# Patient Record
Sex: Female | Born: 1945 | Race: Black or African American | Hispanic: No | Marital: Married | Smoking: Never smoker
Health system: Southern US, Community
[De-identification: ages and names within clinical notes are randomized; demographics above are authoritative.]

## PROBLEM LIST (undated history)

## (undated) DIAGNOSIS — J3489 Other specified disorders of nose and nasal sinuses: Secondary | ICD-10-CM

## (undated) DIAGNOSIS — E785 Hyperlipidemia, unspecified: Secondary | ICD-10-CM

## (undated) DIAGNOSIS — M1990 Unspecified osteoarthritis, unspecified site: Secondary | ICD-10-CM

## (undated) DIAGNOSIS — Z9889 Other specified postprocedural states: Secondary | ICD-10-CM

## (undated) DIAGNOSIS — M8580 Other specified disorders of bone density and structure, unspecified site: Secondary | ICD-10-CM

## (undated) DIAGNOSIS — E0590 Thyrotoxicosis, unspecified without thyrotoxic crisis or storm: Secondary | ICD-10-CM

## (undated) DIAGNOSIS — G5601 Carpal tunnel syndrome, right upper limb: Secondary | ICD-10-CM

## (undated) DIAGNOSIS — I358 Other nonrheumatic aortic valve disorders: Secondary | ICD-10-CM

## (undated) DIAGNOSIS — R112 Nausea with vomiting, unspecified: Secondary | ICD-10-CM

## (undated) DIAGNOSIS — H269 Unspecified cataract: Secondary | ICD-10-CM

## (undated) HISTORY — DX: Thyrotoxicosis, unspecified without thyrotoxic crisis or storm: E05.90

## (undated) HISTORY — PX: EYE SURGERY: SHX253

## (undated) HISTORY — DX: Hyperlipidemia, unspecified: E78.5

## (undated) HISTORY — DX: Unspecified cataract: H26.9

## (undated) HISTORY — DX: Other nonrheumatic aortic valve disorders: I35.8

## (undated) HISTORY — DX: Unspecified osteoarthritis, unspecified site: M19.90

## (undated) HISTORY — PX: ABDOMINAL HYSTERECTOMY: SHX81

## (undated) HISTORY — DX: Other specified disorders of bone density and structure, unspecified site: M85.80

## (undated) HISTORY — PX: DENTAL SURGERY: SHX609

---

## 1974-07-14 HISTORY — PX: TONSILLECTOMY: SUR1361

## 2000-07-14 HISTORY — PX: COLONOSCOPY: SHX174

## 2003-07-15 HISTORY — PX: COLONOSCOPY: SHX174

## 2003-07-15 HISTORY — PX: ESOPHAGOGASTRODUODENOSCOPY: SHX1529

## 2006-07-14 HISTORY — PX: TOTAL ABDOMINAL HYSTERECTOMY W/ BILATERAL SALPINGOOPHORECTOMY: SHX83

## 2008-07-14 HISTORY — PX: BREAST EXCISIONAL BIOPSY: SUR124

## 2008-07-14 HISTORY — PX: BREAST BIOPSY: SHX20

## 2011-07-15 HISTORY — PX: CATARACT EXTRACTION: SUR2

## 2011-08-13 LAB — COMPREHENSIVE METABOLIC PANEL
Albumin: 4
BUN: 17 mg/dL (ref 4–21)
Calcium: 9.6 mg/dL
Creat: 0.91
Potassium: 4.3 mmol/L
Total Bilirubin: 0.4 mg/dL
Total Protein: 6.7 g/dL

## 2011-08-13 LAB — LIPID PANEL
Cholesterol, Total: 208
Direct LDL: 126
HDL: 56 mg/dL (ref 35–70)
Triglycerides: 128

## 2011-08-13 LAB — LACTATE DEHYDROGENASE: LDH: 172 U/L (ref 80–250)

## 2011-08-13 LAB — TSH: TSH: 1.31

## 2011-08-13 LAB — GAMMA GT: GGT: 19 U/L (ref 10–52)

## 2011-08-13 LAB — CBC
Hemoglobin: 13.6 g/dL (ref 12.0–16.0)
RBC: 4.3
WBC: 6.1

## 2011-08-13 LAB — GLUCOSE, RANDOM

## 2011-11-25 ENCOUNTER — Encounter: Payer: Self-pay | Admitting: Family Medicine

## 2011-11-25 ENCOUNTER — Ambulatory Visit (INDEPENDENT_AMBULATORY_CARE_PROVIDER_SITE_OTHER): Payer: Managed Care, Other (non HMO) | Admitting: Family Medicine

## 2011-11-25 VITALS — BP 130/84 | HR 68 | Temp 98.1°F | Ht 59.5 in | Wt 139.8 lb

## 2011-11-25 DIAGNOSIS — E785 Hyperlipidemia, unspecified: Secondary | ICD-10-CM

## 2011-11-25 DIAGNOSIS — M85852 Other specified disorders of bone density and structure, left thigh: Secondary | ICD-10-CM | POA: Insufficient documentation

## 2011-11-25 DIAGNOSIS — M810 Age-related osteoporosis without current pathological fracture: Secondary | ICD-10-CM

## 2011-11-25 DIAGNOSIS — M8580 Other specified disorders of bone density and structure, unspecified site: Secondary | ICD-10-CM | POA: Insufficient documentation

## 2011-11-25 NOTE — Progress Notes (Signed)
Subjective:    Patient ID: Kelsey Yoder, female    DOB: 20-Sep-1945, 66 y.o.   MRN: [id number]  HPI CC: new pt, establish  Moved from [place] 05/2011.  Prior saw Dr. Jocelyn Lamer, requested records to be faxed.  Will sign ROI form.  Reviewed records she brings today - mainly recent blood work and recent mammogram studies.  No questions or concerns today.  Osteoporosis- takes calcium and vitamin D - see med list.  On estrogen since 2008.  On actonel for last 5 years as well.  No hip pain.  No fractures in past.  No jaw or hip pain.  Caffeine: occasional Lives alone, no pets Occupation: retired, was in Research scientist (life sciences) Edu: master's Activity: goes to gym 3-4x/wk Diet: some water, fruits and vegetables daily  Preventative: Well woman with OBGYN.  Has not established with OBGYN yet.  Last well woman 08/2011.   Last mamogram 08/14/2011, normal. H/o abnormal paps, had hysterectomy 2008. Last CPE was done 2 yrs ago. Tetanus shot - done 2010. Colonoscopy x2 - unsure when done, told didn't need for 15 yrs.  thinks last checked 2010.  Medications and allergies reviewed and updated in chart.  Past histories reviewed and updated if relevant as below. There is no problem list on file for this patient.  Past Medical History  Diagnosis Date  . Arthritis     finger pain  . History of chicken pox   . Heart murmur     s/p normal ETT per pt  . Mild hyperlipidemia   . Osteoporosis     due for DEXA scan 2014, currently on actonel and estrogen   Past Surgical History  Procedure Date  . Breast biopsy 2010    Right; negative  . Tonsillectomy 1976  . Total abdominal hysterectomy 2008    abnormal paps   History  Substance Use Topics  . Smoking status: Never Smoker   . Smokeless tobacco: Never Used  . Alcohol Use: No   Family History  Problem Relation Age of Onset  . Diabetes Mother   . Diabetes Father   . Stroke Sister   . Lupus Sister     x2  . Diabetes Brother   .  Cancer Brother     duodenal cancer, prostate cancer  . Stroke Maternal Grandmother   . Stroke Maternal Grandfather   . Coronary artery disease Neg Hx    No Known Allergies Current Outpatient Prescriptions on File Prior to Visit  Medication Sig Dispense Refill  . Calcium Carb-Cholecalciferol 600-800 MG-UNIT TABS Take 1 tablet by mouth daily.      Marland Kitchen estrogens conjugated, synthetic B, (ENJUVIA) 0.625 MG tablet Take 0.625 mg by mouth daily.         Review of Systems  Constitutional: Negative for fever, chills, activity change, appetite change, fatigue and unexpected weight change.  HENT: Negative for hearing loss and neck pain.   Eyes: Negative for visual disturbance.  Respiratory: Negative for cough, chest tightness, shortness of breath and wheezing.   Cardiovascular: Negative for chest pain, palpitations and leg swelling.  Gastrointestinal: Negative for nausea, vomiting, abdominal pain, diarrhea, constipation, blood in stool and abdominal distention.  Genitourinary: Negative for hematuria and difficulty urinating.  Musculoskeletal: Negative for myalgias and arthralgias.  Skin: Negative for rash.  Neurological: Negative for dizziness, seizures, syncope and headaches.  Hematological: Does not bruise/bleed easily.  Psychiatric/Behavioral: Negative for dysphoric mood. The patient is not nervous/anxious.        Objective:  Physical Exam  Nursing note and vitals reviewed. Constitutional: She is oriented to person, place, and time. She appears well-developed and well-nourished. No distress.  HENT:  Head: Normocephalic and atraumatic.  Right Ear: External ear normal.  Left Ear: External ear normal.  Nose: Nose normal.  Mouth/Throat: Oropharynx is clear and moist. No oropharyngeal exudate.  Eyes: Conjunctivae and EOM are normal. Pupils are equal, round, and reactive to light. No scleral icterus.  Neck: Normal range of motion. Neck supple. Carotid bruit is not present.  Cardiovascular:  Normal rate, regular rhythm, normal heart sounds and intact distal pulses.   No murmur heard. Pulses:      Radial pulses are 2+ on the right side, and 2+ on the left side.  Pulmonary/Chest: Effort normal and breath sounds normal. No respiratory distress. She has no wheezes. She has no rales.  Abdominal: Soft. Bowel sounds are normal. She exhibits no distension and no mass. There is no tenderness. There is no rebound and no guarding.  Musculoskeletal: Normal range of motion. She exhibits no edema.  Lymphadenopathy:    She has no cervical adenopathy.  Neurological: She is alert and oriented to person, place, and time.       CN grossly intact, station and gait intact  Skin: Skin is warm and dry. No rash noted.  Psychiatric: She has a normal mood and affect. Her behavior is normal. Judgment and thought content normal.      Assessment & Plan:

## 2011-11-25 NOTE — Assessment & Plan Note (Signed)
Controlled on fish oil. Total chol 208 on last check 07/2011.  Asked to scan results into system.

## 2011-11-25 NOTE — Assessment & Plan Note (Addendum)
On bisphosphonate and estrogen for last 5 years.  States h/o osteopenia, not osteoporosis. Will request records from prior PCP.  May be able to come off estrogens. Discussed risks and common side effects of both meds.

## 2011-11-25 NOTE — Patient Instructions (Addendum)
Great to meet you today, call us with questions. Return in 6 months for physical, prior fasting for blood work. We will request records from Dr. Devoria Glassing in [place]. continue meds as up to now.

## 2011-12-27 ENCOUNTER — Encounter: Payer: Self-pay | Admitting: Family Medicine

## 2011-12-27 DIAGNOSIS — I358 Other nonrheumatic aortic valve disorders: Secondary | ICD-10-CM | POA: Insufficient documentation

## 2011-12-27 DIAGNOSIS — E559 Vitamin D deficiency, unspecified: Secondary | ICD-10-CM | POA: Insufficient documentation

## 2012-01-05 ENCOUNTER — Telehealth: Payer: Self-pay | Admitting: Family Medicine

## 2012-01-05 NOTE — Telephone Encounter (Signed)
Caller: Mao/Patient; PCP: Eustaquio Boyden; CB#: [phone]; ; ; Call regarding Bite On Leg;  Onset- 01/02/2012 Afebrile. Pt has a bite on her left  leg that is red, swollen, and tender to touch. She states it is ? the  size of a  small lemon in swelling. Only small portion on top is red. She unsure of what bit her. Emergent s/s of Bites and stings protocol r/o. Pt to see provider within 24hrs. Appt scheduled for 12:15 tomorrow with Dr. Sharen Hones. Pt states she is up to date with Tetanus.

## 2012-01-05 NOTE — Telephone Encounter (Signed)
Noted. Will see tomorrow. 

## 2012-01-06 ENCOUNTER — Ambulatory Visit (INDEPENDENT_AMBULATORY_CARE_PROVIDER_SITE_OTHER): Payer: Managed Care, Other (non HMO) | Admitting: Family Medicine

## 2012-01-06 ENCOUNTER — Encounter: Payer: Self-pay | Admitting: Family Medicine

## 2012-01-06 VITALS — BP 125/75 | HR 97 | Temp 98.1°F | Wt 138.0 lb

## 2012-01-06 DIAGNOSIS — R600 Localized edema: Secondary | ICD-10-CM | POA: Insufficient documentation

## 2012-01-06 DIAGNOSIS — R609 Edema, unspecified: Secondary | ICD-10-CM

## 2012-01-06 MED ORDER — CEPHALEXIN 500 MG PO CAPS: 500.0000 mg | ORAL_CAPSULE | Freq: Three times a day (TID) | ORAL | Status: AC

## 2012-01-06 NOTE — Assessment & Plan Note (Addendum)
After recent bug bites, anticipate mosquitoes as around standing water. Discussed draining standing water. I am more suspicious for localized inflammatory reaction to bug bite than for infection/cellulitis. Discussed this.  Supportive care as per instructions Red flags to watch discussed, if worsening, fill keflex course (WASP provided).  Return if needed (ie worsening despite abx)

## 2012-01-06 NOTE — Patient Instructions (Signed)
I don't think there's infection, but more likely inflammation after bug bites. Treat by elevating leg as much as able during day, cold compresses or ice pack to leg, and ibuprofen for swelling. If spreading redness, pain worse, or warmth, start antibiotic. If worsening instead of improving despite above, return to see [place].

## 2012-01-06 NOTE — Progress Notes (Signed)
  Subjective:    Patient ID: Kelsey Yoder, female    DOB: [birth year], 66 y.o.   MRN: [id number]  HPI CC: leg swelling  hsa been working outside in yard  Friday night noticed bites on lower legs, wonders if mosquito bites.  Some itchy, now more sore and tight/swollen.  L > R lower legs.  No warmth, mild redness.    Does have standing water in yard in form of coy pond - thinking about getting rid of this  No fevers/chills.  Review of Systems Per HPI    Objective:   Physical Exam  Nursing note and vitals reviewed. Constitutional: She appears well-developed and well-nourished. No distress.  Cardiovascular:  Pulses:      Dorsalis pedis pulses are 2+ on the right side, and 2+ on the left side.       Posterior tibial pulses are 2+ on the right side, and 2+ on the left side.  Musculoskeletal: She exhibits no edema.  Skin: Skin is warm and dry. There is erythema.       Mild erythema and nonpitting edema of left lower leg anterior shin.  No calor.  Mild tender to palpation.       Assessment & Plan:

## 2012-05-19 ENCOUNTER — Other Ambulatory Visit: Payer: Self-pay | Admitting: Family Medicine

## 2012-05-19 DIAGNOSIS — M8580 Other specified disorders of bone density and structure, unspecified site: Secondary | ICD-10-CM

## 2012-05-19 DIAGNOSIS — E559 Vitamin D deficiency, unspecified: Secondary | ICD-10-CM

## 2012-05-19 DIAGNOSIS — E785 Hyperlipidemia, unspecified: Secondary | ICD-10-CM

## 2012-05-24 ENCOUNTER — Other Ambulatory Visit (INDEPENDENT_AMBULATORY_CARE_PROVIDER_SITE_OTHER): Payer: Managed Care, Other (non HMO)

## 2012-05-24 DIAGNOSIS — E559 Vitamin D deficiency, unspecified: Secondary | ICD-10-CM

## 2012-05-24 DIAGNOSIS — E785 Hyperlipidemia, unspecified: Secondary | ICD-10-CM

## 2012-05-24 DIAGNOSIS — M949 Disorder of cartilage, unspecified: Secondary | ICD-10-CM

## 2012-05-24 DIAGNOSIS — M8580 Other specified disorders of bone density and structure, unspecified site: Secondary | ICD-10-CM

## 2012-05-24 LAB — BASIC METABOLIC PANEL
Chloride: 105 mEq/L (ref 96–112)
GFR: 79 mL/min (ref >60.00)
Glucose, Bld: 90 mg/dL (ref 70–99)
Potassium: 4 mEq/L (ref 3.5–5.1)
Sodium: 140 mEq/L (ref 135–145)

## 2012-05-24 LAB — LIPID PANEL: Total CHOL/HDL Ratio: 5

## 2012-05-24 LAB — LDL CHOLESTEROL, DIRECT: Direct LDL: 151.4 mg/dL

## 2012-05-27 ENCOUNTER — Ambulatory Visit: Payer: Managed Care, Other (non HMO) | Admitting: Family Medicine

## 2012-05-27 ENCOUNTER — Encounter: Payer: Self-pay | Admitting: Family Medicine

## 2012-05-27 ENCOUNTER — Ambulatory Visit (INDEPENDENT_AMBULATORY_CARE_PROVIDER_SITE_OTHER): Payer: Managed Care, Other (non HMO) | Admitting: Family Medicine

## 2012-05-27 VITALS — BP 136/80 | HR 60 | Temp 97.9°F | Ht 59.5 in | Wt 138.2 lb

## 2012-05-27 DIAGNOSIS — Z23 Encounter for immunization: Secondary | ICD-10-CM

## 2012-05-27 DIAGNOSIS — E785 Hyperlipidemia, unspecified: Secondary | ICD-10-CM

## 2012-05-27 DIAGNOSIS — E559 Vitamin D deficiency, unspecified: Secondary | ICD-10-CM

## 2012-05-27 DIAGNOSIS — M949 Disorder of cartilage, unspecified: Secondary | ICD-10-CM

## 2012-05-27 DIAGNOSIS — Z0001 Encounter for general adult medical examination with abnormal findings: Secondary | ICD-10-CM | POA: Insufficient documentation

## 2012-05-27 DIAGNOSIS — M8580 Other specified disorders of bone density and structure, unspecified site: Secondary | ICD-10-CM

## 2012-05-27 DIAGNOSIS — Z0000 Encounter for general adult medical examination without abnormal findings: Secondary | ICD-10-CM

## 2012-05-27 NOTE — Assessment & Plan Note (Signed)
Discussed dietary changes to lower LDL.

## 2012-05-27 NOTE — Assessment & Plan Note (Signed)
On actonel. Stopped estrogen, was on for 5 yrs prior. Due for DEXA 2014.

## 2012-05-27 NOTE — Assessment & Plan Note (Signed)
Good levels, continue regimen.

## 2012-05-27 NOTE — Progress Notes (Signed)
Subjective:    Patient ID: Kelsey Yoder, female    DOB: 1946/02/29, 66 y.o.   MRN: [id number]  HPI CC: annual exam  Osteopenia - taking calcium and vitamin D.  Also on actonel.  Due for DEXA scan 2014.  Previously on estrogen, stopped when unable to get prior formulation of estrogen.  Preventative: Well woman with OBGYN. Has not established with OBGYN yet. Last well woman 08/2011.  Last mamogram 08/14/2011, normal.  H/o abnormal paps, had hysterectomy 2008. Continues to get pap smears.  Tetanus shot - done 2010.  Flu shot - today. Colonoscopy - unsure when done, told didn't need for 15 yrs. thinks last checked 2010.   Denies depression/anhedonia, sadness.  Seat belt use discussed  Medications and allergies reviewed and updated in chart.  Past histories reviewed and updated if relevant as below. Patient Active Problem List  Diagnosis  . Mild hyperlipidemia  . Osteopenia  . Aortic valve sclerosis  . Vitamin d deficiency  . Leg edema   Past Medical History  Diagnosis Date  . Arthritis     + ANA, "borderline rheumatism"  . History of chicken pox   . Aortic valve sclerosis     s/p normal ETT per pt  . Mild hyperlipidemia   . Osteopenia     due for DEXA scan 2014, currently on actonel and estrogen  . Vitamin D deficiency   . Rhinitis    Past Surgical History  Procedure Date  . Breast biopsy 2010    Right; benign fibrocystic  changes  . Tonsillectomy 1976  . Total abdominal hysterectomy w/ bilateral salpingoophorectomy 2008    abnormal cervical cells, has seen GYN  . Colonoscopy 2008    next due 2015 per reports  . US echocardiography 04/2009    nl sys fxn, aortic valve sclerosis,mild impaired diastolic dysfunction  . Cataract extraction 12/2011    left then right   History  Substance Use Topics  . Smoking status: Never Smoker   . Smokeless tobacco: Never Used  . Alcohol Use: No   Family History  Problem Relation Age of Onset  . Diabetes Mother    double amputee  . Diabetes Father   . Stroke Sister   . Lupus Sister     x2  . Diabetes Brother   . Cancer Brother     duodenal cancer, prostate cancer  . Stroke Maternal Grandmother   . Stroke Maternal Grandfather   . Coronary artery disease Neg Hx   . Heart disease Mother 75    CHF   No Known Allergies Current Outpatient Prescriptions on File Prior to Visit  Medication Sig Dispense Refill  . Calcium Carb-Cholecalciferol 600-800 MG-UNIT TABS Take 1 tablet by mouth daily.      . Cholecalciferol (VITAMIN D3) 5000 UNITS CAPS Take 1 capsule by mouth daily.      . Cyanocobalamin (VITAMIN B 12 PO) Take 1,000 mcg by mouth daily.      . fish oil-omega-3 fatty acids 1000 MG capsule Take 1 g by mouth daily.      . Multiple Vitamin (MULTIVITAMIN) tablet Take 1 tablet by mouth daily.      . risedronate (ACTONEL) 150 MG tablet Take 150 mg by mouth every 30 (thirty) days. with water on empty stomach, nothing by mouth or lie down for next 30 minutes.         Review of Systems  Constitutional: Negative for fever, chills, activity change, appetite change, fatigue and unexpected weight change.  HENT: Negative for hearing loss and neck pain.   Eyes: Negative for visual disturbance.  Respiratory: Negative for cough, chest tightness, shortness of breath and wheezing.   Cardiovascular: Negative for chest pain, palpitations and leg swelling.  Gastrointestinal: Negative for nausea, vomiting, abdominal pain, diarrhea, constipation, blood in stool and abdominal distention.  Genitourinary: Negative for hematuria and difficulty urinating.  Musculoskeletal: Negative for myalgias and arthralgias.  Skin: Negative for rash.  Neurological: Negative for dizziness, seizures, syncope and headaches.  Hematological: Does not bruise/bleed easily.  Psychiatric/Behavioral: Negative for dysphoric mood. The patient is not nervous/anxious.        Objective:   Physical Exam  Nursing note and vitals  reviewed. Constitutional: She is oriented to person, place, and time. She appears well-developed and well-nourished. No distress.  HENT:  Head: Normocephalic and atraumatic.  Right Ear: Hearing, tympanic membrane, external ear and ear canal normal.  Left Ear: Hearing, tympanic membrane, external ear and ear canal normal.  Nose: Nose normal.  Mouth/Throat: Oropharynx is clear and moist. No oropharyngeal exudate.  Eyes: Conjunctivae normal and EOM are normal. Pupils are equal, round, and reactive to light. No scleral icterus.  Neck: Normal range of motion. Neck supple. Carotid bruit is not present.  Cardiovascular: Normal rate, regular rhythm, normal heart sounds and intact distal pulses.   No murmur heard. Pulses:      Radial pulses are 2+ on the right side, and 2+ on the left side.  Pulmonary/Chest: Effort normal and breath sounds normal. No respiratory distress. She has no wheezes. She has no rales.       Breast exam deferred for OBGYN  Abdominal: Soft. Bowel sounds are normal. She exhibits no distension and no mass. There is no tenderness. There is no rebound and no guarding.  Genitourinary:       Deferred for OBGYN  Musculoskeletal: Normal range of motion. She exhibits no edema.  Lymphadenopathy:    She has no cervical adenopathy.  Neurological: She is alert and oriented to person, place, and time.       CN grossly intact, station and gait intact  Skin: Skin is warm and dry. No rash noted.  Psychiatric: She has a normal mood and affect. Her behavior is normal. Judgment and thought content normal.       Assessment & Plan:

## 2012-05-27 NOTE — Patient Instructions (Signed)
Flu shot today. Schedule appointment for well woman with OBGYN for early next year, as well as for mammogram. Due for bone density scan next year.

## 2012-05-27 NOTE — Assessment & Plan Note (Signed)
Preventative protocols reviewed and updated unless pt declined. Discussed healthy diet and lifestyle.  

## 2012-09-13 ENCOUNTER — Encounter: Payer: Self-pay | Admitting: Obstetrics & Gynecology

## 2012-09-13 ENCOUNTER — Ambulatory Visit (INDEPENDENT_AMBULATORY_CARE_PROVIDER_SITE_OTHER): Payer: Private Health Insurance - Indemnity | Admitting: Obstetrics & Gynecology

## 2012-09-13 VITALS — BP 130/69 | HR 72 | Ht 59.75 in | Wt 138.0 lb

## 2012-09-13 DIAGNOSIS — M8580 Other specified disorders of bone density and structure, unspecified site: Secondary | ICD-10-CM

## 2012-09-13 DIAGNOSIS — Z01419 Encounter for gynecological examination (general) (routine) without abnormal findings: Secondary | ICD-10-CM

## 2012-09-13 DIAGNOSIS — Z1231 Encounter for screening mammogram for malignant neoplasm of breast: Secondary | ICD-10-CM

## 2012-09-13 MED ORDER — RISEDRONATE SODIUM 150 MG PO TABS: 150.0000 mg | ORAL_TABLET | ORAL | Status: DC

## 2012-09-13 NOTE — Progress Notes (Signed)
  Subjective:    Kelsey Yoder is a 67 y.o. female who presents to discuss hormone replacement therapy. Patient has been on  hormone replacement therapy due to hysterectomy with BSO. The patient is not currently taking hormone replacement therapy. Patient denies post-menopausal vaginal bleeding. The patient is not sexually active. No other GYN concerns, no menopausal symptoms.  Previous hormone therapy:  Estrogen: Premarin 0.625 mg daily Reason for starting HRT: hysterectomy with BSO Bleeding in past on HRT: None  The following portions of the patient's history were reviewed and updated as appropriate: allergies, current medications, past family history, past medical history, past social history, past surgical history and problem list.  Review of Systems Pertinent items are noted in HPI.    Objective:   BP 130/69  Pulse 72  Ht 4' 11.75" (1.518 m)  Wt 138 lb (62.596 kg)  BMI 27.16 kg/m2 GENERAL: Well-developed, well-nourished female in no acute distress.  HEENT: Normocephalic, atraumatic. Sclerae anicteric.  NECK: Supple. Normal thyroid.  LUNGS: Clear to auscultation bilaterally.  HEART: Regular rate and rhythm. BREASTS: Symmetric in size. No masses, skin changes, nipple drainage, or lymphadenopathy. ABDOMEN: Soft, nontender, nondistended. No organomegaly. PELVIC: Normal external female genitalia. Vagina is with significant atrophy, well-healed vaginal cuff.  Normal discharge. No masses palpated on bimanual exam EXTREMITIES: No cyanosis, clubbing, or edema, 2+ distal pulses.   Assessment:   Normal annual gynecologic exam  Hormone replacement therapy in 67 y.o. woman.    Plan:  No need for pap, TAH/BSO not done for cancer (done for abnormal pap smears); normal yearly paps of vaginal cuff since 2008. Mammogram scheduled; also scheduled DEXA scan given history of osteopenia; Actonel refilled as per patient's request Patient will stop HRT, no refills given, will monitor  symptoms and treat if needed with nonhormonal methods Routine preventative health maintenance measures emphasized

## 2012-09-13 NOTE — Patient Instructions (Signed)
Preventive Care for Adults, Female A healthy lifestyle and preventive care can promote health and wellness. Preventive health guidelines for women include the following key practices.  A routine yearly physical is a good way to check with your caregiver about your health and preventive screening. It is a chance to share any concerns and updates on your health, and to receive a thorough exam.  Visit your dentist for a routine exam and preventive care every 6 months. Brush your teeth twice a day and floss once a day. Good oral hygiene prevents tooth decay and gum disease.  The frequency of eye exams is based on your age, health, family medical history, use of contact lenses, and other factors. Follow your caregiver's recommendations for frequency of eye exams.  Eat a healthy diet. Foods like vegetables, fruits, whole grains, low-fat dairy products, and lean protein foods contain the nutrients you need without too many calories. Decrease your intake of foods high in solid fats, added sugars, and salt. Eat the right amount of calories for you.Get information about a proper diet from your caregiver, if necessary.  Regular physical exercise is one of the most important things you can do for your health. Most adults should get at least 150 minutes of moderate-intensity exercise (any activity that increases your heart rate and causes you to sweat) each week. In addition, most adults need muscle-strengthening exercises on 2 or more days a week.  Maintain a healthy weight. The body mass index (BMI) is a screening tool to identify possible weight problems. It provides an estimate of body fat based on height and weight. Your caregiver can help determine your BMI, and can help you achieve or maintain a healthy weight.For adults 20 years and older:  A BMI below 18.5 is considered underweight.  A BMI of 18.5 to 24.9 is normal.  A BMI of 25 to 29.9 is considered overweight.  A BMI of 30 and above is  considered obese.  Maintain normal blood lipids and cholesterol levels by exercising and minimizing your intake of saturated fat. Eat a balanced diet with plenty of fruit and vegetables. Blood tests for lipids and cholesterol should begin at age 20 and be repeated every 5 years. If your lipid or cholesterol levels are high, you are over 50, or you are at high risk for heart disease, you may need your cholesterol levels checked more frequently.Ongoing high lipid and cholesterol levels should be treated with medicines if diet and exercise are not effective.  If you smoke, find out from your caregiver how to quit. If you do not use tobacco, do not start.  If you are pregnant, do not drink alcohol. If you are breastfeeding, be very cautious about drinking alcohol. If you are not pregnant and choose to drink alcohol, do not exceed 1 drink per day. One drink is considered to be 12 ounces (355 mL) of beer, 5 ounces (148 mL) of wine, or 1.5 ounces (44 mL) of liquor.  Avoid use of street drugs. Do not share needles with anyone. Ask for help if you need support or instructions about stopping the use of drugs.  High blood pressure causes heart disease and increases the risk of stroke. Your blood pressure should be checked at least every 1 to 2 years. Ongoing high blood pressure should be treated with medicines if weight loss and exercise are not effective.  If you are 55 to 65 years old, ask your caregiver if you should take aspirin to prevent strokes.  Diabetes   screening involves taking a blood sample to check your fasting blood sugar level. This should be done once every 3 years, after age 45, if you are within normal weight and without risk factors for diabetes. Testing should be considered at a younger age or be carried out more frequently if you are overweight and have at least 1 risk factor for diabetes.  Breast cancer screening is essential preventive care for women. You should practice "breast  self-awareness." This means understanding the normal appearance and feel of your breasts and may include breast self-examination. Any changes detected, no matter how small, should be reported to a caregiver. Women in their 20s and 30s should have a clinical breast exam (CBE) by a caregiver as part of a regular health exam every 1 to 3 years. After age 40, women should have a CBE every year. Starting at age 40, women should consider having a mammography (breast X-ray test) every year. Women who have a family history of breast cancer should talk to their caregiver about genetic screening. Women at a high risk of breast cancer should talk to their caregivers about having magnetic resonance imaging (MRI) and a mammography every year.  The Pap test is a screening test for cervical cancer. A Pap test can show cell changes on the cervix that might become cervical cancer if left untreated. A Pap test is a procedure in which cells are obtained and examined from the lower end of the uterus (cervix).  Women should have a Pap test starting at age 21.  Between ages 21 and 29, Pap tests should be repeated every 2 years.  Beginning at age 30, you should have a Pap test every 3 years as long as the past 3 Pap tests have been normal.  Some women have medical problems that increase the chance of getting cervical cancer. Talk to your caregiver about these problems. It is especially important to talk to your caregiver if a new problem develops soon after your last Pap test. In these cases, your caregiver may recommend more frequent screening and Pap tests.  The above recommendations are the same for women who have or have not gotten the vaccine for human papillomavirus (HPV).  If you had a hysterectomy for a problem that was not cancer or a condition that could lead to cancer, then you no longer need Pap tests. Even if you no longer need a Pap test, a regular exam is a good idea to make sure no other problems are  starting.  If you are between ages 65 and 70, and you have had normal Pap tests going back 10 years, you no longer need Pap tests. Even if you no longer need a Pap test, a regular exam is a good idea to make sure no other problems are starting.  If you have had past treatment for cervical cancer or a condition that could lead to cancer, you need Pap tests and screening for cancer for at least 20 years after your treatment.  If Pap tests have been discontinued, risk factors (such as a new sexual partner) need to be reassessed to determine if screening should be resumed.  The HPV test is an additional test that may be used for cervical cancer screening. The HPV test looks for the virus that can cause the cell changes on the cervix. The cells collected during the Pap test can be tested for HPV. The HPV test could be used to screen women aged 30 years and older, and should   be used in women of any age who have unclear Pap test results. After the age of 30, women should have HPV testing at the same frequency as a Pap test.  Colorectal cancer can be detected and often prevented. Most routine colorectal cancer screening begins at the age of 50 and continues through age 75. However, your caregiver may recommend screening at an earlier age if you have risk factors for colon cancer. On a yearly basis, your caregiver may provide home test kits to check for hidden blood in the stool. Use of a small camera at the end of a tube, to directly examine the colon (sigmoidoscopy or colonoscopy), can detect the earliest forms of colorectal cancer. Talk to your caregiver about this at age 50, when routine screening begins. Direct examination of the colon should be repeated every 5 to 10 years through age 75, unless early forms of pre-cancerous polyps or small growths are found.  Hepatitis C blood testing is recommended for all people born from 1945 through 1965 and any individual with known risks for hepatitis C.  Practice  safe sex. Use condoms and avoid high-risk sexual practices to reduce the spread of sexually transmitted infections (STIs). STIs include gonorrhea, chlamydia, syphilis, trichomonas, herpes, HPV, and human immunodeficiency virus (HIV). Herpes, HIV, and HPV are viral illnesses that have no cure. They can result in disability, cancer, and death. Sexually active women aged 25 and younger should be checked for chlamydia. Older women with new or multiple partners should also be tested for chlamydia. Testing for other STIs is recommended if you are sexually active and at increased risk.  Osteoporosis is a disease in which the bones lose minerals and strength with aging. This can result in serious bone fractures. The risk of osteoporosis can be identified using a bone density scan. Women ages 65 and over and women at risk for fractures or osteoporosis should discuss screening with their caregivers. Ask your caregiver whether you should take a calcium supplement or vitamin D to reduce the rate of osteoporosis.  Menopause can be associated with physical symptoms and risks. Hormone replacement therapy is available to decrease symptoms and risks. You should talk to your caregiver about whether hormone replacement therapy is right for you.  Use sunscreen with sun protection factor (SPF) of 30 or more. Apply sunscreen liberally and repeatedly throughout the day. You should seek shade when your shadow is shorter than you. Protect yourself by wearing long sleeves, pants, a wide-brimmed hat, and sunglasses year round, whenever you are outdoors.  Once a month, do a whole body skin exam, using a mirror to look at the skin on your back. Notify your caregiver of new moles, moles that have irregular borders, moles that are larger than a pencil eraser, or moles that have changed in shape or color.  Stay current with required immunizations.  Influenza. You need a dose every fall (or winter). The composition of the flu vaccine  changes each year, so being vaccinated once is not enough.  Pneumococcal polysaccharide. You need 1 to 2 doses if you smoke cigarettes or if you have certain chronic medical conditions. You need 1 dose at age 65 (or older) if you have never been vaccinated.  Tetanus, diphtheria, pertussis (Tdap, Td). Get 1 dose of Tdap vaccine if you are younger than age 65, are over 65 and have contact with an infant, are a healthcare worker, are pregnant, or simply want to be protected from whooping cough. After that, you need a Td   booster dose every 10 years. Consult your caregiver if you have not had at least 3 tetanus and diphtheria-containing shots sometime in your life or have a deep or dirty wound.  HPV. You need this vaccine if you are a woman age 26 or younger. The vaccine is given in 3 doses over 6 months.  Measles, mumps, rubella (MMR). You need at least 1 dose of MMR if you were born in 1957 or later. You may also need a second dose.  Meningococcal. If you are age 19 to 21 and a first-year college student living in a residence hall, or have one of several medical conditions, you need to get vaccinated against meningococcal disease. You may also need additional booster doses.  Zoster (shingles). If you are age 60 or older, you should get this vaccine.  Varicella (chickenpox). If you have never had chickenpox or you were vaccinated but received only 1 dose, talk to your caregiver to find out if you need this vaccine.  Hepatitis A. You need this vaccine if you have a specific risk factor for hepatitis A virus infection or you simply wish to be protected from this disease. The vaccine is usually given as 2 doses, 6 to 18 months apart.  Hepatitis B. You need this vaccine if you have a specific risk factor for hepatitis B virus infection or you simply wish to be protected from this disease. The vaccine is given in 3 doses, usually over 6 months. Preventive Services / Frequency Ages 19 to 39  Blood  pressure check.** / Every 1 to 2 years.  Lipid and cholesterol check.** / Every 5 years beginning at age 20.  Clinical breast exam.** / Every 3 years for women in their 20s and 30s.  Pap test.** / Every 2 years from ages 21 through 29. Every 3 years starting at age 30 through age 65 or 70 with a history of 3 consecutive normal Pap tests.  HPV screening.** / Every 3 years from ages 30 through ages 65 to 70 with a history of 3 consecutive normal Pap tests.  Hepatitis C blood test.** / For any individual with known risks for hepatitis C.  Skin self-exam. / Monthly.  Influenza immunization.** / Every year.  Pneumococcal polysaccharide immunization.** / 1 to 2 doses if you smoke cigarettes or if you have certain chronic medical conditions.  Tetanus, diphtheria, pertussis (Tdap, Td) immunization. / A one-time dose of Tdap vaccine. After that, you need a Td booster dose every 10 years.  HPV immunization. / 3 doses over 6 months, if you are 26 and younger.  Measles, mumps, rubella (MMR) immunization. / You need at least 1 dose of MMR if you were born in 1957 or later. You may also need a second dose.  Meningococcal immunization. / 1 dose if you are age 19 to 21 and a first-year college student living in a residence hall, or have one of several medical conditions, you need to get vaccinated against meningococcal disease. You may also need additional booster doses.  Varicella immunization.** / Consult your caregiver.  Hepatitis A immunization.** / Consult your caregiver. 2 doses, 6 to 18 months apart.  Hepatitis B immunization.** / Consult your caregiver. 3 doses usually over 6 months. Ages 40 to 64  Blood pressure check.** / Every 1 to 2 years.  Lipid and cholesterol check.** / Every 5 years beginning at age 20.  Clinical breast exam.** / Every year after age 40.  Mammogram.** / Every year beginning at age 40   and continuing for as long as you are in good health. Consult with your  caregiver.  Pap test.** / Every 3 years starting at age 30 through age 65 or 70 with a history of 3 consecutive normal Pap tests.  HPV screening.** / Every 3 years from ages 30 through ages 65 to 70 with a history of 3 consecutive normal Pap tests.  Fecal occult blood test (FOBT) of stool. / Every year beginning at age 50 and continuing until age 75. You may not need to do this test if you get a colonoscopy every 10 years.  Flexible sigmoidoscopy or colonoscopy.** / Every 5 years for a flexible sigmoidoscopy or every 10 years for a colonoscopy beginning at age 50 and continuing until age 75.  Hepatitis C blood test.** / For all people born from 1945 through 1965 and any individual with known risks for hepatitis C.  Skin self-exam. / Monthly.  Influenza immunization.** / Every year.  Pneumococcal polysaccharide immunization.** / 1 to 2 doses if you smoke cigarettes or if you have certain chronic medical conditions.  Tetanus, diphtheria, pertussis (Tdap, Td) immunization.** / A one-time dose of Tdap vaccine. After that, you need a Td booster dose every 10 years.  Measles, mumps, rubella (MMR) immunization. / You need at least 1 dose of MMR if you were born in 1957 or later. You may also need a second dose.  Varicella immunization.** / Consult your caregiver.  Meningococcal immunization.** / Consult your caregiver.  Hepatitis A immunization.** / Consult your caregiver. 2 doses, 6 to 18 months apart.  Hepatitis B immunization.** / Consult your caregiver. 3 doses, usually over 6 months. Ages 65 and over  Blood pressure check.** / Every 1 to 2 years.  Lipid and cholesterol check.** / Every 5 years beginning at age 20.  Clinical breast exam.** / Every year after age 40.  Mammogram.** / Every year beginning at age 40 and continuing for as long as you are in good health. Consult with your caregiver.  Pap test.** / Every 3 years starting at age 30 through age 65 or 70 with a 3  consecutive normal Pap tests. Testing can be stopped between 65 and 70 with 3 consecutive normal Pap tests and no abnormal Pap or HPV tests in the past 10 years.  HPV screening.** / Every 3 years from ages 30 through ages 65 or 70 with a history of 3 consecutive normal Pap tests. Testing can be stopped between 65 and 70 with 3 consecutive normal Pap tests and no abnormal Pap or HPV tests in the past 10 years.  Fecal occult blood test (FOBT) of stool. / Every year beginning at age 50 and continuing until age 75. You may not need to do this test if you get a colonoscopy every 10 years.  Flexible sigmoidoscopy or colonoscopy.** / Every 5 years for a flexible sigmoidoscopy or every 10 years for a colonoscopy beginning at age 50 and continuing until age 75.  Hepatitis C blood test.** / For all people born from 1945 through 1965 and any individual with known risks for hepatitis C.  Osteoporosis screening.** / A one-time screening for women ages 65 and over and women at risk for fractures or osteoporosis.  Skin self-exam. / Monthly.  Influenza immunization.** / Every year.  Pneumococcal polysaccharide immunization.** / 1 dose at age 65 (or older) if you have never been vaccinated.  Tetanus, diphtheria, pertussis (Tdap, Td) immunization. / A one-time dose of Tdap vaccine if you are over   65 and have contact with an infant, are a healthcare worker, or simply want to be protected from whooping cough. After that, you need a Td booster dose every 10 years.  Varicella immunization.** / Consult your caregiver.  Meningococcal immunization.** / Consult your caregiver.  Hepatitis A immunization.** / Consult your caregiver. 2 doses, 6 to 18 months apart.  Hepatitis B immunization.** / Check with your caregiver. 3 doses, usually over 6 months. ** Family history and personal history of risk and conditions may change your caregiver's recommendations. Document Released: 08/26/2001 Document Revised: 09/22/2011  Document Reviewed: 11/25/2010 [employer] Patient Information 2013 [employer].  

## 2012-10-06 ENCOUNTER — Ambulatory Visit (HOSPITAL_COMMUNITY)
Admission: RE | Admit: 2012-10-06 | Discharge: 2012-10-06 | Disposition: A | Discharge status: Home / Self Care | Payer: Private Health Insurance - Indemnity | Source: Ambulatory Visit | Attending: Obstetrics & Gynecology | Admitting: Obstetrics & Gynecology

## 2012-10-06 DIAGNOSIS — Z01419 Encounter for gynecological examination (general) (routine) without abnormal findings: Secondary | ICD-10-CM

## 2012-10-06 DIAGNOSIS — M8580 Other specified disorders of bone density and structure, unspecified site: Secondary | ICD-10-CM

## 2012-10-06 DIAGNOSIS — Z1231 Encounter for screening mammogram for malignant neoplasm of breast: Secondary | ICD-10-CM

## 2012-10-06 DIAGNOSIS — Z780 Asymptomatic menopausal state: Secondary | ICD-10-CM | POA: Insufficient documentation

## 2012-10-06 DIAGNOSIS — Z13820 Encounter for screening for osteoporosis: Secondary | ICD-10-CM | POA: Insufficient documentation

## 2012-10-13 ENCOUNTER — Telehealth: Payer: Self-pay | Admitting: *Deleted

## 2012-10-13 DIAGNOSIS — M8580 Other specified disorders of bone density and structure, unspecified site: Secondary | ICD-10-CM

## 2012-10-13 MED ORDER — RISEDRONATE SODIUM 150 MG PO TABS: 150.0000 mg | ORAL_TABLET | ORAL | Status: DC

## 2012-10-13 NOTE — Telephone Encounter (Signed)
Patient notified of results, she is already on rx for Actonel, she will continue this and her calcium supplement.

## 2012-10-13 NOTE — Telephone Encounter (Signed)
Message copied by Barbara Cower on Wed Oct 13, 2012  1:24 PM ------      Message from: Jaynie Collins A      Created: Thu Oct 07, 2012  9:26 AM       Patient has osteopenia of her hip. Please call her to recommend adequate calcium (1200 mg/day)  and Vitamin D (800 International Units/day) intake, as well as weight bearing exercises. Also needs to follow up with PCP (Dr. Sharen Hones) who may recommend medical therapy. Next bone mineral density scan is recommended in in two years.       ------

## 2013-03-04 ENCOUNTER — Ambulatory Visit (INDEPENDENT_AMBULATORY_CARE_PROVIDER_SITE_OTHER): Payer: Private Health Insurance - Indemnity | Admitting: Family Medicine

## 2013-03-04 ENCOUNTER — Encounter: Payer: Self-pay | Admitting: Family Medicine

## 2013-03-04 VITALS — BP 130/70 | HR 117 | Temp 98.2°F | Ht 59.75 in | Wt 138.2 lb

## 2013-03-04 DIAGNOSIS — M7660 Achilles tendinitis, unspecified leg: Secondary | ICD-10-CM | POA: Insufficient documentation

## 2013-03-04 DIAGNOSIS — G5601 Carpal tunnel syndrome, right upper limb: Secondary | ICD-10-CM

## 2013-03-04 DIAGNOSIS — G5600 Carpal tunnel syndrome, unspecified upper limb: Secondary | ICD-10-CM | POA: Insufficient documentation

## 2013-03-04 DIAGNOSIS — M7661 Achilles tendinitis, right leg: Secondary | ICD-10-CM

## 2013-03-04 MED ORDER — NAPROXEN 500 MG PO TABS: ORAL_TABLET | ORAL | Status: DC

## 2013-03-04 NOTE — Assessment & Plan Note (Signed)
Treat with naprosyn and handout with info and stretching exercises from [facility] pt advisor If not better with this, advised to notify us for physical therapy referral. Pt agrees with plan.

## 2013-03-04 NOTE — Patient Instructions (Addendum)
I think you have irritation of carpal tunnel on right - treat with wrist brace.  Stretching exercises provided. You have achilles tendon inflammation - treat with stretching/strengthening exercises Start naprosyn twice daily with food for 7 days then as needed. If not better - let me know for hand surgeon referral or ankle physical therapy.

## 2013-03-04 NOTE — Progress Notes (Signed)
  Subjective:    Patient ID: Kelsey Yoder, female    DOB: 10-01-1945, 67 y.o.   MRN: [id number]  HPI CC: R hand pain  Right handed.  R wrist pain - has been using R wrist brace for last 6 months.  R digit paresthesias 1st - 3rd fingers associated with shooting pain of wrist and forearm.  Worse when awakens at night - especially numbness.  Occasional weakness of R hand. No elbow or shoulder pain.  Some R neck intermittently. Advil/aleve occasionally helps. Denies inciting trauma/injury or falls.  Had been exercising more regularly. H/o CTS for years, R>L, has used brace in past. Retired, prior was Corporate treasurer.  Also with right heel pain over the last 6-7 months - points to achilles tendon.  No swelling or warmth or redness. H/o L achilles ligament tear.  Improved with time - was on crutches.  Past Medical History  Diagnosis Date  . Arthritis     + ANA, "borderline rheumatism"  . History of chicken pox   . Aortic valve sclerosis     s/p normal ETT per pt  . Mild hyperlipidemia   . Osteopenia     due for DEXA scan 2014, currently on actonel and estrogen  . Vitamin D deficiency   . Rhinitis      Review of Systems Per HPI    Objective:   Physical Exam  Nursing note and vitals reviewed. Constitutional: She appears well-developed and well-nourished. No distress.  Musculoskeletal: She exhibits no edema.  2+ rad pulses 2+ DP bilaterally  L hand - WNL R hand - No tenderness to palpation palmar or dorsal hand/joints.  No tenderness to palpation of R wrist or forearm anterior/posterior. No pain at anatomical snuffbox.  R ankle -  No ligament laxity. No pain at malleoli.  Neg squeeze test or talar tilt. Tender to palpation at lateral achilles tendon No retrocalcaneal bursa tenderness  No active synovitis  Neurological: She is alert.  Neg tinel and faintly positive phalen on right Strength intact      Assessment & Plan:

## 2013-03-04 NOTE — Assessment & Plan Note (Signed)
Anticipate deterioration of R CTS rec treat with naprosyn, wrist brace nightly, and rest. If not better with this, advised to notify us for hand surgery referral  Pt agrees with plan.

## 2013-03-18 ENCOUNTER — Telehealth: Payer: Self-pay

## 2013-03-18 DIAGNOSIS — G5601 Carpal tunnel syndrome, right upper limb: Secondary | ICD-10-CM

## 2013-03-18 DIAGNOSIS — M7661 Achilles tendinitis, right leg: Secondary | ICD-10-CM

## 2013-03-18 NOTE — Telephone Encounter (Signed)
I have placed referral to hand surgeon and physical therapy for ankle.  plz notify we will call her next week with these appointments

## 2013-03-18 NOTE — Telephone Encounter (Signed)
Pt left v/m; pt seen 03/04/13 and naproxen is not helping ankle or hand pain. Pt wants to know what to do next. ? Hand surgeon referral and or ankle PT.

## 2013-03-21 NOTE — Telephone Encounter (Signed)
Message left notifying patient.

## 2013-04-13 DIAGNOSIS — G5601 Carpal tunnel syndrome, right upper limb: Secondary | ICD-10-CM

## 2013-04-13 HISTORY — DX: Carpal tunnel syndrome, right upper limb: G56.01

## 2013-04-19 ENCOUNTER — Encounter (HOSPITAL_BASED_OUTPATIENT_CLINIC_OR_DEPARTMENT_OTHER): Payer: Self-pay | Admitting: *Deleted

## 2013-04-19 ENCOUNTER — Other Ambulatory Visit: Payer: Self-pay | Admitting: Orthopedic Surgery

## 2013-04-20 ENCOUNTER — Encounter: Payer: Self-pay | Admitting: Family Medicine

## 2013-04-25 ENCOUNTER — Encounter (HOSPITAL_BASED_OUTPATIENT_CLINIC_OR_DEPARTMENT_OTHER): Payer: Federal, State, Local not specified - PPO | Admitting: Anesthesiology

## 2013-04-25 ENCOUNTER — Encounter (HOSPITAL_BASED_OUTPATIENT_CLINIC_OR_DEPARTMENT_OTHER)
Admission: RE | Disposition: A | Discharge status: Home / Self Care | Payer: Self-pay | Source: Ambulatory Visit | Attending: Orthopedic Surgery

## 2013-04-25 ENCOUNTER — Ambulatory Visit (HOSPITAL_BASED_OUTPATIENT_CLINIC_OR_DEPARTMENT_OTHER)
Admission: RE | Admit: 2013-04-25 | Discharge: 2013-04-25 | Disposition: A | Discharge status: Home / Self Care | Payer: Federal, State, Local not specified - PPO | Source: Ambulatory Visit | Attending: Orthopedic Surgery | Admitting: Orthopedic Surgery

## 2013-04-25 ENCOUNTER — Ambulatory Visit (HOSPITAL_BASED_OUTPATIENT_CLINIC_OR_DEPARTMENT_OTHER): Payer: Federal, State, Local not specified - PPO | Admitting: Anesthesiology

## 2013-04-25 ENCOUNTER — Encounter (HOSPITAL_BASED_OUTPATIENT_CLINIC_OR_DEPARTMENT_OTHER): Payer: Self-pay | Admitting: *Deleted

## 2013-04-25 DIAGNOSIS — G5600 Carpal tunnel syndrome, unspecified upper limb: Secondary | ICD-10-CM | POA: Insufficient documentation

## 2013-04-25 HISTORY — PX: CARPAL TUNNEL RELEASE: SHX101

## 2013-04-25 HISTORY — DX: Other specified disorders of nose and nasal sinuses: J34.89

## 2013-04-25 HISTORY — DX: Other specified postprocedural states: Z98.890

## 2013-04-25 HISTORY — DX: Nausea with vomiting, unspecified: R11.2

## 2013-04-25 HISTORY — DX: Carpal tunnel syndrome, right upper limb: G56.01

## 2013-04-25 SURGERY — CARPAL TUNNEL RELEASE
Anesthesia: General | Site: Wrist | Laterality: Right | Wound class: Clean

## 2013-04-25 MED ORDER — FENTANYL CITRATE 0.05 MG/ML IJ SOLN: 25.0000 ug | INTRAMUSCULAR | Status: DC | PRN

## 2013-04-25 MED ORDER — CEFAZOLIN SODIUM-DEXTROSE 2-3 GM-% IV SOLR
2.0000 g | INTRAVENOUS | Status: AC
  Administered 2013-04-25: 2 g via INTRAVENOUS

## 2013-04-25 MED ORDER — MIDAZOLAM HCL 2 MG/2ML IJ SOLN
1.0000 mg | INTRAMUSCULAR | Status: DC | PRN
Start: 2013-04-25 — End: 2013-04-25

## 2013-04-25 MED ORDER — PROMETHAZINE HCL 25 MG/ML IJ SOLN: 6.2500 mg | INTRAMUSCULAR | Status: DC | PRN

## 2013-04-25 MED ORDER — DEXAMETHASONE SODIUM PHOSPHATE 4 MG/ML IJ SOLN
INTRAMUSCULAR | Status: DC | PRN
  Administered 2013-04-25: 8 mg via INTRAVENOUS

## 2013-04-25 MED ORDER — MIDAZOLAM HCL 5 MG/5ML IJ SOLN
INTRAMUSCULAR | Status: DC | PRN
  Administered 2013-04-25: 1 mg via INTRAVENOUS

## 2013-04-25 MED ORDER — ONDANSETRON HCL 4 MG/2ML IJ SOLN
INTRAMUSCULAR | Status: DC | PRN
  Administered 2013-04-25: 4 mg via INTRAMUSCULAR

## 2013-04-25 MED ORDER — OXYCODONE HCL 5 MG/5ML PO SOLN: 5.0000 mg | Freq: Once | ORAL | Status: AC | PRN

## 2013-04-25 MED ORDER — PROPOFOL 10 MG/ML IV BOLUS
INTRAVENOUS | Status: DC | PRN
  Administered 2013-04-25: 200 mg via INTRAVENOUS

## 2013-04-25 MED ORDER — HYDROCODONE-ACETAMINOPHEN 5-325 MG PO TABS: ORAL_TABLET | ORAL | Status: DC

## 2013-04-25 MED ORDER — FENTANYL CITRATE 0.05 MG/ML IJ SOLN: 50.0000 ug | INTRAMUSCULAR | Status: DC | PRN

## 2013-04-25 MED ORDER — FENTANYL CITRATE 0.05 MG/ML IJ SOLN: 50.0000 ug | Freq: Once | INTRAMUSCULAR | Status: DC

## 2013-04-25 MED ORDER — MIDAZOLAM HCL 2 MG/2ML IJ SOLN: 1.0000 mg | INTRAMUSCULAR | Status: DC | PRN

## 2013-04-25 MED ORDER — OXYCODONE HCL 5 MG PO TABS
5.0000 mg | ORAL_TABLET | Freq: Once | ORAL | Status: AC | PRN
  Administered 2013-04-25: 5 mg via ORAL

## 2013-04-25 MED ORDER — CHLORHEXIDINE GLUCONATE 4 % EX LIQD: 60.0000 mL | Freq: Once | CUTANEOUS | Status: DC

## 2013-04-25 MED ORDER — LACTATED RINGERS IV SOLN
INTRAVENOUS | Status: DC
  Administered 2013-04-25: 07:00:00 via INTRAVENOUS

## 2013-04-25 MED ORDER — LIDOCAINE HCL (CARDIAC) 20 MG/ML IV SOLN
INTRAVENOUS | Status: DC | PRN
  Administered 2013-04-25: 50 mg via INTRAVENOUS

## 2013-04-25 MED ORDER — MIDAZOLAM HCL 2 MG/ML PO SYRP: 12.0000 mg | ORAL_SOLUTION | Freq: Once | ORAL | Status: DC | PRN

## 2013-04-25 MED ORDER — FENTANYL CITRATE 0.05 MG/ML IJ SOLN
INTRAMUSCULAR | Status: DC | PRN
  Administered 2013-04-25 (×2): 50 ug via INTRAVENOUS

## 2013-04-25 MED ORDER — BUPIVACAINE HCL (PF) 0.25 % IJ SOLN
INTRAMUSCULAR | Status: DC | PRN
  Administered 2013-04-25: 10 mL

## 2013-04-25 SURGICAL SUPPLY — 34 items
BANDAGE ELASTIC 3 VELCRO ST LF (GAUZE/BANDAGES/DRESSINGS) ×2 IMPLANT
BANDAGE GAUZE ELAST BULKY 4 IN (GAUZE/BANDAGES/DRESSINGS) ×2 IMPLANT
BLADE MINI RND TIP GREEN BEAV (BLADE) IMPLANT
BLADE SURG 15 STRL LF DISP TIS (BLADE) ×2 IMPLANT
BLADE SURG 15 STRL SS (BLADE) ×2
BNDG ESMARK 4X9 LF (GAUZE/BANDAGES/DRESSINGS) IMPLANT
CHLORAPREP W/TINT 26ML (MISCELLANEOUS) ×2 IMPLANT
CLOTH BEACON ORANGE TIMEOUT ST (SAFETY) ×2 IMPLANT
CORDS BIPOLAR (ELECTRODE) ×2 IMPLANT
COVER MAYO STAND STRL (DRAPES) ×2 IMPLANT
COVER TABLE BACK 60X90 (DRAPES) ×2 IMPLANT
CUFF TOURNIQUET SINGLE 18IN (TOURNIQUET CUFF) ×2 IMPLANT
DRAPE EXTREMITY T 121X128X90 (DRAPE) ×2 IMPLANT
DRAPE SURG 17X23 STRL (DRAPES) ×2 IMPLANT
DRSG PAD ABDOMINAL 8X10 ST (GAUZE/BANDAGES/DRESSINGS) ×2 IMPLANT
GAUZE XEROFORM 1X8 LF (GAUZE/BANDAGES/DRESSINGS) ×2 IMPLANT
GLOVE BIO SURGEON STRL SZ7.5 (GLOVE) ×2 IMPLANT
GLOVE BIOGEL PI IND STRL 8 (GLOVE) ×2 IMPLANT
GLOVE BIOGEL PI INDICATOR 8 (GLOVE) ×2
GLOVE SURG SS PI 8.0 STRL IVOR (GLOVE) ×2 IMPLANT
GOWN BRE IMP PREV XXLGXLNG (GOWN DISPOSABLE) ×2 IMPLANT
GOWN PREVENTION PLUS XLARGE (GOWN DISPOSABLE) ×2 IMPLANT
NEEDLE HYPO 25X1 1.5 SAFETY (NEEDLE) IMPLANT
NS IRRIG 1000ML POUR BTL (IV SOLUTION) ×2 IMPLANT
PACK BASIN DAY SURGERY FS (CUSTOM PROCEDURE TRAY) ×2 IMPLANT
PADDING CAST ABS 4INX4YD NS (CAST SUPPLIES) ×1
PADDING CAST ABS COTTON 4X4 ST (CAST SUPPLIES) ×1 IMPLANT
SPONGE GAUZE 4X4 12PLY (GAUZE/BANDAGES/DRESSINGS) ×2 IMPLANT
STOCKINETTE 4X48 STRL (DRAPES) ×2 IMPLANT
SUT ETHILON 4 0 PS 2 18 (SUTURE) ×2 IMPLANT
SYR BULB 3OZ (MISCELLANEOUS) ×2 IMPLANT
SYR CONTROL 10ML LL (SYRINGE) IMPLANT
TOWEL OR 17X24 6PK STRL BLUE (TOWEL DISPOSABLE) ×4 IMPLANT
UNDERPAD 30X30 INCONTINENT (UNDERPADS AND DIAPERS) ×2 IMPLANT

## 2013-04-25 NOTE — H&P (Signed)
Kelsey Yoder is an 67 y.o. female.   Chief Complaint: carpal tunnel syndrome HPI: 67 yo rhd female with over 10 years of issues with right hand/wrist.  Diagnosed with mild carpal tunnel syndrome which has worsened over last three months.  Pins and needles sensation in index and long finger.  Nocturnal symptoms 3x/week.  Positive nerve conduction studies.  She wishes to have a right carpal tunnel release for management of her symptoms.  Past Medical History  Diagnosis Date  . Aortic valve sclerosis     last echo 04/2009  . Osteopenia   . Sinus drainage   . 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 PONV (postoperative nausea and vomiting)     post-op nausea    Past Surgical History  Procedure Laterality Date  . Tonsillectomy  1976  . Total abdominal hysterectomy w/ bilateral salpingoophorectomy  2008  . Colonoscopy  2008  . Cataract extraction Bilateral 2013  . Dental surgery      dental implants  . Breast biopsy Right 2010    benign     Family History  Problem Relation Age of Onset  . Diabetes Mother     double amputee  . Diabetes Father   . Stroke Sister   . Lupus Sister     X 2  . Diabetes Brother   . Cancer Brother     duodenal, prostate   . Stroke Maternal Grandmother   . Stroke Maternal Grandfather   . Congestive Heart Failure Mother    Social History:  reports that she has never smoked. She has never used smokeless tobacco. She reports that she does not drink alcohol or use illicit drugs.  Allergies: No Known Allergies  Medications Prior to Admission  Medication Sig Dispense Refill  . Calcium Carb-Cholecalciferol 600-800 MG-UNIT TABS Take 1 tablet by mouth daily.      . Cholecalciferol (VITAMIN D3) 5000 UNITS CAPS Take 1 capsule by mouth daily.      . Cyanocobalamin (VITAMIN B 12 PO) Take 1,000 mcg by mouth daily.      . ESTERIFIED ESTROGENS PO Take by mouth. OTC      . fish oil-omega-3 fatty acids 1000 MG capsule Take 1 g by mouth daily.       . Multiple Vitamin (MULTIVITAMIN) tablet Take 1 tablet by mouth daily.      . risedronate (ACTONEL) 150 MG tablet Take 1 tablet (150 mg total) by mouth every 30 (thirty) days. with water on empty stomach, nothing by mouth or lie down for next 30 minutes.  3 tablet  4    No results found for this or any previous visit (from the past 48 hour(s)).  No results found.   A comprehensive review of systems was negative except for: Eyes: positive for cataracts  Blood pressure 137/80, pulse 80, temperature 98.1 F (36.7 C), temperature source Oral, resp. rate 18, height 5' (1.524 m), weight 138 lb (62.596 kg), SpO2 99.00%.  General appearance: alert, cooperative and appears stated age Head: Normocephalic, without obvious abnormality, atraumatic Neck: supple, symmetrical, trachea midline Resp: clear to auscultation bilaterally Cardio: regular rate and rhythm GI: non tender Extremities: intact sensation and capillary refill in fingertips though some fingers feel different.  +epl/fpl/io.  atrophy in thenar eminence.  no wounds. Pulses: 2+ and symmetric Skin: Skin color, texture, turgor normal. No rashes or lesions Neurologic: Grossly normal Incision/Wound: na  Assessment/Plan Right carpal tunnel syndrome.  Non operative and operative treatment options were discussed with  the patient and patient wishes to proceed with operative treatment. Risks, benefits, and alternatives of surgery were discussed and the patient agrees with the plan of care.   Gatha Mcnulty R 04/25/2013, 8:25 AM

## 2013-04-25 NOTE — Transfer of Care (Signed)
Immediate Anesthesia Transfer of Care Note  Patient: Kelsey Yoder  Procedure(s) Performed: Procedure(s): CARPAL TUNNEL RELEASE (Right)  Patient Location: PACU  Anesthesia Type:General  Level of Consciousness: awake, alert  and oriented  Airway & Oxygen Therapy: Patient Spontanous Breathing and Patient connected to face mask oxygen  Post-op Assessment: Report given to PACU RN and Post -op Vital signs reviewed and stable  Post vital signs: Reviewed and stable  Complications: No apparent anesthesia complications

## 2013-04-25 NOTE — Anesthesia Postprocedure Evaluation (Signed)
  Anesthesia Post-op Note  Patient: Kelsey Yoder  Procedure(s) Performed: Procedure(s): CARPAL TUNNEL RELEASE (Right)  Patient Location: PACU  Anesthesia Type:General  Level of Consciousness: awake and alert   Airway and Oxygen Therapy: Patient Spontanous Breathing  Post-op Pain: mild  Post-op Assessment: Post-op Vital signs reviewed, Patient's Cardiovascular Status Stable, Respiratory Function Stable, Patent Airway, No signs of Nausea or vomiting and Pain level controlled  Post-op Vital Signs: Reviewed and stable  Complications: No apparent anesthesia complications

## 2013-04-25 NOTE — Anesthesia Preprocedure Evaluation (Signed)
Anesthesia Evaluation  Patient identified by MRN, date of birth, ID band Patient awake    Reviewed: Allergy & Precautions, H&P , NPO status , Patient's Chart, lab work & pertinent test results  History of Anesthesia Complications (+) PONV  Airway Mallampati: I TM Distance: >3 FB Neck ROM: Full    Dental   Pulmonary  breath sounds clear to auscultation        Cardiovascular Rhythm:Regular Rate:Normal  Aortic sclerosis   Neuro/Psych    GI/Hepatic   Endo/Other    Renal/GU      Musculoskeletal   Abdominal   Peds  Hematology   Anesthesia Other Findings   Reproductive/Obstetrics                           Anesthesia Physical Anesthesia Plan  ASA: II  Anesthesia Plan: General   Post-op Pain Management:    Induction: Intravenous  Airway Management Planned: LMA  Additional Equipment:   Intra-op Plan:   Post-operative Plan: Extubation in OR  Informed Consent: I have reviewed the patients History and Physical, chart, labs and discussed the procedure including the risks, benefits and alternatives for the proposed anesthesia with the patient or authorized representative who has indicated his/her understanding and acceptance.     Plan Discussed with: CRNA and Surgeon  Anesthesia Plan Comments:         Anesthesia Quick Evaluation

## 2013-04-25 NOTE — Brief Op Note (Signed)
04/25/2013  9:02 AM  PATIENT:  Kelsey Yoder  67 y.o. female  PRE-OPERATIVE DIAGNOSIS:  RIGHT CARPAL TUNNEL SYNDROME  POST-OPERATIVE DIAGNOSIS:  right carpal tunnel syndrome  PROCEDURE:  Procedure(s): CARPAL TUNNEL RELEASE  SURGEON:  Surgeon(s): Tami Ribas, MD  PHYSICIAN ASSISTANT:   ASSISTANTS: none   ANESTHESIA:   general  EBL:     DRAINS: none   LOCAL MEDICATIONS USED:  MARCAINE     SPECIMEN:  No Specimen  DISPOSITION OF SPECIMEN:  N/A  COUNTS:  YES  TOURNIQUET:   Total Tourniquet Time Documented: Upper Arm (Right) - 16 minutes Total: Upper Arm (Right) - 16 minutes   DICTATION: .Other Dictation: Dictation Number [id number]  PLAN OF CARE: Discharge to home after PACU

## 2013-04-25 NOTE — Anesthesia Procedure Notes (Addendum)
Anesthesia Regional Block:   Narrative:    Procedure Name: LMA Insertion Date/Time: 04/25/2013 8:35 AM Performed by: Zenia Resides D Pre-anesthesia Checklist: Patient identified, Emergency Drugs available, Suction available and Patient being monitored Patient Re-evaluated:Patient Re-evaluated prior to inductionOxygen Delivery Method: Circle System Utilized Preoxygenation: Pre-oxygenation with 100% oxygen Intubation Type: IV induction Ventilation: Mask ventilation without difficulty LMA: LMA inserted LMA Size: 4.0 Number of attempts: 1 Airway Equipment and Method: bite block Placement Confirmation: positive ETCO2 Tube secured with: Tape Dental Injury: Teeth and Oropharynx as per pre-operative assessment

## 2013-04-25 NOTE — Op Note (Signed)
[id number] 

## 2013-04-26 ENCOUNTER — Encounter (HOSPITAL_BASED_OUTPATIENT_CLINIC_OR_DEPARTMENT_OTHER): Payer: Self-pay | Admitting: Orthopedic Surgery

## 2013-04-26 LAB — POCT HEMOGLOBIN-HEMACUE: Hemoglobin: 13.2 g/dL (ref 12.0–15.0)

## 2013-04-26 NOTE — Op Note (Signed)
NAME:  Kelsey Yoder, IGO NO.:  [id number]  MEDICAL RECORD NO.:  [id number]  LOCATION:                                 FACILITY:  PHYSICIAN:  Betha Loa, MD             DATE OF BIRTH:  DATE OF PROCEDURE:  04/25/2013 DATE OF DISCHARGE:                              OPERATIVE REPORT   PREOPERATIVE DIAGNOSIS:  Right carpal tunnel syndrome.  POSTOPERATIVE DIAGNOSIS:  Right carpal tunnel syndrome.  PROCEDURE:  Right carpal tunnel release.  SURGEON:  Betha Loa, MD  ASSISTANT:  None.  ANESTHESIA:  General.  IV FLUIDS:  Per anesthesia flow sheet.  ESTIMATED BLOOD LOSS:  Minimal.  COMPLICATIONS:  None.  SPECIMENS:  None.  TOURNIQUET TIME:  16 minutes.  DISPOSITION:  Stable to PACU.  INDICATIONS:  [name] is a 67 year old right-hand-dominant female, who has had approximately 10 years of issues with her right hand.  She was diagnosed with carpal tunnel syndrome in the past.  She states it has gotten worse over the past 3 months.  She has nocturnal symptoms that wake her and cause her to shake her hand to get relief.  She has positive nerve conduction studies.  She wished to have carpal tunnel release for management of symptoms.  Risks, benefits, and alternatives of surgery were discussed including the risk of blood loss, infection, damage to nerves, vessels, tendons, ligaments, bone; failure of surgery; need for additional surgery, complications with wound healing, continued pain, continued carpal tunnel syndrome.  She voiced understanding of these risks and elected to proceed.  OPERATIVE COURSE:  After being identified preoperatively by myself, the patient and I agreed upon procedure and site of procedure.  Surgical site was marked.  Risks, benefits, and alternatives of surgery were reviewed and she wished to proceed.  Surgical consent had been signed. She was given IV Ancef as preoperative antibiotic prophylaxis.  She was transported to the  operating room and placed on the operating room table in supine position with the right upper extremity on armboard.  General anesthesia was induced by anesthesiologist.  The right upper extremity was prepped and draped in normal sterile orthopedic fashion.  Surgical pause was performed between surgeons, anesthesia, and operating staff, and all were in agreement as to the patient, procedure, and site of procedure.  Tourniquet at the proximal aspect of the extremity was inflated to 250 mmHg after exsanguination of the limb with Esmarch bandage.  A longitudinal incision was made over the transverse carpal ligament.  This was carried into subcutaneous tissues by spreading technique.  Bipolar electrocautery was used to obtain hemostasis.  The palmar fascia was incised sharply.  The transverse carpal ligament was identified.  It was incised in its distal direction.  Care was taken to ensure complete decompression distally.  It was then incised proximally. The scissors were used to split the distal aspect of the volar antebrachial fascia.  A finger was placed into the wound to ensure complete decompression which was the case.  The nerve was inspected.  It was flattened and hyperemic.  The motor branch was identified and was intact.  The wound was copiously irrigated with sterile saline.  It  was then closed with 4-0 nylon in a horizontal mattress fashion.  The wound was injected with 10 mL of 0.25% plain Marcaine to aid in postoperative analgesia.  It was then dressed with sterile Xeroform, 4x4s, and ABD and wrapped with Kerlix and Ace bandage.  Tourniquet was deflated at 16 minutes.  Fingertips were pink with brisk capillary refill after deflation of tourniquet.  The operative drapes were broken down and the patient was awoken from anesthesia safely.  She was transferred back to stretcher and taken to PACU in stable condition.  I will see her back in the office in 1 week for postoperative  followup.  Given Norco 5/325, 1-2 p.o. q.6 hours p.r.n. pain, dispensed #30.     Betha Loa, MD     KK/MEDQ  D:  04/25/2013  T:  04/26/2013  Job:  [id number]

## 2013-04-27 ENCOUNTER — Encounter: Payer: Self-pay | Admitting: Family Medicine

## 2013-05-19 ENCOUNTER — Other Ambulatory Visit: Payer: Self-pay

## 2013-05-23 ENCOUNTER — Encounter: Payer: Self-pay | Admitting: Family Medicine

## 2013-05-29 ENCOUNTER — Other Ambulatory Visit: Payer: Self-pay | Admitting: Family Medicine

## 2013-05-29 DIAGNOSIS — E785 Hyperlipidemia, unspecified: Secondary | ICD-10-CM

## 2013-05-30 ENCOUNTER — Other Ambulatory Visit (INDEPENDENT_AMBULATORY_CARE_PROVIDER_SITE_OTHER): Payer: Federal, State, Local not specified - PPO

## 2013-05-30 DIAGNOSIS — E785 Hyperlipidemia, unspecified: Secondary | ICD-10-CM

## 2013-05-30 LAB — BASIC METABOLIC PANEL
BUN: 12 mg/dL (ref 6–23)
CO2: 27 mEq/L (ref 19–32)
Chloride: 106 mEq/L (ref 96–112)
Creatinine, Ser: 0.7 mg/dL (ref 0.4–1.2)

## 2013-05-30 LAB — LIPID PANEL
Cholesterol: 183 mg/dL (ref 0–200)
LDL Cholesterol: 113 mg/dL — ABNORMAL HIGH (ref 0–99)
Triglycerides: 127 mg/dL (ref 0.0–149.0)
VLDL: 25.4 mg/dL (ref 0.0–40.0)

## 2013-06-01 ENCOUNTER — Encounter: Payer: Self-pay | Admitting: Family Medicine

## 2013-06-01 ENCOUNTER — Ambulatory Visit (INDEPENDENT_AMBULATORY_CARE_PROVIDER_SITE_OTHER): Payer: Federal, State, Local not specified - PPO | Admitting: Family Medicine

## 2013-06-01 VITALS — BP 142/82 | HR 68 | Temp 97.9°F | Ht 59.5 in | Wt 136.0 lb

## 2013-06-01 DIAGNOSIS — Z23 Encounter for immunization: Secondary | ICD-10-CM

## 2013-06-01 DIAGNOSIS — M899 Disorder of bone, unspecified: Secondary | ICD-10-CM

## 2013-06-01 DIAGNOSIS — Z0000 Encounter for general adult medical examination without abnormal findings: Secondary | ICD-10-CM

## 2013-06-01 DIAGNOSIS — E785 Hyperlipidemia, unspecified: Secondary | ICD-10-CM

## 2013-06-01 DIAGNOSIS — G5600 Carpal tunnel syndrome, unspecified upper limb: Secondary | ICD-10-CM

## 2013-06-01 DIAGNOSIS — M8580 Other specified disorders of bone density and structure, unspecified site: Secondary | ICD-10-CM

## 2013-06-01 DIAGNOSIS — G5601 Carpal tunnel syndrome, right upper limb: Secondary | ICD-10-CM

## 2013-06-01 NOTE — Assessment & Plan Note (Signed)
Stable off meds. ?

## 2013-06-01 NOTE — Assessment & Plan Note (Signed)
Preventative protocols reviewed and updated unless pt declined. Discussed healthy diet and lifestyle.  

## 2013-06-01 NOTE — Progress Notes (Signed)
Pre-visit discussion using our clinic review tool. No additional management support is needed unless otherwise documented below in the visit note.  

## 2013-06-01 NOTE — Assessment & Plan Note (Signed)
S/p R CTS release by Dr. Merlyn Lot - doing well.

## 2013-06-01 NOTE — Patient Instructions (Addendum)
Flu shot today. Pneumonia shot today Finish actonel prescription then stop medicine. Next bone density scan will be 2016. Good to see you today, call us with questions. Return as needed or in 1 year for next physical

## 2013-06-01 NOTE — Addendum Note (Signed)
Addended by: Josph Macho A on: 06/01/2013 12:14 PM   Modules accepted: Orders

## 2013-06-01 NOTE — Assessment & Plan Note (Signed)
Has taken 5+ years of bisphosphonate for osteopenia. On OTC estrogen (estroven) Compliant with calcium and vit D daily. Recommended stop actonel after she finishes her current prescription.

## 2013-06-01 NOTE — Progress Notes (Signed)
Subjective:    Patient ID: Kelsey Yoder, female    DOB: Dec 08, 1945, 67 y.o.   MRN: [id number]  HPI CC: CPE  Only has medicare part A.  Osteopenia - taking calcium and vitamin D. Also on actonel for last 5 years. DEXA scan done 09/2012. Showing persistent osteopenia of hips.  Previously on estrogen, stopped when unable to get prior formulation of estrogen.   Preventative:  Colonoscopy - unsure when done, told didn't need for 15 yrs. thinks around 2008-2010.  No BM changes, no blood in stool. Well woman with OBGYN 09/2012 (Dr. Macon Large).  S/p hysterectomy 2008 for h/o abnormal paps. Last mamogram 09/2012 normal.  DEXA - 2014 - osteopenia, rec rpt in 2 years Flu shot - today. Tetanus shot - done 2010.  Pneumovax today. zostavax done 2012  Caffeine: occasional Lives alone, no pets Occupation: retired, was in Research scientist (life sciences) Edu: master's Activity: goes to gym 3-4x/wk Diet: some water, fruits and vegetables daily   Denies depression/anhedonia, sadness. Seat belt use discussed   Medications and allergies reviewed and updated in chart.  Past histories reviewed and updated if relevant as below. Patient Active Problem List   Diagnosis Date Noted  . Achilles tendonitis 03/04/2013  . CTS (carpal tunnel syndrome) 03/04/2013  . Healthcare maintenance 05/27/2012  . Aortic valve sclerosis   . Vitamin D deficiency   . Mild hyperlipidemia   . Osteopenia    Past Medical History  Diagnosis Date  . Aortic valve sclerosis     last echo 04/2009  . Osteopenia   . Sinus drainage   . 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 PONV (postoperative nausea and vomiting)     post-op nausea   Past Surgical History  Procedure Laterality Date  . Tonsillectomy  1976  . Total abdominal hysterectomy w/ bilateral salpingoophorectomy  2008  . Colonoscopy  2008  . Cataract extraction Bilateral 2013  . Dental surgery      dental implants  . Breast biopsy Right 2010    benign   .  Carpal tunnel release Right 04/25/2013    Procedure: CARPAL TUNNEL RELEASE;  Surgeon: Tami Ribas, MD;  Location: [facility]   History  Substance Use Topics  . Smoking status: Never Smoker   . Smokeless tobacco: Never Used  . Alcohol Use: No   Family History  Problem Relation Age of Onset  . Diabetes Mother     double amputee  . Diabetes Father   . Stroke Sister   . Lupus Sister     X 2  . Diabetes Brother   . Cancer Brother     duodenal, prostate   . Stroke Maternal Grandmother   . Stroke Maternal Grandfather   . Congestive Heart Failure Mother    No Known Allergies Current Outpatient Prescriptions on File Prior to Visit  Medication Sig Dispense Refill  . Calcium Carb-Cholecalciferol 600-800 MG-UNIT TABS Take 1 tablet by mouth daily.      . Cholecalciferol (VITAMIN D3) 5000 UNITS CAPS Take 1 capsule by mouth daily.      . Cyanocobalamin (VITAMIN B 12 PO) Take 1,000 mcg by mouth daily.      . ESTERIFIED ESTROGENS PO Take by mouth. OTC      . fish oil-omega-3 fatty acids 1000 MG capsule Take 1 g by mouth daily.      . Multiple Vitamin (MULTIVITAMIN) tablet Take 1 tablet by mouth daily.      . risedronate (ACTONEL) 150  MG tablet Take 1 tablet (150 mg total) by mouth every 30 (thirty) days. with water on empty stomach, nothing by mouth or lie down for next 30 minutes.  3 tablet  4   No current facility-administered medications on file prior to visit.     Review of Systems  Constitutional: Negative for fever, chills, activity change, appetite change, fatigue and unexpected weight change.  HENT: Negative for hearing loss.   Eyes: Negative for visual disturbance.  Respiratory: Negative for cough, chest tightness, shortness of breath and wheezing.   Cardiovascular: Negative for chest pain, palpitations and leg swelling.  Gastrointestinal: Negative for nausea, vomiting, abdominal pain, diarrhea, constipation, blood in stool and abdominal distention.   Genitourinary: Negative for hematuria and difficulty urinating.  Musculoskeletal: Negative for arthralgias, myalgias and neck pain.  Skin: Negative for rash.  Neurological: Negative for dizziness, seizures, syncope and headaches.  Hematological: Negative for adenopathy. Does not bruise/bleed easily.  Psychiatric/Behavioral: Negative for dysphoric mood. The patient is not nervous/anxious.        Objective:   Physical Exam  Nursing note and vitals reviewed. Constitutional: She is oriented to person, place, and time. She appears well-developed and well-nourished. No distress.  HENT:  Head: Normocephalic and atraumatic.  Right Ear: Hearing, tympanic membrane, external ear and ear canal normal.  Left Ear: Hearing, tympanic membrane, external ear and ear canal normal.  Nose: Nose normal.  Mouth/Throat: Oropharynx is clear and moist. No oropharyngeal exudate.  Eyes: Conjunctivae and EOM are normal. Pupils are equal, round, and reactive to light. No scleral icterus.  Neck: Normal range of motion. Neck supple. No thyromegaly present.  Cardiovascular: Normal rate, regular rhythm, normal heart sounds and intact distal pulses.   No murmur heard. Pulses:      Radial pulses are 2+ on the right side, and 2+ on the left side.  Pulmonary/Chest: Effort normal and breath sounds normal. No respiratory distress. She has no wheezes. She has no rales.  Abdominal: Soft. Bowel sounds are normal. She exhibits no distension and no mass. There is no tenderness. There is no rebound and no guarding.  Musculoskeletal: Normal range of motion. She exhibits no edema.  Lymphadenopathy:    She has no cervical adenopathy.  Neurological: She is alert and oriented to person, place, and time.  CN grossly intact, station and gait intact  Skin: Skin is warm and dry. No rash noted.  Psychiatric: She has a normal mood and affect. Her behavior is normal. Judgment and thought content normal.       Assessment & Plan:

## 2013-09-14 ENCOUNTER — Encounter: Payer: Self-pay | Admitting: Obstetrics & Gynecology

## 2013-09-14 ENCOUNTER — Ambulatory Visit (INDEPENDENT_AMBULATORY_CARE_PROVIDER_SITE_OTHER): Payer: Federal, State, Local not specified - PPO | Admitting: Obstetrics & Gynecology

## 2013-09-14 VITALS — BP 115/78 | HR 70 | Ht 59.5 in | Wt 137.0 lb

## 2013-09-14 DIAGNOSIS — Z1231 Encounter for screening mammogram for malignant neoplasm of breast: Secondary | ICD-10-CM

## 2013-09-14 DIAGNOSIS — Z01419 Encounter for gynecological examination (general) (routine) without abnormal findings: Secondary | ICD-10-CM

## 2013-09-14 DIAGNOSIS — N951 Menopausal and female climacteric states: Secondary | ICD-10-CM

## 2013-09-14 NOTE — Progress Notes (Signed)
    GYNECOLOGY CLINIC ANNUAL PREVENTATIVE CARE ENCOUNTER NOTE  Subjective:     Kelsey Yoder is a 68 y.o. PMP female s/p TAH/BSO in 2008 here for a routine annual gynecologic exam.  Current complaints: rare hot flashes and night sweats, taken OTC Estroven.     Gynecologic History No LMP recorded. Patient has had a hysterectomy. Normal vaginal cuff pap smears from 2008-2013; no further paps needed Last mammogram: 09/13/12. Results were: normal Last DEXA scan 09/13/12 Osteopenia of her hip. Recommended adequate calcium (1200 mg/day) and Vitamin D (800 International Units/day) intake, as well as weight bearing exercises.  Next bone mineral density scan is recommended in 2016.  The following portions of the patient's history were reviewed and updated as appropriate: allergies, current medications, past family history, past medical history, past social history, past surgical history and problem list.  Review of Systems Pertinent items are noted in HPI.    Objective:   BP 115/78  Pulse 70  Ht 4' 11.5" (1.511 m)  Wt 137 lb (62.143 kg)  BMI 27.22 kg/m2 GENERAL: Well-developed, well-nourished female in no acute distress.  HEENT: Normocephalic, atraumatic. Sclerae anicteric.  NECK: Supple. Normal thyroid.  LUNGS: Clear to auscultation bilaterally.  HEART: Regular rate and rhythm.  BREASTS: Symmetric in size. No masses, skin changes, nipple drainage, or lymphadenopathy.  ABDOMEN: Soft, nontender, nondistended. No organomegaly.  PELVIC: Normal external female genitalia. Vagina is with significant atrophy, well-healed vaginal cuff. Normal discharge. No masses palpated on bimanual exam  EXTREMITIES: No cyanosis, clubbing, or edema, 2+ distal pulses.   Assessment:   Normal annual gynecologic exam   Plan:   No need for pap, TAH/BSO not done for cancer (done for abnormal pap smears); normal yearly paps of vaginal cuff since 2008.  Mammogram scheduled Continue Estroven for now Routine  preventative health maintenance measures emphasized    Verita Schneiders, MD, [place] Attending [place], [place]

## 2013-09-14 NOTE — Patient Instructions (Signed)

## 2013-10-07 ENCOUNTER — Ambulatory Visit (HOSPITAL_COMMUNITY)
Admission: RE | Admit: 2013-10-07 | Discharge: 2013-10-07 | Disposition: A | Discharge status: Home / Self Care | Payer: Federal, State, Local not specified - PPO | Source: Ambulatory Visit | Attending: Obstetrics & Gynecology | Admitting: Obstetrics & Gynecology

## 2013-10-07 DIAGNOSIS — Z01419 Encounter for gynecological examination (general) (routine) without abnormal findings: Secondary | ICD-10-CM

## 2013-10-07 DIAGNOSIS — Z1231 Encounter for screening mammogram for malignant neoplasm of breast: Secondary | ICD-10-CM | POA: Insufficient documentation

## 2013-10-11 ENCOUNTER — Encounter: Payer: Self-pay | Admitting: Obstetrics & Gynecology

## 2013-10-11 ENCOUNTER — Telehealth: Payer: Self-pay | Admitting: *Deleted

## 2013-10-11 DIAGNOSIS — N6320 Unspecified lump in the left breast, unspecified quadrant: Secondary | ICD-10-CM | POA: Insufficient documentation

## 2013-10-11 NOTE — Telephone Encounter (Signed)
Pt. Called. Informed her of results. Diagnostic mammogram and ultrasound ordered. Called breast center at [phone], appointment scheduled this Friday at 0830 ([address]). Called pt. Back and informed her of appointment date, time and location. Also gave pt. Phone number to breast center in case she should need it for any reason. Pt. Verbalized understanding and gratitude and had no questions.

## 2013-10-11 NOTE — Telephone Encounter (Signed)
Attempted to call patient, no answer, left message for patient to call clinic.

## 2013-10-11 NOTE — Telephone Encounter (Signed)
Message copied by Sue Lush on Tue Oct 11, 2013 11:40 AM ------      Message from: Verita Schneiders A      Created: Tue Oct 11, 2013 10:23 AM       Please contact patient about abnormal mammogram and ensure she has follow up/[facility] referral. ------

## 2013-10-14 ENCOUNTER — Ambulatory Visit
Admission: RE | Admit: 2013-10-14 | Discharge: 2013-10-14 | Disposition: A | Discharge status: Home / Self Care | Payer: Federal, State, Local not specified - PPO | Source: Ambulatory Visit | Attending: Obstetrics & Gynecology | Admitting: Obstetrics & Gynecology

## 2013-10-14 DIAGNOSIS — N6320 Unspecified lump in the left breast, unspecified quadrant: Secondary | ICD-10-CM

## 2014-03-13 ENCOUNTER — Other Ambulatory Visit: Payer: Self-pay | Admitting: Obstetrics & Gynecology

## 2014-03-13 DIAGNOSIS — N6320 Unspecified lump in the left breast, unspecified quadrant: Secondary | ICD-10-CM

## 2014-04-17 ENCOUNTER — Ambulatory Visit
Admission: RE | Admit: 2014-04-17 | Discharge: 2014-04-17 | Disposition: A | Discharge status: Home / Self Care | Payer: Federal, State, Local not specified - PPO | Source: Ambulatory Visit | Attending: Obstetrics & Gynecology | Admitting: Obstetrics & Gynecology

## 2014-04-17 DIAGNOSIS — N6320 Unspecified lump in the left breast, unspecified quadrant: Secondary | ICD-10-CM

## 2014-05-26 ENCOUNTER — Other Ambulatory Visit: Payer: Self-pay | Admitting: Family Medicine

## 2014-05-26 ENCOUNTER — Other Ambulatory Visit (INDEPENDENT_AMBULATORY_CARE_PROVIDER_SITE_OTHER): Payer: Federal, State, Local not specified - PPO

## 2014-05-26 DIAGNOSIS — E559 Vitamin D deficiency, unspecified: Secondary | ICD-10-CM

## 2014-05-26 DIAGNOSIS — E785 Hyperlipidemia, unspecified: Secondary | ICD-10-CM

## 2014-05-26 LAB — LIPID PANEL
CHOLESTEROL: 146 mg/dL (ref 0–200)
HDL: 33.6 mg/dL — ABNORMAL LOW (ref >39.00)
LDL CALC: 95 mg/dL (ref 0–99)
NonHDL: 112.4
Total CHOL/HDL Ratio: 4
Triglycerides: 89 mg/dL (ref 0.0–149.0)
VLDL: 17.8 mg/dL (ref 0.0–40.0)

## 2014-05-26 LAB — BASIC METABOLIC PANEL
BUN: 13 mg/dL (ref 6–23)
CO2: 28 mEq/L (ref 19–32)
Calcium: 9.3 mg/dL (ref 8.4–10.5)
Chloride: 105 mEq/L (ref 96–112)
Creatinine, Ser: 0.7 mg/dL (ref 0.4–1.2)
GFR: 107.62 mL/min (ref >60.00)
Glucose, Bld: 102 mg/dL — ABNORMAL HIGH (ref 70–99)
POTASSIUM: 4.1 meq/L (ref 3.5–5.1)
SODIUM: 141 meq/L (ref 135–145)

## 2014-05-26 LAB — VITAMIN D 25 HYDROXY (VIT D DEFICIENCY, FRACTURES): VITD: 59.73 ng/mL (ref 30.00–100.00)

## 2014-06-02 ENCOUNTER — Encounter: Payer: Self-pay | Admitting: Family Medicine

## 2014-06-02 ENCOUNTER — Ambulatory Visit (INDEPENDENT_AMBULATORY_CARE_PROVIDER_SITE_OTHER): Payer: Federal, State, Local not specified - PPO | Admitting: Family Medicine

## 2014-06-02 VITALS — BP 124/72 | HR 92 | Temp 98.1°F | Ht 59.0 in | Wt 135.0 lb

## 2014-06-02 DIAGNOSIS — N6320 Unspecified lump in the left breast, unspecified quadrant: Secondary | ICD-10-CM

## 2014-06-02 DIAGNOSIS — Z23 Encounter for immunization: Secondary | ICD-10-CM

## 2014-06-02 DIAGNOSIS — Z86010 Personal history of colonic polyps: Secondary | ICD-10-CM

## 2014-06-02 DIAGNOSIS — Z0000 Encounter for general adult medical examination without abnormal findings: Secondary | ICD-10-CM

## 2014-06-02 DIAGNOSIS — Z7189 Other specified counseling: Secondary | ICD-10-CM | POA: Insufficient documentation

## 2014-06-02 DIAGNOSIS — E559 Vitamin D deficiency, unspecified: Secondary | ICD-10-CM

## 2014-06-02 DIAGNOSIS — Z1211 Encounter for screening for malignant neoplasm of colon: Secondary | ICD-10-CM

## 2014-06-02 NOTE — Progress Notes (Signed)
Pre visit review using our clinic review tool, if applicable. No additional management support is needed unless otherwise documented below in the visit note. 

## 2014-06-02 NOTE — Assessment & Plan Note (Signed)
Good levels with supplementation.

## 2014-06-02 NOTE — Assessment & Plan Note (Signed)
Fibroadenomas currently followed with Q74mo mammo/US

## 2014-06-02 NOTE — Patient Instructions (Addendum)
Flu shot today. prevnar today. Stool kit today Bring me copy of living will at your convenience. Return as needed or in 1 year for next physical. Good to see you today, call us with questions.

## 2014-06-02 NOTE — Progress Notes (Signed)
BP 124/72 mmHg  Pulse 92  Temp(Src) 98.1 F (36.7 C) (Oral)  Ht _0  (1.499 m)  Wt 135 lb (61.236 kg)  BMI 27.25 kg/m2  SpO2 98%   CC: medicare wellness visit  Subjective:    Patient ID: Kelsey Yoder, female    DOB: [birth year], 68 y.o.   MRN: [id number]  HPI: Kelsey Yoder is a 68 y.o. female presenting on 06/02/2014 for Annual Exam   Osteopenia - taking calcium and vitamin D. Also on actonel for last 5 years. DEXA scan done 09/2012. Showing persistent osteopenia of hips. Previously on estrogen, stopped when unable to get prior formulation of estrogen.   Denies depression/anhedonia, sadness.  Preventative: Colonoscopy - today endorses h/o normal colonoscopy then rpt with ?polyps. Unsure when f/u due, unsure when last colonoscopy was. thinks around early 2000s. No BM changes, no blood in stool. Discussed. Will do stool kit today and try to get records from [place] where she had her last colonoscopy [facility] 09/2013 (Dr. Harolyn Rutherford). S/p hysterectomy 2008 for h/o abnormal paps. S/p normal paps 2008-2013, no further recommended. Mammogram abnl 10/2013 s/p dx mammo/US: likely fibroadenoma, rpt 04/2014 same, rec rpt [id number] - 2014 - osteopenia, T -1.5 at hip. Regular with calcium and vit D. Flu shot - today. Tetanus shot - done 2010.  Pneumovax 2014. prevnar today. zostavax done 2012 Advanced directive: has at home, asked to bring copy.  HCPOA is sister DHenreitta Leber Caffeine: occasional Lives alone, no pets Occupation: retired, was in fArmed forces operational officerEdu: mBrewing technologistActivity: goes to gym 3-4x/wk (water aerobics and weight lifting) Diet: some water, fruits and vegetables daily   Relevant past medical, surgical, family and social history reviewed and updated as indicated.  Allergies and medications reviewed and updated. Current Outpatient Prescriptions on File Prior to Visit  Medication Sig  . Calcium Carb-Cholecalciferol 600-800 MG-UNIT  TABS Take 1 tablet by mouth daily.  . Cholecalciferol (VITAMIN D3) 3000 UNITS TABS Take 1 tablet by mouth daily.  . Cyanocobalamin (VITAMIN B 12 PO) Take 1,000 mcg by mouth daily.  . fish oil-omega-3 fatty acids 1000 MG capsule Take 1 g by mouth daily.  . Multiple Vitamin (MULTIVITAMIN) tablet Take 1 tablet by mouth daily.  . Nutritional Supplements (ESTROVEN MOOD & MEMORY) TABS Take 1 tablet by mouth daily.   No current facility-administered medications on file prior to visit.    Review of Systems  Constitutional: Negative for fever, chills, activity change, appetite change, fatigue and unexpected weight change.  HENT: Negative for hearing loss.   Eyes: Negative for visual disturbance.  Respiratory: Negative for cough, chest tightness, shortness of breath and wheezing.   Cardiovascular: Negative for chest pain, palpitations and leg swelling.  Gastrointestinal: Positive for diarrhea (recent GI bug). Negative for nausea, vomiting, abdominal pain, constipation, blood in stool and abdominal distention.  Genitourinary: Negative for hematuria and difficulty urinating.  Musculoskeletal: Negative for myalgias, arthralgias and neck pain.  Skin: Negative for rash.  Neurological: Negative for dizziness, seizures, syncope and headaches.  Hematological: Negative for adenopathy. Does not bruise/bleed easily.  Psychiatric/Behavioral: Negative for dysphoric mood. The patient is not nervous/anxious.    Per HPI unless specifically indicated above    Objective:    BP 124/72 mmHg  Pulse 92  Temp(Src) 98.1 F (36.7 C) (Oral)  Ht _1  (1.499 m)  Wt 135 lb (61.236 kg)  BMI 27.25 kg/m2  SpO2 98%  Physical Exam  Constitutional: She is oriented to person,  place, and time. She appears well-developed and well-nourished. No distress.  HENT:  Head: Normocephalic and atraumatic.  Right Ear: Hearing, tympanic membrane, external ear and ear canal normal.  Left Ear: Hearing, tympanic membrane, external ear  and ear canal normal.  Nose: Nose normal.  Mouth/Throat: Uvula is midline, oropharynx is clear and moist and mucous membranes are normal. No oropharyngeal exudate, posterior oropharyngeal edema or posterior oropharyngeal erythema.  Eyes: Conjunctivae and EOM are normal. Pupils are equal, round, and reactive to light. No scleral icterus.  Neck: Normal range of motion. Neck supple. Carotid bruit is not present. No thyromegaly present.  Cardiovascular: Normal rate, regular rhythm, normal heart sounds and intact distal pulses.   No murmur heard. Pulses:      Radial pulses are 2+ on the right side, and 2+ on the left side.  Pulmonary/Chest: Effort normal and breath sounds normal. No respiratory distress. She has no wheezes. She has no rales.  breast exam deferred, per OBGYN  Abdominal: Soft. Bowel sounds are normal. She exhibits no distension and no mass. There is no tenderness. There is no rebound and no guarding.  Genitourinary:  Deferred - followed by OBGYN  Musculoskeletal: Normal range of motion. She exhibits no edema.  Lymphadenopathy:    She has no cervical adenopathy.  Neurological: She is alert and oriented to person, place, and time.  CN grossly intact, station and gait intact  Skin: Skin is warm and dry. No rash noted.  Psychiatric: She has a normal mood and affect. Her behavior is normal. Judgment and thought content normal.  Nursing note and vitals reviewed.  Results for orders placed or performed in visit on 05/26/14  Lipid panel  Result Value Ref Range   Cholesterol 146 0 - 200 mg/dL   Triglycerides 89.0 0.0 - 149.0 mg/dL   HDL 33.60 (L) >39.00 mg/dL   VLDL 17.8 0.0 - 40.0 mg/dL   LDL Cholesterol 95 0 - 99 mg/dL   Total CHOL/HDL Ratio 4    NonHDL 932.67   Basic metabolic panel  Result Value Ref Range   Sodium 141 135 - 145 mEq/L   Potassium 4.1 3.5 - 5.1 mEq/L   Chloride 105 96 - 112 mEq/L   CO2 28 19 - 32 mEq/L   Glucose, Bld 102 (H) 70 - 99 mg/dL   BUN 13 6 - 23  mg/dL   Creatinine, Ser 0.7 0.4 - 1.2 mg/dL   Calcium 9.3 8.4 - 10.5 mg/dL   GFR 107.62 >60.00 mL/min  Vit D  25 hydroxy (rtn osteoporosis monitoring)  Result Value Ref Range   VITD 59.73 30.00 - 100.00 ng/mL      Assessment & Plan:   Problem List Items Addressed This Visit    Vitamin D deficiency    Good levels with supplementation.    Possible left breast mass    Fibroadenomas currently followed with Q33momammo/US    History of colonic polyps    Unsure of details [facility]    Healthcare maintenance - Primary    Preventative protocols reviewed and updated unless pt declined. Discussed healthy diet and lifestyle.     Advanced care planning/counseling discussion    Advanced directive: has at home, asked to bring copy.  HCPOA is sister DHenreitta Leber    Other Visit Diagnoses    Colon cancer screening        Relevant Orders       Fecal occult blood, imunochemical  Follow up plan: Return in about 1 year (around 06/03/2015), or as needed, for annual exam, prior fasting for blood work.

## 2014-06-02 NOTE — Assessment & Plan Note (Addendum)
Advanced directive: has at home, asked to bring copy.  HCPOA is sister Kelsey Yoder

## 2014-06-02 NOTE — Addendum Note (Signed)
Addended by: Royann Shivers A on: 06/02/2014 12:27 PM   Modules accepted: Orders

## 2014-06-02 NOTE — Assessment & Plan Note (Signed)
Unsure of details - will request latest colonoscopy from [facility]. ifob today.

## 2014-06-02 NOTE — Assessment & Plan Note (Signed)
Preventative protocols reviewed and updated unless pt declined. Discussed healthy diet and lifestyle.  

## 2014-06-19 ENCOUNTER — Other Ambulatory Visit (INDEPENDENT_AMBULATORY_CARE_PROVIDER_SITE_OTHER): Payer: Federal, State, Local not specified - PPO

## 2014-06-19 ENCOUNTER — Other Ambulatory Visit: Payer: Federal, State, Local not specified - PPO

## 2014-06-19 DIAGNOSIS — Z1211 Encounter for screening for malignant neoplasm of colon: Secondary | ICD-10-CM

## 2014-06-19 LAB — FECAL OCCULT BLOOD, GUAIAC: FECAL OCCULT BLD: NEGATIVE

## 2014-06-19 LAB — FECAL OCCULT BLOOD, IMMUNOCHEMICAL: Fecal Occult Bld: NEGATIVE

## 2014-06-20 ENCOUNTER — Encounter: Payer: Self-pay | Admitting: *Deleted

## 2014-06-22 ENCOUNTER — Telehealth: Payer: Self-pay | Admitting: Family Medicine

## 2014-06-22 NOTE — Telephone Encounter (Signed)
LVM for pt to call back: I need to know where she had her last colonoscopy done in [place] [employer] [place] at? Facility name: Phone #: Fax #:

## 2014-06-27 NOTE — Telephone Encounter (Signed)
Dr. Darnell Level, I have spoke with the pt and she is unable to remember who did her colonoscopy in [place], [place]. She thought his name was Katherine Roan, MD but I have searched him and am unable to find and GI doc's named Katherine Roan. Pt states she will continue to look for previous doctors name.

## 2014-06-29 NOTE — Telephone Encounter (Signed)
FYIGinger Organ, MD--office stated no record of pt [address] [place] [address] [place]  Harold Barban records from Dr. Stanford Breed) [address] [place], [place] Ph [phone] Eyvonne Mechanic)  Dr. Willodean Rosenthal office faxed Dr. Jacalyn Lefevre records from 2005. This was the only copy either office had of pt's records. Some hard to read (small font). In your in-box for review then to scan.

## 2014-07-01 ENCOUNTER — Encounter: Payer: Self-pay | Admitting: Family Medicine

## 2014-07-01 NOTE — Telephone Encounter (Addendum)
Reviewed, asked to scan. Colonoscopy 2005 rpt due 7-10 yrs. plz notify pt we reviewed last colonsocopy done 06/2004. Rpt due now, but since iFOB normal we can continue with iFOBs and discuss at next physical in 1 year.

## 2014-07-03 NOTE — Telephone Encounter (Signed)
Patient notified and verbalized understanding. 

## 2014-09-25 ENCOUNTER — Encounter: Payer: Self-pay | Admitting: Obstetrics & Gynecology

## 2014-09-25 ENCOUNTER — Ambulatory Visit (INDEPENDENT_AMBULATORY_CARE_PROVIDER_SITE_OTHER): Payer: Federal, State, Local not specified - PPO | Admitting: Obstetrics & Gynecology

## 2014-09-25 VITALS — BP 138/86 | HR 80 | Ht 59.0 in | Wt 136.0 lb

## 2014-09-25 DIAGNOSIS — M8580 Other specified disorders of bone density and structure, unspecified site: Secondary | ICD-10-CM

## 2014-09-25 DIAGNOSIS — D242 Benign neoplasm of left breast: Secondary | ICD-10-CM | POA: Diagnosis not present

## 2014-09-25 DIAGNOSIS — Z01419 Encounter for gynecological examination (general) (routine) without abnormal findings: Secondary | ICD-10-CM

## 2014-09-25 NOTE — Progress Notes (Signed)
    GYNECOLOGY CLINIC ANNUAL PREVENTATIVE CARE ENCOUNTER NOTE  Subjective:  Kelsey Yoder is a 69 y.o. PMP female s/p TAH/BSO in 2008 here for a routine annual gynecologic exam. Current complaints: none.   Gynecologic History No LMP recorded. Patient has had a hysterectomy. Normal vaginal cuff pap smears from 2008-2013; no further paps needed  Healthcare Maintenance Last mammogram: 10/07/13 Results were: possible left breast mass.  Follow up ultrasound showed probable fibroadenomas in left breast; bilateral diagnostic mammogram and left breast ultrasound in 6 months was recommended. Last DEXA scan 09/13/12 Osteopenia of her hip. Recommended adequate calcium (1200 mg/day) and Vitamin D (800 International Units/day) intake, as well as weight bearing exercises. Next bone mineral density scan is recommended in 2016. Colon cancer screening: Done 06/02/14 with her PCP  The following portions of the patient's history were reviewed and updated as appropriate: allergies, current medications, past family history, past medical history, past social history, past surgical history and problem list.  Review of Systems Pertinent items are noted in HPI.   Objective:  Blood pressure 138/86, pulse 80, height 4\' 11"  (1.499 m), weight 136 lb (61.689 kg). GENERAL: Well-developed, well-nourished female in no acute distress.  HEENT: Normocephalic, atraumatic. Sclerae anicteric.  NECK: Supple. Normal thyroid.  LUNGS: Clear to auscultation bilaterally.  HEART: Regular rate and rhythm.  BREASTS: Symmetric in size. Small fibroadenoma palpated in left breast, no other masses, skin changes, nipple drainage, or lymphadenopathy bilaterally.  ABDOMEN: Soft, nontender, nondistended. No organomegaly.  PELVIC: Normal external female genitalia. Vagina is with significant atrophy, well-healed vaginal cuff. Normal discharge. No masses palpated on bimanual exam  EXTREMITIES: No cyanosis, clubbing, or  edema, 2+ distal pulses.  Assessment:   Normal annual gynecologic exam s/p TAH/BSO not done for cancer (done for abnormal pap smears); normal yearly paps of vaginal cuff since 2008. No pap needed at this visit. Left breast fibroadenomas  Plan:  Diagnostic mammogram and and left breast ultrasound ordered as recommended BMD scan also ordered; will follow up results and manage accordingly. Routine preventative health maintenance measures emphasized    Verita Schneiders, MD, [place] Attending [place], [place]

## 2014-09-25 NOTE — Patient Instructions (Signed)
Preventive Care for Adults A healthy lifestyle and preventive care can promote health and wellness. Preventive health guidelines for women include the following key practices.  A routine yearly physical is a good way to check with your health care provider about your health and preventive screening. It is a chance to share any concerns and updates on your health and to receive a thorough exam.  Visit your dentist for a routine exam and preventive care every 6 months. Brush your teeth twice a day and floss once a day. Good oral hygiene prevents tooth decay and gum disease.  The frequency of eye exams is based on your age, health, family medical history, use of contact lenses, and other factors. Follow your health care provider's recommendations for frequency of eye exams.  Eat a healthy diet. Foods like vegetables, fruits, whole grains, low-fat dairy products, and lean protein foods contain the nutrients you need without too many calories. Decrease your intake of foods high in solid fats, added sugars, and salt. Eat the right amount of calories for you.Get information about a proper diet from your health care provider, if necessary.  Regular physical exercise is one of the most important things you can do for your health. Most adults should get at least 150 minutes of moderate-intensity exercise (any activity that increases your heart rate and causes you to sweat) each week. In addition, most adults need muscle-strengthening exercises on 2 or more days a week.  Maintain a healthy weight. The body mass index (BMI) is a screening tool to identify possible weight problems. It provides an estimate of body fat based on height and weight. Your health care provider can find your BMI and can help you achieve or maintain a healthy weight.For adults 20 years and older:  A BMI below 18.5 is considered underweight.  A BMI of 18.5 to 24.9 is normal.  A BMI of 25 to 29.9 is considered overweight.  A BMI of  30 and above is considered obese.  Maintain normal blood lipids and cholesterol levels by exercising and minimizing your intake of saturated fat. Eat a balanced diet with plenty of fruit and vegetables. Blood tests for lipids and cholesterol should begin at age 76 and be repeated every 5 years. If your lipid or cholesterol levels are high, you are over 50, or you are at high risk for heart disease, you may need your cholesterol levels checked more frequently.Ongoing high lipid and cholesterol levels should be treated with medicines if diet and exercise are not working.  If you smoke, find out from your health care provider how to quit. If you do not use tobacco, do not start.  Lung cancer screening is recommended for adults aged 22-80 years who are at high risk for developing lung cancer because of a history of smoking. A yearly low-dose CT scan of the lungs is recommended for people who have at least a 30-pack-year history of smoking and are a current smoker or have quit within the past 15 years. A pack year of smoking is smoking an average of 1 pack of cigarettes a day for 1 year (for example: 1 pack a day for 30 years or 2 packs a day for 15 years). Yearly screening should continue until the smoker has stopped smoking for at least 15 years. Yearly screening should be stopped for people who develop a health problem that would prevent them from having lung cancer treatment.  If you are pregnant, do not drink alcohol. If you are breastfeeding,  be very cautious about drinking alcohol. If you are not pregnant and choose to drink alcohol, do not have more than 1 drink per day. One drink is considered to be 12 ounces (355 mL) of beer, 5 ounces (148 mL) of wine, or 1.5 ounces (44 mL) of liquor.  Avoid use of street drugs. Do not share needles with anyone. Ask for help if you need support or instructions about stopping the use of drugs.  High blood pressure causes heart disease and increases the risk of  stroke. Your blood pressure should be checked at least every 1 to 2 years. Ongoing high blood pressure should be treated with medicines if weight loss and exercise do not work.  If you are 75-52 years old, ask your health care provider if you should take aspirin to prevent strokes.  Diabetes screening involves taking a blood sample to check your fasting blood sugar level. This should be done once every 3 years, after age 15, if you are within normal weight and without risk factors for diabetes. Testing should be considered at a younger age or be carried out more frequently if you are overweight and have at least 1 risk factor for diabetes.  Breast cancer screening is essential preventive care for women. You should practice "breast self-awareness." This means understanding the normal appearance and feel of your breasts and may include breast self-examination. Any changes detected, no matter how small, should be reported to a health care provider. Women in their 58s and 30s should have a clinical breast exam (CBE) by a health care provider as part of a regular health exam every 1 to 3 years. After age 16, women should have a CBE every year. Starting at age 53, women should consider having a mammogram (breast X-ray test) every year. Women who have a family history of breast cancer should talk to their health care provider about genetic screening. Women at a high risk of breast cancer should talk to their health care providers about having an MRI and a mammogram every year.  Breast cancer gene (BRCA)-related cancer risk assessment is recommended for women who have family members with BRCA-related cancers. BRCA-related cancers include breast, ovarian, tubal, and peritoneal cancers. Having family members with these cancers may be associated with an increased risk for harmful changes (mutations) in the breast cancer genes BRCA1 and BRCA2. Results of the assessment will determine the need for genetic counseling and  BRCA1 and BRCA2 testing.  Routine pelvic exams to screen for cancer are no longer recommended for nonpregnant women who are considered low risk for cancer of the pelvic organs (ovaries, uterus, and vagina) and who do not have symptoms. Ask your health care provider if a screening pelvic exam is right for you.  If you have had past treatment for cervical cancer or a condition that could lead to cancer, you need Pap tests and screening for cancer for at least 20 years after your treatment. If Pap tests have been discontinued, your risk factors (such as having a new sexual partner) need to be reassessed to determine if screening should be resumed. Some women have medical problems that increase the chance of getting cervical cancer. In these cases, your health care provider may recommend more frequent screening and Pap tests.  The HPV test is an additional test that may be used for cervical cancer screening. The HPV test looks for the virus that can cause the cell changes on the cervix. The cells collected during the Pap test can be  tested for HPV. The HPV test could be used to screen women aged 30 years and older, and should be used in women of any age who have unclear Pap test results. After the age of 30, women should have HPV testing at the same frequency as a Pap test.  Colorectal cancer can be detected and often prevented. Most routine colorectal cancer screening begins at the age of 50 years and continues through age 75 years. However, your health care provider may recommend screening at an earlier age if you have risk factors for colon cancer. On a yearly basis, your health care provider may provide home test kits to check for hidden blood in the stool. Use of a small camera at the end of a tube, to directly examine the colon (sigmoidoscopy or colonoscopy), can detect the earliest forms of colorectal cancer. Talk to your health care provider about this at age 50, when routine screening begins. Direct  exam of the colon should be repeated every 5-10 years through age 75 years, unless early forms of pre-cancerous polyps or small growths are found.  People who are at an increased risk for hepatitis B should be screened for this virus. You are considered at high risk for hepatitis B if:  You were born in a country where hepatitis B occurs often. Talk with your health care provider about which countries are considered high risk.  Your parents were born in a high-risk country and you have not received a shot to protect against hepatitis B (hepatitis B vaccine).  You have HIV or AIDS.  You use needles to inject street drugs.  You live with, or have sex with, someone who has hepatitis B.  You get hemodialysis treatment.  You take certain medicines for conditions like cancer, organ transplantation, and autoimmune conditions.  Hepatitis C blood testing is recommended for all people born from 1945 through 1965 and any individual with known risks for hepatitis C.  Practice safe sex. Use condoms and avoid high-risk sexual practices to reduce the spread of sexually transmitted infections (STIs). STIs include gonorrhea, chlamydia, syphilis, trichomonas, herpes, HPV, and human immunodeficiency virus (HIV). Herpes, HIV, and HPV are viral illnesses that have no cure. They can result in disability, cancer, and death.  You should be screened for sexually transmitted illnesses (STIs) including gonorrhea and chlamydia if:  You are sexually active and are younger than 24 years.  You are older than 24 years and your health care provider tells you that you are at risk for this type of infection.  Your sexual activity has changed since you were last screened and you are at an increased risk for chlamydia or gonorrhea. Ask your health care provider if you are at risk.  If you are at risk of being infected with HIV, it is recommended that you take a prescription medicine daily to prevent HIV infection. This is  called preexposure prophylaxis (PrEP). You are considered at risk if:  You are a heterosexual woman, are sexually active, and are at increased risk for HIV infection.  You take drugs by injection.  You are sexually active with a partner who has HIV.  Talk with your health care provider about whether you are at high risk of being infected with HIV. If you choose to begin PrEP, you should first be tested for HIV. You should then be tested every 3 months for as long as you are taking PrEP.  Osteoporosis is a disease in which the bones lose minerals and strength   with aging. This can result in serious bone fractures or breaks. The risk of osteoporosis can be identified using a bone density scan. Women ages 65 years and over and women at risk for fractures or osteoporosis should discuss screening with their health care providers. Ask your health care provider whether you should take a calcium supplement or vitamin D to reduce the rate of osteoporosis.  Menopause can be associated with physical symptoms and risks. Hormone replacement therapy is available to decrease symptoms and risks. You should talk to your health care provider about whether hormone replacement therapy is right for you.  Use sunscreen. Apply sunscreen liberally and repeatedly throughout the day. You should seek shade when your shadow is shorter than you. Protect yourself by wearing long sleeves, pants, a wide-brimmed hat, and sunglasses year round, whenever you are outdoors.  Once a month, do a whole body skin exam, using a mirror to look at the skin on your back. Tell your health care provider of new moles, moles that have irregular borders, moles that are larger than a pencil eraser, or moles that have changed in shape or color.  Stay current with required vaccines (immunizations).  Influenza vaccine. All adults should be immunized every year.  Tetanus, diphtheria, and acellular pertussis (Td, Tdap) vaccine. Pregnant women should  receive 1 dose of Tdap vaccine during each pregnancy. The dose should be obtained regardless of the length of time since the last dose. Immunization is preferred during the 27th-36th week of gestation. An adult who has not previously received Tdap or who does not know her vaccine status should receive 1 dose of Tdap. This initial dose should be followed by tetanus and diphtheria toxoids (Td) booster doses every 10 years. Adults with an unknown or incomplete history of completing a 3-dose immunization series with Td-containing vaccines should begin or complete a primary immunization series including a Tdap dose. Adults should receive a Td booster every 10 years.  Varicella vaccine. An adult without evidence of immunity to varicella should receive 2 doses or a second dose if she has previously received 1 dose. Pregnant females who do not have evidence of immunity should receive the first dose after pregnancy. This first dose should be obtained before leaving the health care facility. The second dose should be obtained 4-8 weeks after the first dose.  Human papillomavirus (HPV) vaccine. Females aged 13-26 years who have not received the vaccine previously should obtain the 3-dose series. The vaccine is not recommended for use in pregnant females. However, pregnancy testing is not needed before receiving a dose. If a female is found to be pregnant after receiving a dose, no treatment is needed. In that case, the remaining doses should be delayed until after the pregnancy. Immunization is recommended for any person with an immunocompromised condition through the age of 26 years if she did not get any or all doses earlier. During the 3-dose series, the second dose should be obtained 4-8 weeks after the first dose. The third dose should be obtained 24 weeks after the first dose and 16 weeks after the second dose.  Zoster vaccine. One dose is recommended for adults aged 60 years or older unless certain conditions are  present.  Measles, mumps, and rubella (MMR) vaccine. Adults born before 1957 generally are considered immune to measles and mumps. Adults born in 1957 or later should have 1 or more doses of MMR vaccine unless there is a contraindication to the vaccine or there is laboratory evidence of immunity to   each of the three diseases. A routine second dose of MMR vaccine should be obtained at least 28 days after the first dose for students attending postsecondary schools, health care workers, or international travelers. People who received inactivated measles vaccine or an unknown type of measles vaccine during 1963-1967 should receive 2 doses of MMR vaccine. People who received inactivated mumps vaccine or an unknown type of mumps vaccine before 1979 and are at high risk for mumps infection should consider immunization with 2 doses of MMR vaccine. For females of childbearing age, rubella immunity should be determined. If there is no evidence of immunity, females who are not pregnant should be vaccinated. If there is no evidence of immunity, females who are pregnant should delay immunization until after pregnancy. Unvaccinated health care workers born before 1957 who lack laboratory evidence of measles, mumps, or rubella immunity or laboratory confirmation of disease should consider measles and mumps immunization with 2 doses of MMR vaccine or rubella immunization with 1 dose of MMR vaccine.  Pneumococcal 13-valent conjugate (PCV13) vaccine. When indicated, a person who is uncertain of her immunization history and has no record of immunization should receive the PCV13 vaccine. An adult aged 19 years or older who has certain medical conditions and has not been previously immunized should receive 1 dose of PCV13 vaccine. This PCV13 should be followed with a dose of pneumococcal polysaccharide (PPSV23) vaccine. The PPSV23 vaccine dose should be obtained at least 8 weeks after the dose of PCV13 vaccine. An adult aged 19  years or older who has certain medical conditions and previously received 1 or more doses of PPSV23 vaccine should receive 1 dose of PCV13. The PCV13 vaccine dose should be obtained 1 or more years after the last PPSV23 vaccine dose.  Pneumococcal polysaccharide (PPSV23) vaccine. When PCV13 is also indicated, PCV13 should be obtained first. All adults aged 65 years and older should be immunized. An adult younger than age 65 years who has certain medical conditions should be immunized. Any person who resides in a nursing home or long-term care facility should be immunized. An adult smoker should be immunized. People with an immunocompromised condition and certain other conditions should receive both PCV13 and PPSV23 vaccines. People with human immunodeficiency virus (HIV) infection should be immunized as soon as possible after diagnosis. Immunization during chemotherapy or radiation therapy should be avoided. Routine use of PPSV23 vaccine is not recommended for American Indians, [place] Natives, or people younger than 65 years unless there are medical conditions that require PPSV23 vaccine. When indicated, people who have unknown immunization and have no record of immunization should receive PPSV23 vaccine. One-time revaccination 5 years after the first dose of PPSV23 is recommended for people aged 19-64 years who have chronic kidney failure, nephrotic syndrome, asplenia, or immunocompromised conditions. People who received 1-2 doses of PPSV23 before age 65 years should receive another dose of PPSV23 vaccine at age 65 years or later if at least 5 years have passed since the previous dose. Doses of PPSV23 are not needed for people immunized with PPSV23 at or after age 65 years.  Meningococcal vaccine. Adults with asplenia or persistent complement component deficiencies should receive 2 doses of quadrivalent meningococcal conjugate (MenACWY-D) vaccine. The doses should be obtained at least 2 months apart.  Microbiologists working with certain meningococcal bacteria, military recruits, people at risk during an outbreak, and people who travel to or live in countries with a high rate of meningitis should be immunized. A first-year college student up through age   21 years who is living in a residence hall should receive a dose if she did not receive a dose on or after her 16th birthday. Adults who have certain high-risk conditions should receive one or more doses of vaccine.  Hepatitis A vaccine. Adults who wish to be protected from this disease, have certain high-risk conditions, work with hepatitis A-infected animals, work in hepatitis A research labs, or travel to or work in countries with a high rate of hepatitis A should be immunized. Adults who were previously unvaccinated and who anticipate close contact with an international adoptee during the first 60 days after arrival in the [place] from a country with a high rate of hepatitis A should be immunized.  Hepatitis B vaccine. Adults who wish to be protected from this disease, have certain high-risk conditions, may be exposed to blood or other infectious body fluids, are household contacts or sex partners of hepatitis B positive people, are clients or workers in certain care facilities, or travel to or work in countries with a high rate of hepatitis B should be immunized.  Haemophilus influenzae type b (Hib) vaccine. A previously unvaccinated person with asplenia or sickle cell disease or having a scheduled splenectomy should receive 1 dose of Hib vaccine. Regardless of previous immunization, a recipient of a hematopoietic stem cell transplant should receive a 3-dose series 6-12 months after her successful transplant. Hib vaccine is not recommended for adults with HIV infection. Preventive Services / Frequency Ages 64 to 68 years  Blood pressure check.** / Every 1 to 2 years.  Lipid and cholesterol check.** / Every 5 years beginning at age  22.  Clinical breast exam.** / Every 3 years for women in their 88s and 53s.  BRCA-related cancer risk assessment.** / For women who have family members with a BRCA-related cancer (breast, ovarian, tubal, or peritoneal cancers).  Pap test.** / Every 2 years from ages 90 through 51. Every 3 years starting at age 21 through age 56 or 3 with a history of 3 consecutive normal Pap tests.  HPV screening.** / Every 3 years from ages 24 through ages 1 to 46 with a history of 3 consecutive normal Pap tests.  Hepatitis C blood test.** / For any individual with known risks for hepatitis C.  Skin self-exam. / Monthly.  Influenza vaccine. / Every year.  Tetanus, diphtheria, and acellular pertussis (Tdap, Td) vaccine.** / Consult your health care provider. Pregnant women should receive 1 dose of Tdap vaccine during each pregnancy. 1 dose of Td every 10 years.  Varicella vaccine.** / Consult your health care provider. Pregnant females who do not have evidence of immunity should receive the first dose after pregnancy.  HPV vaccine. / 3 doses over 6 months, if 72 and younger. The vaccine is not recommended for use in pregnant females. However, pregnancy testing is not needed before receiving a dose.  Measles, mumps, rubella (MMR) vaccine.** / You need at least 1 dose of MMR if you were born in 1957 or later. You may also need a 2nd dose. For females of childbearing age, rubella immunity should be determined. If there is no evidence of immunity, females who are not pregnant should be vaccinated. If there is no evidence of immunity, females who are pregnant should delay immunization until after pregnancy.  Pneumococcal 13-valent conjugate (PCV13) vaccine.** / Consult your health care provider.  Pneumococcal polysaccharide (PPSV23) vaccine.** / 1 to 2 doses if you smoke cigarettes or if you have certain conditions.  Meningococcal vaccine.** /  1 dose if you are age 19 to 21 years and a first-year college  student living in a residence hall, or have one of several medical conditions, you need to get vaccinated against meningococcal disease. You may also need additional booster doses.  Hepatitis A vaccine.** / Consult your health care provider.  Hepatitis B vaccine.** / Consult your health care provider.  Haemophilus influenzae type b (Hib) vaccine.** / Consult your health care provider. Ages 40 to 64 years  Blood pressure check.** / Every 1 to 2 years.  Lipid and cholesterol check.** / Every 5 years beginning at age 20 years.  Lung cancer screening. / Every year if you are aged 55-80 years and have a 30-pack-year history of smoking and currently smoke or have quit within the past 15 years. Yearly screening is stopped once you have quit smoking for at least 15 years or develop a health problem that would prevent you from having lung cancer treatment.  Clinical breast exam.** / Every year after age 40 years.  BRCA-related cancer risk assessment.** / For women who have family members with a BRCA-related cancer (breast, ovarian, tubal, or peritoneal cancers).  Mammogram.** / Every year beginning at age 40 years and continuing for as long as you are in good health. Consult with your health care provider.  Pap test.** / Every 3 years starting at age 30 years through age 65 or 70 years with a history of 3 consecutive normal Pap tests.  HPV screening.** / Every 3 years from ages 30 years through ages 65 to 70 years with a history of 3 consecutive normal Pap tests.  Fecal occult blood test (FOBT) of stool. / Every year beginning at age 50 years and continuing until age 75 years. You may not need to do this test if you get a colonoscopy every 10 years.  Flexible sigmoidoscopy or colonoscopy.** / Every 5 years for a flexible sigmoidoscopy or every 10 years for a colonoscopy beginning at age 50 years and continuing until age 75 years.  Hepatitis C blood test.** / For all people born from 1945 through  1965 and any individual with known risks for hepatitis C.  Skin self-exam. / Monthly.  Influenza vaccine. / Every year.  Tetanus, diphtheria, and acellular pertussis (Tdap/Td) vaccine.** / Consult your health care provider. Pregnant women should receive 1 dose of Tdap vaccine during each pregnancy. 1 dose of Td every 10 years.  Varicella vaccine.** / Consult your health care provider. Pregnant females who do not have evidence of immunity should receive the first dose after pregnancy.  Zoster vaccine.** / 1 dose for adults aged 60 years or older.  Measles, mumps, rubella (MMR) vaccine.** / You need at least 1 dose of MMR if you were born in 1957 or later. You may also need a 2nd dose. For females of childbearing age, rubella immunity should be determined. If there is no evidence of immunity, females who are not pregnant should be vaccinated. If there is no evidence of immunity, females who are pregnant should delay immunization until after pregnancy.  Pneumococcal 13-valent conjugate (PCV13) vaccine.** / Consult your health care provider.  Pneumococcal polysaccharide (PPSV23) vaccine.** / 1 to 2 doses if you smoke cigarettes or if you have certain conditions.  Meningococcal vaccine.** / Consult your health care provider.  Hepatitis A vaccine.** / Consult your health care provider.  Hepatitis B vaccine.** / Consult your health care provider.  Haemophilus influenzae type b (Hib) vaccine.** / Consult your health care provider. Ages 65   years and over  Blood pressure check.** / Every 1 to 2 years.  Lipid and cholesterol check.** / Every 5 years beginning at age 22 years.  Lung cancer screening. / Every year if you are aged 73-80 years and have a 30-pack-year history of smoking and currently smoke or have quit within the past 15 years. Yearly screening is stopped once you have quit smoking for at least 15 years or develop a health problem that would prevent you from having lung cancer  treatment.  Clinical breast exam.** / Every year after age 4 years.  BRCA-related cancer risk assessment.** / For women who have family members with a BRCA-related cancer (breast, ovarian, tubal, or peritoneal cancers).  Mammogram.** / Every year beginning at age 40 years and continuing for as long as you are in good health. Consult with your health care provider.  Pap test.** / Every 3 years starting at age 9 years through age 34 or 91 years with 3 consecutive normal Pap tests. Testing can be stopped between 65 and 70 years with 3 consecutive normal Pap tests and no abnormal Pap or HPV tests in the past 10 years.  HPV screening.** / Every 3 years from ages 57 years through ages 64 or 45 years with a history of 3 consecutive normal Pap tests. Testing can be stopped between 65 and 70 years with 3 consecutive normal Pap tests and no abnormal Pap or HPV tests in the past 10 years.  Fecal occult blood test (FOBT) of stool. / Every year beginning at age 15 years and continuing until age 17 years. You may not need to do this test if you get a colonoscopy every 10 years.  Flexible sigmoidoscopy or colonoscopy.** / Every 5 years for a flexible sigmoidoscopy or every 10 years for a colonoscopy beginning at age 86 years and continuing until age 71 years.  Hepatitis C blood test.** / For all people born from [birth year] through 1963 and any individual with known risks for hepatitis C.  Osteoporosis screening.** / A one-time screening for women ages 83 years and over and women at risk for fractures or osteoporosis.  Skin self-exam. / Monthly.  Influenza vaccine. / Every year.  Tetanus, diphtheria, and acellular pertussis (Tdap/Td) vaccine.** / 1 dose of Td every 10 years.  Varicella vaccine.** / Consult your health care provider.  Zoster vaccine.** / 1 dose for adults aged 61 years or older.  Pneumococcal 13-valent conjugate (PCV13) vaccine.** / Consult your health care provider.  Pneumococcal  polysaccharide (PPSV23) vaccine.** / 1 dose for all adults aged 28 years and older.  Meningococcal vaccine.** / Consult your health care provider.  Hepatitis A vaccine.** / Consult your health care provider.  Hepatitis B vaccine.** / Consult your health care provider.  Haemophilus influenzae type b (Hib) vaccine.** / Consult your health care provider. ** Family history and personal history of risk and conditions may change your health care provider's recommendations. Document Released: 08/26/2001 Document Revised: 11/14/2013 Document Reviewed: 11/25/2010 [facility] Patient Information 2015 [place], [place]. This information is not intended to replace advice given to you by your health care provider. Make sure you discuss any questions you have with your health care provider.

## 2014-09-28 ENCOUNTER — Other Ambulatory Visit: Payer: Federal, State, Local not specified - PPO

## 2014-10-03 ENCOUNTER — Ambulatory Visit (HOSPITAL_COMMUNITY)
Admission: RE | Admit: 2014-10-03 | Discharge: 2014-10-03 | Disposition: A | Discharge status: Home / Self Care | Payer: Federal, State, Local not specified - PPO | Source: Ambulatory Visit | Attending: Obstetrics & Gynecology | Admitting: Obstetrics & Gynecology

## 2014-10-03 DIAGNOSIS — Z13820 Encounter for screening for osteoporosis: Secondary | ICD-10-CM | POA: Insufficient documentation

## 2014-10-03 DIAGNOSIS — M8580 Other specified disorders of bone density and structure, unspecified site: Secondary | ICD-10-CM | POA: Insufficient documentation

## 2014-10-09 ENCOUNTER — Telehealth: Payer: Self-pay | Admitting: *Deleted

## 2014-10-09 NOTE — Telephone Encounter (Signed)
Spoke to patient and advised her of her bone density results.  I have forwarded the patients result to Dr. Dionisio Paschal at [facility].

## 2014-10-19 ENCOUNTER — Ambulatory Visit
Admission: RE | Admit: 2014-10-19 | Discharge: 2014-10-19 | Disposition: A | Discharge status: Home / Self Care | Payer: Federal, State, Local not specified - PPO | Source: Ambulatory Visit | Attending: Obstetrics & Gynecology | Admitting: Obstetrics & Gynecology

## 2014-10-19 DIAGNOSIS — D242 Benign neoplasm of left breast: Secondary | ICD-10-CM

## 2015-05-29 ENCOUNTER — Other Ambulatory Visit: Payer: Self-pay | Admitting: Family Medicine

## 2015-05-29 ENCOUNTER — Other Ambulatory Visit (INDEPENDENT_AMBULATORY_CARE_PROVIDER_SITE_OTHER): Payer: Federal, State, Local not specified - PPO

## 2015-05-29 DIAGNOSIS — E559 Vitamin D deficiency, unspecified: Secondary | ICD-10-CM | POA: Diagnosis not present

## 2015-05-29 DIAGNOSIS — E785 Hyperlipidemia, unspecified: Secondary | ICD-10-CM

## 2015-05-29 LAB — LIPID PANEL
CHOLESTEROL: 179 mg/dL (ref 0–200)
HDL: 43.9 mg/dL (ref >39.00)
LDL Cholesterol: 113 mg/dL — ABNORMAL HIGH (ref 0–99)
NonHDL: 134.7
TRIGLYCERIDES: 108 mg/dL (ref 0.0–149.0)
Total CHOL/HDL Ratio: 4
VLDL: 21.6 mg/dL (ref 0.0–40.0)

## 2015-05-29 LAB — BASIC METABOLIC PANEL
BUN: 17 mg/dL (ref 6–23)
CO2: 27 mEq/L (ref 19–32)
CREATININE: 0.66 mg/dL (ref 0.40–1.20)
Calcium: 10.1 mg/dL (ref 8.4–10.5)
Chloride: 106 mEq/L (ref 96–112)
GFR: 114.83 mL/min (ref >60.00)
GLUCOSE: 107 mg/dL — AB (ref 70–99)
POTASSIUM: 3.8 meq/L (ref 3.5–5.1)
Sodium: 140 mEq/L (ref 135–145)

## 2015-05-29 LAB — VITAMIN D 25 HYDROXY (VIT D DEFICIENCY, FRACTURES): VITD: 51.12 ng/mL (ref 30.00–100.00)

## 2015-05-30 ENCOUNTER — Ambulatory Visit (INDEPENDENT_AMBULATORY_CARE_PROVIDER_SITE_OTHER): Payer: Federal, State, Local not specified - PPO | Admitting: Family Medicine

## 2015-05-30 ENCOUNTER — Encounter: Payer: Self-pay | Admitting: Family Medicine

## 2015-05-30 VITALS — BP 124/62 | HR 103 | Temp 98.3°F | Ht 59.0 in | Wt 136.5 lb

## 2015-05-30 DIAGNOSIS — G5702 Lesion of sciatic nerve, left lower limb: Secondary | ICD-10-CM | POA: Diagnosis not present

## 2015-05-30 DIAGNOSIS — M5442 Lumbago with sciatica, left side: Secondary | ICD-10-CM | POA: Diagnosis not present

## 2015-05-30 MED ORDER — DICLOFENAC SODIUM 75 MG PO TBEC: 75.0000 mg | DELAYED_RELEASE_TABLET | Freq: Two times a day (BID) | ORAL | Status: DC

## 2015-05-30 MED ORDER — CYCLOBENZAPRINE HCL 10 MG PO TABS
5.0000 mg | ORAL_TABLET | Freq: Every day | ORAL | Status: DC
Start: 2015-05-30 — End: 2016-06-10

## 2015-05-30 NOTE — Progress Notes (Signed)
Pre visit review using our clinic review tool, if applicable. No additional management support is needed unless otherwise documented below in the visit note. 

## 2015-05-30 NOTE — Progress Notes (Signed)
Dr. Frederico Hamman T. Rayetta Veith, MD, [facility] [place], [address] Phone: [phone] Fax: [phone]  05/30/2015  Patient: Kelsey Yoder, MRN: [id number], DOB: October 13, 1945, 69 y.o.  Primary Physician:  Ria Bush, MD   Chief Complaint  Patient presents with  . Back Pain    Radiate down left leg x 1 month   Subjective:   Kelsey Yoder is a 69 y.o. very pleasant female patient who presents with the following: Back Pain  ongoing for approximately: 1 mo The patient has had back pain before - minimal. The back pain is localized into the lumbar spine and they also describe L radiculopathy.  Pain in L back and down to her knee. Ongoing for well more than a month. Last night, it was not good, and it seems like it hurts more at night.   No prior history of back problems .Has used some aspercreme, etc.  Tylenol did not help.   No numbness or tingling. No bowel or bladder incontinence. No focal weakness. Prior interventions: heat, nsaids Physical therapy: No Chiropractic manipulations: No Acupuncture: No Osteopathic manipulation: No Heat or cold: Minimal effect  Past Medical History, Surgical History, Family History, Medications, Allergies have been reviewed and updated if relevant.  Patient Active Problem List   Diagnosis Date Noted  . Advanced care planning/counseling discussion 06/02/2014  . History of colonic polyps 06/02/2014  . Possible left breast mass 10/11/2013  . Achilles tendonitis 03/04/2013  . Healthcare maintenance 05/27/2012  . Aortic valve sclerosis   . Vitamin D deficiency   . Mild hyperlipidemia   . Osteopenia     Past Medical History  Diagnosis Date  . Aortic valve sclerosis     last echo 04/2009  . Osteopenia   . Sinus drainage   . 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 PONV (postoperative nausea and vomiting)     post-op nausea    Past Surgical History    Procedure Laterality Date  . Tonsillectomy  1976  . Total abdominal hysterectomy w/ bilateral salpingoophorectomy  2008    h/o abnormal pap smears  . Cataract extraction Bilateral 2013  . Dental surgery      dental implants  . Breast biopsy Right 2010    benign   . Carpal tunnel release Right 04/25/2013    CARPAL TUNNEL RELEASE;  Surgeon: Tennis Must, MD;  Location: [facility]  . Esophagogastroduodenoscopy  2005    antral gastropathy, neg H pylori screen ([facility])  . Colonoscopy  2002    rectosigmoid bulge thought 2/2 fibroids  . 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 Marital Status: Married    Spouse Name: N/A  . Number of Children: N/A  . Years of Education: N/A   Occupational History  . Not on file.   Social History Main Topics  . Smoking status: Never Smoker   . Smokeless tobacco: Never Used  . Alcohol Use: No  . Drug Use: No  . Sexual Activity:    Partners: Male    Birth Control/ Protection: Surgical   Other Topics Concern  . Not on file   Social History Narrative   Caffeine: occasional   Lives alone, no pets   Occupation: retired, was in Armed forces operational officer   Edu: master's   Activity: goes to gym 3-4x/wk   Diet: some water,  fruits and vegetables daily    Family History  Problem Relation Age of Onset  . Diabetes Mother     double amputee  . Diabetes Father   . Stroke Sister   . Lupus Sister     X 2  . Diabetes Brother   . Cancer Brother     duodenal, prostate   . Stroke Maternal Grandmother   . Stroke Maternal Grandfather   . Congestive Heart Failure Mother     No Known Allergies  Medication list reviewed and updated in full in [facility].  GEN: No fevers, chills. Nontoxic. Primarily MSK c/o today. MSK: Detailed in the HPI GI: tolerating PO intake without difficulty Neuro: As above  Otherwise the pertinent positives of the ROS are noted above.     Objective:   Blood pressure 124/62, pulse 103, temperature 98.3 F (36.8 C), temperature source Oral, height 4\' 11"  (1.499 m), weight 136 lb 8 oz (61.916 kg).  Gen: Well-developed,well-nourished,in no acute distress; alert,appropriate and cooperative throughout examination HEENT: Normocephalic and atraumatic without obvious abnormalities.  Ears, externally no deformities Pulm: Breathing comfortably in no respiratory distress Range of motion at  the waist: Flexion, rotation and lateral bending: preserved  No echymosis or edema Rises to examination table with no difficulty Gait: minimally antalgic  Inspection/Deformity: No abnormality Paraspinus T:  L side l3-s1  B Ankle Dorsiflexion (L5,4): 5/5 B Great Toe Dorsiflexion (L5,4): 5/5 Heel Walk (L5): WNL Toe Walk (S1): WNL Rise/Squat (L4): WNL, mild pain  SENSORY B Medial Foot (L4): WNL B Dorsum (L5): WNL B Lateral (S1): WNL Light Touch: WNL Pinprick: WNL  REFLEXES Knee (L4): 2+ Ankle (S1): 2+  B SLR, seated: neg B SLR, supine: neg B FABER: neg B Reverse FABER: TTP on the L B Greater Troch: NT B Log Roll: neg B Stork: NT B Sciatic Notch: NT  HIP EXAM: SIDE: b ROM: Abduction, Flexion, Internal and External range of motion: full Pain with terminal IROM and EROM: none GTB: NT SLR: NEG Knees: No effusion FABER: NT REVERSE FABER: + L Piriformis: TTP at direct palpation Str: flexion: 5/5 abduction: 5/5 adduction: 5/5 Strength testing non-tender  Radiology: No results found.  Assessment and Plan:   Left-sided low back pain with left-sided sciatica  Piriformis syndrome of left side  Piriformis and radicular symptoms hopefully secondary from primary back  Anatomy reviewed. Conservative algorithms for acute back pain generally begin with the following: NSAIDS, Muscle Relaxants, Mild pain medication  Start with medications, core rehab, and progress from there following low back pain algorithm. No red flags  are present.  The patient was given a handout describing the anatomy and rehabilitation of the following condition: Piriformis Syndrome Also given a handout with more extensive Piriformis stretching, hip flexor and abductor strengthening, ham stretching  Rec deep massage, explained self-massage with ball  Follow-up: if not improved in 3-4 weeks  New Prescriptions   CYCLOBENZAPRINE (FLEXERIL) 10 MG TABLET    Take 0.5-1 tablets (5-10 mg total) by mouth at bedtime.   DICLOFENAC (VOLTAREN) 75 MG EC TABLET    Take 1 tablet (75 mg total) by mouth 2 (two) times daily.   Signed,  Maud Deed. Junah Yam, MD   Patient's Medications  New Prescriptions   CYCLOBENZAPRINE (FLEXERIL) 10 MG TABLET    Take 0.5-1 tablets (5-10 mg total) by mouth at bedtime.   DICLOFENAC (VOLTAREN) 75 MG EC TABLET    Take 1 tablet (75 mg total) by mouth 2 (two) times daily.  Previous Medications   CALCIUM CARB-CHOLECALCIFEROL 600-800 MG-UNIT TABS    Take 1 tablet by mouth daily.   FISH OIL-OMEGA-3 FATTY ACIDS 1000 MG CAPSULE    Take 1 g by mouth daily.   MULTIPLE VITAMIN (MULTIVITAMIN) TABLET    Take 1 tablet by mouth daily.  Modified Medications   No medications on file  Discontinued Medications   CHOLECALCIFEROL (VITAMIN D3) 3000 UNITS TABS    Take 1 tablet by mouth daily.   CYANOCOBALAMIN (VITAMIN B 12 PO)    Take 1,000 mcg by mouth daily.   NUTRITIONAL SUPPLEMENTS (ESTROVEN MOOD & MEMORY) TABS    Take 1 tablet by mouth daily.

## 2015-06-05 ENCOUNTER — Encounter: Payer: Self-pay | Admitting: Family Medicine

## 2015-06-05 ENCOUNTER — Ambulatory Visit (INDEPENDENT_AMBULATORY_CARE_PROVIDER_SITE_OTHER): Payer: Federal, State, Local not specified - PPO | Admitting: Family Medicine

## 2015-06-05 ENCOUNTER — Telehealth: Payer: Self-pay | Admitting: Family Medicine

## 2015-06-05 VITALS — BP 124/62 | HR 64 | Temp 97.9°F | Ht 59.84 in | Wt 134.8 lb

## 2015-06-05 DIAGNOSIS — G4700 Insomnia, unspecified: Secondary | ICD-10-CM

## 2015-06-05 DIAGNOSIS — M8580 Other specified disorders of bone density and structure, unspecified site: Secondary | ICD-10-CM | POA: Diagnosis not present

## 2015-06-05 DIAGNOSIS — R0989 Other specified symptoms and signs involving the circulatory and respiratory systems: Secondary | ICD-10-CM | POA: Diagnosis not present

## 2015-06-05 DIAGNOSIS — Z23 Encounter for immunization: Secondary | ICD-10-CM | POA: Diagnosis not present

## 2015-06-05 DIAGNOSIS — E785 Hyperlipidemia, unspecified: Secondary | ICD-10-CM

## 2015-06-05 DIAGNOSIS — Z86010 Personal history of colonic polyps: Secondary | ICD-10-CM

## 2015-06-05 DIAGNOSIS — Z7189 Other specified counseling: Secondary | ICD-10-CM

## 2015-06-05 DIAGNOSIS — Z0000 Encounter for general adult medical examination without abnormal findings: Secondary | ICD-10-CM | POA: Diagnosis not present

## 2015-06-05 DIAGNOSIS — E559 Vitamin D deficiency, unspecified: Secondary | ICD-10-CM | POA: Diagnosis not present

## 2015-06-05 MED ORDER — TRAZODONE HCL 50 MG PO TABS: 25.0000 mg | ORAL_TABLET | Freq: Every evening | ORAL | Status: DC | PRN

## 2015-06-05 NOTE — Telephone Encounter (Signed)
Forgot to discuss with patient- ok to try trazodone 1/2 - 1 tablet nightly for insomnia. Update [place] with effect. Sent to pharmacy.

## 2015-06-05 NOTE — Assessment & Plan Note (Signed)
Advanced directive: has at home, asked to bring copy.  HCPOA is sister Kelsey Yoder 

## 2015-06-05 NOTE — Assessment & Plan Note (Addendum)
Continue calcium and vit D daily. DEXA 2014, consider rpt 2019.

## 2015-06-05 NOTE — Assessment & Plan Note (Signed)
Check carotid US today.

## 2015-06-05 NOTE — Assessment & Plan Note (Signed)
Preventative protocols reviewed and updated unless pt declined. Discussed healthy diet and lifestyle.  

## 2015-06-05 NOTE — Assessment & Plan Note (Addendum)
Stable off meds. LDL 113. If truly had PVD (carotid stenosis) consider adding statin.

## 2015-06-05 NOTE — Telephone Encounter (Signed)
Message left advising patient.  

## 2015-06-05 NOTE — Assessment & Plan Note (Signed)
Good levels with supplementation. 

## 2015-06-05 NOTE — Assessment & Plan Note (Signed)
Failed OTC remedies. Will trial trazodone 25-50mg  prn.

## 2015-06-05 NOTE — Patient Instructions (Addendum)
We will refer you for local colonoscopy Flu shot today. For left neck artery bruit, we will check carotid artery ultrasounds. We will call you to set this up. Nice to see you today, call us with questions. Return as needed or in 1 year for next physical.  Health Maintenance, Female Adopting a healthy lifestyle and getting preventive care can go a long way to promote health and wellness. Talk with your health care provider about what schedule of regular examinations is right for you. This is a good chance for you to check in with your provider about disease prevention and staying healthy. In between checkups, there are plenty of things you can do on your own. Experts have done a lot of research about which lifestyle changes and preventive measures are most likely to keep you healthy. Ask your health care provider for more information. WEIGHT AND DIET  Eat a healthy diet  Be sure to include plenty of vegetables, fruits, low-fat dairy products, and lean protein.  Do not eat a lot of foods high in solid fats, added sugars, or salt.  Get regular exercise. This is one of the most important things you can do for your health.  Most adults should exercise for at least 150 minutes each week. The exercise should increase your heart rate and make you sweat (moderate-intensity exercise).  Most adults should also do strengthening exercises at least twice a week. This is in addition to the moderate-intensity exercise.  Maintain a healthy weight  Body mass index (BMI) is a measurement that can be used to identify possible weight problems. It estimates body fat based on height and weight. Your health care provider can help determine your BMI and help you achieve or maintain a healthy weight.  For females 44 years of age and older:   A BMI below 18.5 is considered underweight.  A BMI of 18.5 to 24.9 is normal.  A BMI of 25 to 29.9 is considered overweight.  A BMI of 30 and above is considered obese.   Watch levels of cholesterol and blood lipids  You should start having your blood tested for lipids and cholesterol at 67 years of age, then have this test every 5 years.  You may need to have your cholesterol levels checked more often if:  Your lipid or cholesterol levels are high.  You are older than 67 years of age.  You are at high risk for heart disease.  CANCER SCREENING   Lung Cancer  Lung cancer screening is recommended for adults 2-30 years old who are at high risk for lung cancer because of a history of smoking.  A yearly low-dose CT scan of the lungs is recommended for people who:  Currently smoke.  Have quit within the past 15 years.  Have at least a 30-pack-year history of smoking. A pack year is smoking an average of one pack of cigarettes a day for 1 year.  Yearly screening should continue until it has been 15 years since you quit.  Yearly screening should stop if you develop a health problem that would prevent you from having lung cancer treatment.  Breast Cancer  Practice breast self-awareness. This means understanding how your breasts normally appear and feel.  It also means doing regular breast self-exams. Let your health care provider know about any changes, no matter how small.  If you are in your 20s or 30s, you should have a clinical breast exam (CBE) by a health care provider every 1-3 years as  part of a regular health exam.  If you are 40 or older, have a CBE every year. Also consider having a breast X-ray (mammogram) every year.  If you have a family history of breast cancer, talk to your health care provider about genetic screening.  If you are at high risk for breast cancer, talk to your health care provider about having an MRI and a mammogram every year.  Breast cancer gene (BRCA) assessment is recommended for women who have family members with BRCA-related cancers. BRCA-related cancers  include:  Breast.  Ovarian.  Tubal.  Peritoneal cancers.  Results of the assessment will determine the need for genetic counseling and BRCA1 and BRCA2 testing. Cervical Cancer Your health care provider may recommend that you be screened regularly for cancer of the pelvic organs (ovaries, uterus, and vagina). This screening involves a pelvic examination, including checking for microscopic changes to the surface of your cervix (Pap test). You may be encouraged to have this screening done every 3 years, beginning at age 90.  For women ages 68-65, health care providers may recommend pelvic exams and Pap testing every 3 years, or they may recommend the Pap and pelvic exam, combined with testing for human papilloma virus (HPV), every 5 years. Some types of HPV increase your risk of cervical cancer. Testing for HPV may also be done on women of any age with unclear Pap test results.  Other health care providers may not recommend any screening for nonpregnant women who are considered low risk for pelvic cancer and who do not have symptoms. Ask your health care provider if a screening pelvic exam is right for you.  If you have had past treatment for cervical cancer or a condition that could lead to cancer, you need Pap tests and screening for cancer for at least 20 years after your treatment. If Pap tests have been discontinued, your risk factors (such as having a new sexual partner) need to be reassessed to determine if screening should resume. Some women have medical problems that increase the chance of getting cervical cancer. In these cases, your health care provider may recommend more frequent screening and Pap tests. Colorectal Cancer  This type of cancer can be detected and often prevented.  Routine colorectal cancer screening usually begins at 68 years of age and continues through 67 years of age.  Your health care provider may recommend screening at an earlier age if you have risk factors for  colon cancer.  Your health care provider may also recommend using home test kits to check for hidden blood in the stool.  A small camera at the end of a tube can be used to examine your colon directly (sigmoidoscopy or colonoscopy). This is done to check for the earliest forms of colorectal cancer.  Routine screening usually begins at age 70.  Direct examination of the colon should be repeated every 5-10 years through 67 years of age. However, you may need to be screened more often if early forms of precancerous polyps or small growths are found. Skin Cancer  Check your skin from head to toe regularly.  Tell your health care provider about any new moles or changes in moles, especially if there is a change in a mole's shape or color.  Also tell your health care provider if you have a mole that is larger than the size of a pencil eraser.  Always use sunscreen. Apply sunscreen liberally and repeatedly throughout the day.  Protect yourself by wearing long sleeves, pants,  a wide-brimmed hat, and sunglasses whenever you are outside. HEART DISEASE, DIABETES, AND HIGH BLOOD PRESSURE   High blood pressure causes heart disease and increases the risk of stroke. High blood pressure is more likely to develop in:  People who have blood pressure in the high end of the normal range (130-139/85-89 mm Hg).  People who are overweight or obese.  People who are African American.  If you are 107-5 years of age, have your blood pressure checked every 3-5 years. If you are 64 years of age or older, have your blood pressure checked every year. You should have your blood pressure measured twice--once when you are at a hospital or clinic, and once when you are not at a hospital or clinic. Record the average of the two measurements. To check your blood pressure when you are not at a hospital or clinic, you can use:  An automated blood pressure machine at a pharmacy.  A home blood pressure monitor.  If you  are between 38 years and 77 years old, ask your health care provider if you should take aspirin to prevent strokes.  Have regular diabetes screenings. This involves taking a blood sample to check your fasting blood sugar level.  If you are at a normal weight and have a low risk for diabetes, have this test once every three years after 67 years of age.  If you are overweight and have a high risk for diabetes, consider being tested at a younger age or more often. PREVENTING INFECTION  Hepatitis B  If you have a higher risk for hepatitis B, you should be screened for this virus. You are considered at high risk for hepatitis B if:  You were born in a country where hepatitis B is common. Ask your health care provider which countries are considered high risk.  Your parents were born in a high-risk country, and you have not been immunized against hepatitis B (hepatitis B vaccine).  You have HIV or AIDS.  You use needles to inject street drugs.  You live with someone who has hepatitis B.  You have had sex with someone who has hepatitis B.  You get hemodialysis treatment.  You take certain medicines for conditions, including cancer, organ transplantation, and autoimmune conditions. Hepatitis C  Blood testing is recommended for:  Everyone born from [birth year] through 1963.  Anyone with known risk factors for hepatitis C. Sexually transmitted infections (STIs)  You should be screened for sexually transmitted infections (STIs) including gonorrhea and chlamydia if:  You are sexually active and are younger than 67 years of age.  You are older than 67 years of age and your health care provider tells you that you are at risk for this type of infection.  Your sexual activity has changed since you were last screened and you are at an increased risk for chlamydia or gonorrhea. Ask your health care provider if you are at risk.  If you do not have HIV, but are at risk, it may be recommended that you  take a prescription medicine daily to prevent HIV infection. This is called pre-exposure prophylaxis (PrEP). You are considered at risk if:  You are sexually active and do not regularly use condoms or know the HIV status of your partner(s).  You take drugs by injection.  You are sexually active with a partner who has HIV. Talk with your health care provider about whether you are at high risk of being infected with HIV. If you choose to begin  PrEP, you should first be tested for HIV. You should then be tested every 3 months for as long as you are taking PrEP.  PREGNANCY   If you are premenopausal and you may become pregnant, ask your health care provider about preconception counseling.  If you may become pregnant, take 400 to 800 micrograms (mcg) of folic acid every day.  If you want to prevent pregnancy, talk to your health care provider about birth control (contraception). OSTEOPOROSIS AND MENOPAUSE   Osteoporosis is a disease in which the bones lose minerals and strength with aging. This can result in serious bone fractures. Your risk for osteoporosis can be identified using a bone density scan.  If you are 41 years of age or older, or if you are at risk for osteoporosis and fractures, ask your health care provider if you should be screened.  Ask your health care provider whether you should take a calcium or vitamin D supplement to lower your risk for osteoporosis.  Menopause may have certain physical symptoms and risks.  Hormone replacement therapy may reduce some of these symptoms and risks. Talk to your health care provider about whether hormone replacement therapy is right for you.  HOME CARE INSTRUCTIONS   Schedule regular health, dental, and eye exams.  Stay current with your immunizations.   Do not use any tobacco products including cigarettes, chewing tobacco, or electronic cigarettes.  If you are pregnant, do not drink alcohol.  If you are breastfeeding, limit how  much and how often you drink alcohol.  Limit alcohol intake to no more than 1 drink per day for nonpregnant women. One drink equals 12 ounces of beer, 5 ounces of wine, or 1 ounces of hard liquor.  Do not use street drugs.  Do not share needles.  Ask your health care provider for help if you need support or information about quitting drugs.  Tell your health care provider if you often feel depressed.  Tell your health care provider if you have ever been abused or do not feel safe at home.   This information is not intended to replace advice given to you by your health care provider. Make sure you discuss any questions you have with your health care provider.   Document Released: 01/13/2011 Document Revised: 07/21/2014 Document Reviewed: 06/01/2013 Elsevier Interactive Patient Education [employer].

## 2015-06-05 NOTE — Progress Notes (Signed)
BP 124/62 mmHg  Pulse 64  Temp(Src) 97.9 F (36.6 C) (Oral)  Ht 4' 11.84" (1.52 m)  Wt 134 lb 12 oz (61.122 kg)  BMI 26.46 kg/m2   CC: CPE  Subjective:    Patient ID: Kelsey Yoder, female    DOB: 1945-10-24, 69 y.o.   MRN: [id number]  HPI: Kelsey Yoder is a 69 y.o. female presenting on 06/05/2015 for Annual Exam   Seen by Dr Lorelei Pont last week for L piriformis syndrome with sciatica - treated with NSAID and flexeril - doing better.  Osteopenia - taking calcium and vitamin D. Also on actonel for last 5 years. DEXA scan done 09/2012. Showing persistent osteopenia of hips. Previously on estrogen, stopped when unable to get prior formulation of estrogen.   Noticing increased insomnia trouble over the past 6-8 months. Both sleep initiation and maintenance insomnia. Has tried OTC remedies like unisom which occasionally helps. Has also tried melatonin.   Preventative: COLONOSCOPY Date: 2005 polyps, diverticulosis, rpt 7-10 yrs ([facility]). Interested in rpt.  Well woman - OBGYN 09/2014 (Dr. Harolyn Rutherford). S/p hysterectomy 2008 for h/o abnormal paps. Sees yearly. 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 Regular with calcium and vit D  Flu shot - today. Tetanus shot - done 2010.  Pneumovax 2014. prevnar 05/2014 zostavax done 2012 Advanced directive: has at home, asked to bring copy. HCPOA is sister Kelsey Yoder  Caffeine: occasional Lives alone, no pets Occupation: retired 2012, was in Armed forces operational officer Edu: master's Activity: goes to gym 3-4x/wk (water aerobics and weight lifting) Diet: some water, fruits and vegetables daily   Relevant past medical, surgical, family and social history reviewed and updated as indicated. Interim medical history since our last visit reviewed. Allergies and medications reviewed and updated. Current Outpatient Prescriptions on File Prior to Visit    Medication Sig  . Calcium Carb-Cholecalciferol 600-800 MG-UNIT TABS Take 1 tablet by mouth daily.  . cyclobenzaprine (FLEXERIL) 10 MG tablet Take 0.5-1 tablets (5-10 mg total) by mouth at bedtime.  . diclofenac (VOLTAREN) 75 MG EC tablet Take 1 tablet (75 mg total) by mouth 2 (two) times daily.  . fish oil-omega-3 fatty acids 1000 MG capsule Take 1 g by mouth daily.  . Multiple Vitamin (MULTIVITAMIN) tablet Take 1 tablet by mouth daily.   No current facility-administered medications on file prior to visit.    Review of Systems  Constitutional: Negative for fever, chills, activity change, appetite change, fatigue and unexpected weight change.  HENT: Negative for hearing loss.   Eyes: Negative for visual disturbance.  Respiratory: Negative for cough, chest tightness, shortness of breath and wheezing.   Cardiovascular: Negative for chest pain, palpitations and leg swelling.  Gastrointestinal: Negative for nausea, vomiting, abdominal pain, diarrhea, constipation, blood in stool and abdominal distention.  Genitourinary: Negative for hematuria and difficulty urinating.  Musculoskeletal: Negative for myalgias, arthralgias and neck pain.  Skin: Negative for rash.  Neurological: Negative for dizziness, seizures, syncope and headaches.  Hematological: Negative for adenopathy. Does not bruise/bleed easily.  Psychiatric/Behavioral: Negative for dysphoric mood. The patient is not nervous/anxious.    Per HPI unless specifically indicated in ROS section     Objective:    BP 124/62 mmHg  Pulse 64  Temp(Src) 97.9 F (36.6 C) (Oral)  Ht 4' 11.84" (1.52 m)  Wt 134 lb 12 oz (61.122 kg)  BMI 26.46 kg/m2  Wt Readings from Last 3 Encounters:  06/05/15  134 lb 12 oz (61.122 kg)  05/30/15 136 lb 8 oz (61.916 kg)  09/25/14 136 lb (61.689 kg)    Physical Exam  Constitutional: She is oriented to person, place, and time. She appears well-developed and well-nourished. No distress.  HENT:  Head:  Normocephalic and atraumatic.  Right Ear: Hearing, tympanic membrane, external ear and ear canal normal.  Left Ear: Hearing, tympanic membrane, external ear and ear canal normal.  Nose: Nose normal.  Mouth/Throat: Uvula is midline, oropharynx is clear and moist and mucous membranes are normal. No oropharyngeal exudate, posterior oropharyngeal edema or posterior oropharyngeal erythema.  Eyes: Conjunctivae and EOM are normal. Pupils are equal, round, and reactive to light. No scleral icterus.  Neck: Normal range of motion. Neck supple. Carotid bruit is present (L sided). No thyromegaly present.  Cardiovascular: Normal rate, regular rhythm, normal heart sounds and intact distal pulses.   No murmur heard. Pulses:      Radial pulses are 2+ on the right side, and 2+ on the left side.  Pulmonary/Chest: Effort normal and breath sounds normal. No respiratory distress. She has no wheezes. She has no rales.  Abdominal: Soft. Bowel sounds are normal. She exhibits no distension and no mass. There is no tenderness. There is no rebound and no guarding.  Musculoskeletal: Normal range of motion. She exhibits no edema.  Lymphadenopathy:    She has no cervical adenopathy.  Neurological: She is alert and oriented to person, place, and time.  CN grossly intact, station and gait intact  Skin: Skin is warm and dry. No rash noted.  Psychiatric: She has a normal mood and affect. Her behavior is normal. Judgment and thought content normal.  Nursing note and vitals reviewed.  Results for orders placed or performed in visit on 05/29/15  Lipid panel  Result Value Ref Range   Cholesterol 179 0 - 200 mg/dL   Triglycerides 108.0 0.0 - 149.0 mg/dL   HDL 43.90 >39.00 mg/dL   VLDL 21.6 0.0 - 40.0 mg/dL   LDL Cholesterol 113 (H) 0 - 99 mg/dL   Total CHOL/HDL Ratio 4    NonHDL 123XX123   Basic metabolic panel  Result Value Ref Range   Sodium 140 135 - 145 mEq/L   Potassium 3.8 3.5 - 5.1 mEq/L   Chloride 106 96 - 112  mEq/L   CO2 27 19 - 32 mEq/L   Glucose, Bld 107 (H) 70 - 99 mg/dL   BUN 17 6 - 23 mg/dL   Creatinine, Ser 0.66 0.40 - 1.20 mg/dL   Calcium 10.1 8.4 - 10.5 mg/dL   GFR 114.83 >60.00 mL/min  VITAMIN D 25 Hydroxy (Vit-D Deficiency, Fractures)  Result Value Ref Range   VITD 51.12 30.00 - 100.00 ng/mL      Assessment & Plan:   Problem List Items Addressed This Visit    Vitamin D deficiency    Good levels with supplementation      Osteopenia    Continue calcium and vit D daily. DEXA 2014, consider rpt 2019.       Mild hyperlipidemia    Stable off meds. LDL 113. If truly had PVD (carotid stenosis) consider adding statin.      Left carotid bruit    Check carotid US today.      Relevant Orders   Carotid   Insomnia    Failed OTC remedies. Will trial trazodone 25-50mg  prn.      History of colonic polyps   Relevant Orders   Ambulatory referral to  Gastroenterology   Healthcare maintenance - Primary    Preventative protocols reviewed and updated unless pt declined. Discussed healthy diet and lifestyle.       Advanced care planning/counseling discussion    Advanced directive: has at home, asked to bring copy. HCPOA is sister Kelsey Yoder       Other Visit Diagnoses    Need for influenza vaccination        Relevant Orders    Flu Vaccine QUAD 36+ mos PF IM (Fluarix & Fluzone Quad PF) (Completed)        Follow up plan: Return in about 1 year (around 06/04/2016), or as needed, for annual exam, prior fasting for blood work.

## 2015-06-05 NOTE — Progress Notes (Signed)
Pre visit review using our clinic review tool, if applicable. No additional management support is needed unless otherwise documented below in the visit note. 

## 2015-06-11 ENCOUNTER — Encounter: Payer: Self-pay | Admitting: Internal Medicine

## 2015-06-20 ENCOUNTER — Ambulatory Visit: Payer: Federal, State, Local not specified - PPO

## 2015-06-20 DIAGNOSIS — R0989 Other specified symptoms and signs involving the circulatory and respiratory systems: Secondary | ICD-10-CM | POA: Diagnosis not present

## 2015-06-26 ENCOUNTER — Encounter: Payer: Federal, State, Local not specified - PPO | Admitting: Internal Medicine

## 2015-08-15 HISTORY — PX: COLONOSCOPY: SHX174

## 2015-08-21 ENCOUNTER — Ambulatory Visit (AMBULATORY_SURGERY_CENTER): Payer: Self-pay | Admitting: *Deleted

## 2015-08-21 VITALS — Ht 59.5 in | Wt 138.4 lb

## 2015-08-21 DIAGNOSIS — Z1211 Encounter for screening for malignant neoplasm of colon: Secondary | ICD-10-CM

## 2015-08-21 MED ORDER — NA SULFATE-K SULFATE-MG SULF 17.5-3.13-1.6 GM/177ML PO SOLN: 1.0000 | Freq: Once | ORAL | Status: DC

## 2015-08-21 NOTE — Progress Notes (Signed)
No egg or soy allergy known to patient  No issues with past sedation with any surgeries  or procedures, no intubation problems - does have hx of post op N/V No diet pills per patient No home 02 use per patient  No blood thinners per patient

## 2015-09-04 ENCOUNTER — Encounter: Payer: Self-pay | Admitting: Internal Medicine

## 2015-09-04 ENCOUNTER — Ambulatory Visit (AMBULATORY_SURGERY_CENTER): Payer: Federal, State, Local not specified - PPO | Admitting: Internal Medicine

## 2015-09-04 VITALS — BP 113/59 | HR 90 | Temp 96.5°F | Resp 17 | Ht 59.5 in | Wt 138.0 lb

## 2015-09-04 DIAGNOSIS — D123 Benign neoplasm of transverse colon: Secondary | ICD-10-CM | POA: Diagnosis not present

## 2015-09-04 DIAGNOSIS — K635 Polyp of colon: Secondary | ICD-10-CM

## 2015-09-04 DIAGNOSIS — Z1211 Encounter for screening for malignant neoplasm of colon: Secondary | ICD-10-CM

## 2015-09-04 MED ORDER — SODIUM CHLORIDE 0.9 % IV SOLN: 500.0000 mL | INTRAVENOUS | Status: DC

## 2015-09-04 NOTE — Op Note (Signed)
[facility]  [address] [place], [address]   COLONOSCOPY PROCEDURE REPORT  PATIENT: Kelsey, Yoder  MR#: [id number] BIRTHDATE: [birth year] , 31  yrs. old GENDER: female ENDOSCOPIST: Jerene Bears, MD REFERRED [id number] Danise Mina, M.D. PROCEDURE DATE:  09/04/2015 PROCEDURE:   Colonoscopy, screening and Colonoscopy with snare polypectomy First Screening Colonoscopy - Avg.  risk and is 50 yrs.  old or older Yes.  Prior Negative Screening - Now for repeat screening. N/A  History of Adenoma - Now for follow-up colonoscopy & has been > or = to 3 yrs.  N/A  Polyps removed today? Yes ASA CLASS:   Class III INDICATIONS:Screening for colonic neoplasia.   Last colonoscopy 2005 MEDICATIONS: Monitored anesthesia care and Propofol 200 mg IV  DESCRIPTION OF PROCEDURE:   After the risks benefits and alternatives of the procedure were thoroughly explained, informed consent was obtained.  The digital rectal exam revealed no rectal mass.   The LB [id number]  endoscope was introduced through the anus and advanced to the cecum, which was identified by both the appendix and ileocecal valve. No adverse events experienced. The quality of the prep was good.  (Suprep was used)  The instrument was then slowly withdrawn as the colon was fully examined. Estimated blood loss is zero unless otherwise noted in this procedure report.  COLON FINDINGS: A sessile polyp measuring 6 mm in size with a mucous cap was found at the hepatic flexure.  A polypectomy was performed with a cold snare.  The resection was complete, the polyp tissue was completely retrieved and sent to histology.   There was mild diverticulosis noted throughout the entire examined colon. Retroflexed views revealed small internal hemorrhoids. The time to cecum = 2.9 Withdrawal time = 8.2   The scope was withdrawn and the procedure completed. COMPLICATIONS: There were no immediate complications.  ENDOSCOPIC  IMPRESSION: 1.   Sessile polyp was found at the hepatic flexure; polypectomy was performed with a cold snare 2.   Mild diverticulosis was noted throughout the entire examined colon  RECOMMENDATIONS: 1.  Await pathology results 2.  High fiber diet 3.  Timing of repeat colonoscopy will be determined by pathology findings. 4.  You will receive a letter within 1-2 weeks with the results of your biopsy as well as final recommendations.  Please call my office if you have not received a letter after 3 weeks.  eSigned:  Jerene Bears, MD 09/04/2015 9:23 AM   cc:  the patient, PCP

## 2015-09-04 NOTE — Progress Notes (Signed)
No problems noted in the recovery room. maw 

## 2015-09-04 NOTE — Progress Notes (Signed)
Called to room to assist during endoscopic procedure.  Patient ID and intended procedure confirmed with present staff. Received instructions for my participation in the procedure from the performing physician.  

## 2015-09-04 NOTE — Progress Notes (Addendum)
No egg or soy allergy known to patient  No issues with past sedation with any surgeries  or procedures, no intubation problems -hx post op nausea vomiting   No diet pills per patient No home 02 use per patient  No blood thinners per patient

## 2015-09-04 NOTE — Patient Instructions (Signed)
YOU HAD AN ENDOSCOPIC PROCEDURE TODAY AT THE [facility]:   Refer to the procedure report that was given to you for any specific questions about what was found during the examination.  If the procedure report does not answer your questions, please call your gastroenterologist to clarify.  If you requested that your care partner not be given the details of your procedure findings, then the procedure report has been included in a sealed envelope for you to review at your convenience later.  YOU SHOULD EXPECT: Some feelings of bloating in the abdomen. Passage of more gas than usual.  Walking can help get rid of the air that was put into your GI tract during the procedure and reduce the bloating. If you had a lower endoscopy (such as a colonoscopy or flexible sigmoidoscopy) you may notice spotting of blood in your stool or on the toilet paper. If you underwent a bowel prep for your procedure, you may not have a normal bowel movement for a few days.  Please Note:  You might notice some irritation and congestion in your nose or some drainage.  This is from the oxygen used during your procedure.  There is no need for concern and it should clear up in a day or so.  SYMPTOMS TO REPORT IMMEDIATELY:   Following lower endoscopy (colonoscopy or flexible sigmoidoscopy):  Excessive amounts of blood in the stool  Significant tenderness or worsening of abdominal pains  Swelling of the abdomen that is new, acute  Fever of 100F or higher  For urgent or emergent issues, a gastroenterologist can be reached at any hour by calling [phone].   DIET: Your first meal following the procedure should be a small meal and then it is ok to progress to your normal diet. Heavy or fried foods are harder to digest and may make you feel nauseous or bloated.  Likewise, meals heavy in dairy and vegetables can increase bloating.  Drink plenty of fluids but you should avoid alcoholic beverages for 24  hours.  ACTIVITY:  You should plan to take it easy for the rest of today and you should NOT DRIVE or use heavy machinery until tomorrow (because of the sedation medicines used during the test).    FOLLOW UP: Our staff will call the number listed on your records the next business day following your procedure to check on you and address any questions or concerns that you may have regarding the information given to you following your procedure. If we do not reach you, we will leave a message.  However, if you are feeling well and you are not experiencing any problems, there is no need to return our call.  We will assume that you have returned to your regular daily activities without incident.  If any biopsies were taken you will be contacted by phone or by letter within the next 1-3 weeks.  Please call us at [phone] if you have not heard about the biopsies in 3 weeks.    SIGNATURES/CONFIDENTIALITY: You and/or your care partner have signed paperwork which will be entered into your electronic medical record.  These signatures attest to the fact that that the information above on your After Visit Summary has been reviewed and is understood.  Full responsibility of the confidentiality of this discharge information lies with you and/or your care-partner.    Handouts were given to your care partner on polyps, diverticulosis, and a high fiber diet with liberal fluid intake. You may resume your   current medications today. Await biopsy results. Please call if any questions or concerns.

## 2015-09-04 NOTE — Progress Notes (Signed)
  [facility] Post-op Note  Patient: Kelsey Yoder  Procedure(s) Performed: colonoscopy  Patient Location: [facility] - Recovery Area  Anesthesia Type: Deep Sedation/Propofol  Level of Consciousness: awake, oriented and patient cooperative  Airway and Oxygen Therapy: Patient Spontanous Breathing  Post-op Pain: none  Post-op Assessment:  Post-op Vital signs reviewed, Patient's Cardiovascular Status Stable, Respiratory Function Stable, Patent Airway, No signs of Nausea or vomiting and Pain level controlled  Post-op Vital Signs: Reviewed and stable  Complications: No apparent anesthesia complications  Amany Rando E 9:23 AM

## 2015-09-05 ENCOUNTER — Telehealth: Payer: Self-pay

## 2015-09-05 NOTE — Telephone Encounter (Signed)
  Follow up Call-  Call back number 09/04/2015  Post procedure Call Back phone  # [phone]  Permission to leave phone message Yes     Patient questions:  Do you have a fever, pain , or abdominal swelling? No. Pain Score  0 *  Have you tolerated food without any problems? Yes.    Have you been able to return to your normal activities? Yes.    Do you have any questions about your discharge instructions: Diet   No. Medications  No. Follow up visit  No.  Do you have questions or concerns about your Care? No.  Actions: * If pain score is 4 or above: No action needed, pain <4.

## 2015-09-06 ENCOUNTER — Encounter: Payer: Self-pay | Admitting: Family Medicine

## 2015-09-10 ENCOUNTER — Encounter: Payer: Self-pay | Admitting: Internal Medicine

## 2015-09-12 ENCOUNTER — Encounter: Payer: Self-pay | Admitting: Family Medicine

## 2015-10-09 ENCOUNTER — Ambulatory Visit (INDEPENDENT_AMBULATORY_CARE_PROVIDER_SITE_OTHER): Payer: Federal, State, Local not specified - PPO | Admitting: Obstetrics & Gynecology

## 2015-10-09 ENCOUNTER — Other Ambulatory Visit: Payer: Self-pay | Admitting: Obstetrics & Gynecology

## 2015-10-09 ENCOUNTER — Encounter: Payer: Self-pay | Admitting: Obstetrics & Gynecology

## 2015-10-09 VITALS — BP 136/73 | HR 105 | Resp 18 | Ht 59.5 in | Wt 135.0 lb

## 2015-10-09 DIAGNOSIS — N951 Menopausal and female climacteric states: Secondary | ICD-10-CM

## 2015-10-09 DIAGNOSIS — Z01419 Encounter for gynecological examination (general) (routine) without abnormal findings: Secondary | ICD-10-CM | POA: Diagnosis not present

## 2015-10-09 DIAGNOSIS — D249 Benign neoplasm of unspecified breast: Secondary | ICD-10-CM | POA: Insufficient documentation

## 2015-10-09 DIAGNOSIS — D242 Benign neoplasm of left breast: Secondary | ICD-10-CM | POA: Diagnosis not present

## 2015-10-09 MED ORDER — GABAPENTIN 600 MG PO TABS: 600.0000 mg | ORAL_TABLET | Freq: Every day | ORAL | Status: DC

## 2015-10-09 NOTE — Patient Instructions (Signed)
Preventive Care for Adults, Female A healthy lifestyle and preventive care can promote health and wellness. Preventive health guidelines for women include the following key practices.  A routine yearly physical is a good way to check with your health care provider about your health and preventive screening. It is a chance to share any concerns and updates on your health and to receive a thorough exam.  Visit your dentist for a routine exam and preventive care every 6 months. Brush your teeth twice a day and floss once a day. Good oral hygiene prevents tooth decay and gum disease.  The frequency of eye exams is based on your age, health, family medical history, use of contact lenses, and other factors. Follow your health care provider's recommendations for frequency of eye exams.  Eat a healthy diet. Foods like vegetables, fruits, whole grains, low-fat dairy products, and lean protein foods contain the nutrients you need without too many calories. Decrease your intake of foods high in solid fats, added sugars, and salt. Eat the right amount of calories for you.Get information about a proper diet from your health care provider, if necessary.  Regular physical exercise is one of the most important things you can do for your health. Most adults should get at least 150 minutes of moderate-intensity exercise (any activity that increases your heart rate and causes you to sweat) each week. In addition, most adults need muscle-strengthening exercises on 2 or more days a week.  Maintain a healthy weight. The body mass index (BMI) is a screening tool to identify possible weight problems. It provides an estimate of body fat based on height and weight. Your health care provider can find your BMI and can help you achieve or maintain a healthy weight.For adults 20 years and older:  A BMI below 18.5 is considered underweight.  A BMI of 18.5 to 24.9 is normal.  A BMI of 25 to 29.9 is considered overweight.  A  BMI of 30 and above is considered obese.  Maintain normal blood lipids and cholesterol levels by exercising and minimizing your intake of saturated fat. Eat a balanced diet with plenty of fruit and vegetables. Blood tests for lipids and cholesterol should begin at age 45 and be repeated every 5 years. If your lipid or cholesterol levels are high, you are over 50, or you are at high risk for heart disease, you may need your cholesterol levels checked more frequently.Ongoing high lipid and cholesterol levels should be treated with medicines if diet and exercise are not working.  If you smoke, find out from your health care provider how to quit. If you do not use tobacco, do not start.  Lung cancer screening is recommended for adults aged 45-80 years who are at high risk for developing lung cancer because of a history of smoking. A yearly low-dose CT scan of the lungs is recommended for people who have at least a 30-pack-year history of smoking and are a current smoker or have quit within the past 15 years. A pack year of smoking is smoking an average of 1 pack of cigarettes a day for 1 year (for example: 1 pack a day for 30 years or 2 packs a day for 15 years). Yearly screening should continue until the smoker has stopped smoking for at least 15 years. Yearly screening should be stopped for people who develop a health problem that would prevent them from having lung cancer treatment.  If you are pregnant, do not drink alcohol. If you are  breastfeeding, be very cautious about drinking alcohol. If you are not pregnant and choose to drink alcohol, do not have more than 1 drink per day. One drink is considered to be 12 ounces (355 mL) of beer, 5 ounces (148 mL) of wine, or 1.5 ounces (44 mL) of liquor.  Avoid use of street drugs. Do not share needles with anyone. Ask for help if you need support or instructions about stopping the use of drugs.  High blood pressure causes heart disease and increases the risk  of stroke. Your blood pressure should be checked at least every 1 to 2 years. Ongoing high blood pressure should be treated with medicines if weight loss and exercise do not work.  If you are 55-79 years old, ask your health care provider if you should take aspirin to prevent strokes.  Diabetes screening is done by taking a blood sample to check your blood glucose level after you have not eaten for a certain period of time (fasting). If you are not overweight and you do not have risk factors for diabetes, you should be screened once every 3 years starting at age 45. If you are overweight or obese and you are 40-70 years of age, you should be screened for diabetes every year as part of your cardiovascular risk assessment.  Breast cancer screening is essential preventive care for women. You should practice "breast self-awareness." This means understanding the normal appearance and feel of your breasts and may include breast self-examination. Any changes detected, no matter how small, should be reported to a health care provider. Women in their 20s and 30s should have a clinical breast exam (CBE) by a health care provider as part of a regular health exam every 1 to 3 years. After age 40, women should have a CBE every year. Starting at age 40, women should consider having a mammogram (breast X-ray test) every year. Women who have a family history of breast cancer should talk to their health care provider about genetic screening. Women at a high risk of breast cancer should talk to their health care providers about having an MRI and a mammogram every year.  Breast cancer gene (BRCA)-related cancer risk assessment is recommended for women who have family members with BRCA-related cancers. BRCA-related cancers include breast, ovarian, tubal, and peritoneal cancers. Having family members with these cancers may be associated with an increased risk for harmful changes (mutations) in the breast cancer genes BRCA1 and  BRCA2. Results of the assessment will determine the need for genetic counseling and BRCA1 and BRCA2 testing.  Your health care provider may recommend that you be screened regularly for cancer of the pelvic organs (ovaries, uterus, and vagina). This screening involves a pelvic examination, including checking for microscopic changes to the surface of your cervix (Pap test). You may be encouraged to have this screening done every 3 years, beginning at age 21.  For women ages 30-65, health care providers may recommend pelvic exams and Pap testing every 3 years, or they may recommend the Pap and pelvic exam, combined with testing for human papilloma virus (HPV), every 5 years. Some types of HPV increase your risk of cervical cancer. Testing for HPV may also be done on women of any age with unclear Pap test results.  Other health care providers may not recommend any screening for nonpregnant women who are considered low risk for pelvic cancer and who do not have symptoms. Ask your health care provider if a screening pelvic exam is right for   you.  If you have had past treatment for cervical cancer or a condition that could lead to cancer, you need Pap tests and screening for cancer for at least 20 years after your treatment. If Pap tests have been discontinued, your risk factors (such as having a new sexual partner) need to be reassessed to determine if screening should resume. Some women have medical problems that increase the chance of getting cervical cancer. In these cases, your health care provider may recommend more frequent screening and Pap tests.  Colorectal cancer can be detected and often prevented. Most routine colorectal cancer screening begins at the age of 50 years and continues through age 75 years. However, your health care provider may recommend screening at an earlier age if you have risk factors for colon cancer. On a yearly basis, your health care provider may provide home test kits to check  for hidden blood in the stool. Use of a small camera at the end of a tube, to directly examine the colon (sigmoidoscopy or colonoscopy), can detect the earliest forms of colorectal cancer. Talk to your health care provider about this at age 50, when routine screening begins. Direct exam of the colon should be repeated every 5-10 years through age 75 years, unless early forms of precancerous polyps or small growths are found.  People who are at an increased risk for hepatitis B should be screened for this virus. You are considered at high risk for hepatitis B if:  You were born in a country where hepatitis B occurs often. Talk with your health care provider about which countries are considered high risk.  Your parents were born in a high-risk country and you have not received a shot to protect against hepatitis B (hepatitis B vaccine).  You have HIV or AIDS.  You use needles to inject street drugs.  You live with, or have sex with, someone who has hepatitis B.  You get hemodialysis treatment.  You take certain medicines for conditions like cancer, organ transplantation, and autoimmune conditions.  Hepatitis C blood testing is recommended for all people born from 1945 through 1965 and any individual with known risks for hepatitis C.  Practice safe sex. Use condoms and avoid high-risk sexual practices to reduce the spread of sexually transmitted infections (STIs). STIs include gonorrhea, chlamydia, syphilis, trichomonas, herpes, HPV, and human immunodeficiency virus (HIV). Herpes, HIV, and HPV are viral illnesses that have no cure. They can result in disability, cancer, and death.  You should be screened for sexually transmitted illnesses (STIs) including gonorrhea and chlamydia if:  You are sexually active and are younger than 24 years.  You are older than 24 years and your health care provider tells you that you are at risk for this type of infection.  Your sexual activity has changed  since you were last screened and you are at an increased risk for chlamydia or gonorrhea. Ask your health care provider if you are at risk.  If you are at risk of being infected with HIV, it is recommended that you take a prescription medicine daily to prevent HIV infection. This is called preexposure prophylaxis (PrEP). You are considered at risk if:  You are sexually active and do not regularly use condoms or know the HIV status of your partner(s).  You take drugs by injection.  You are sexually active with a partner who has HIV.  Talk with your health care provider about whether you are at high risk of being infected with HIV. If   you choose to begin PrEP, you should first be tested for HIV. You should then be tested every 3 months for as long as you are taking PrEP.  Osteoporosis is a disease in which the bones lose minerals and strength with aging. This can result in serious bone fractures or breaks. The risk of osteoporosis can be identified using a bone density scan. Women ages 67 years and over and women at risk for fractures or osteoporosis should discuss screening with their health care providers. Ask your health care provider whether you should take a calcium supplement or vitamin D to reduce the rate of osteoporosis.  Menopause can be associated with physical symptoms and risks. Hormone replacement therapy is available to decrease symptoms and risks. You should talk to your health care provider about whether hormone replacement therapy is right for you.  Use sunscreen. Apply sunscreen liberally and repeatedly throughout the day. You should seek shade when your shadow is shorter than you. Protect yourself by wearing long sleeves, pants, a wide-brimmed hat, and sunglasses year round, whenever you are outdoors.  Once a month, do a whole body skin exam, using a mirror to look at the skin on your back. Tell your health care provider of new moles, moles that have irregular borders, moles that  are larger than a pencil eraser, or moles that have changed in shape or color.  Stay current with required vaccines (immunizations).  Influenza vaccine. All adults should be immunized every year.  Tetanus, diphtheria, and acellular pertussis (Td, Tdap) vaccine. Pregnant women should receive 1 dose of Tdap vaccine during each pregnancy. The dose should be obtained regardless of the length of time since the last dose. Immunization is preferred during the 27th-36th week of gestation. An adult who has not previously received Tdap or who does not know her vaccine status should receive 1 dose of Tdap. This initial dose should be followed by tetanus and diphtheria toxoids (Td) booster doses every 10 years. Adults with an unknown or incomplete history of completing a 3-dose immunization series with Td-containing vaccines should begin or complete a primary immunization series including a Tdap dose. Adults should receive a Td booster every 10 years.  Varicella vaccine. An adult without evidence of immunity to varicella should receive 2 doses or a second dose if she has previously received 1 dose. Pregnant females who do not have evidence of immunity should receive the first dose after pregnancy. This first dose should be obtained before leaving the health care facility. The second dose should be obtained 4-8 weeks after the first dose.  Human papillomavirus (HPV) vaccine. Females aged 13-26 years who have not received the vaccine previously should obtain the 3-dose series. The vaccine is not recommended for use in pregnant females. However, pregnancy testing is not needed before receiving a dose. If a female is found to be pregnant after receiving a dose, no treatment is needed. In that case, the remaining doses should be delayed until after the pregnancy. Immunization is recommended for any person with an immunocompromised condition through the age of 61 years if she did not get any or all doses earlier. During the  3-dose series, the second dose should be obtained 4-8 weeks after the first dose. The third dose should be obtained 24 weeks after the first dose and 16 weeks after the second dose.  Zoster vaccine. One dose is recommended for adults aged 30 years or older unless certain conditions are present.  Measles, mumps, and rubella (MMR) vaccine. Adults born  before 1957 generally are considered immune to measles and mumps. Adults born in 1957 or later should have 1 or more doses of MMR vaccine unless there is a contraindication to the vaccine or there is laboratory evidence of immunity to each of the three diseases. A routine second dose of MMR vaccine should be obtained at least 28 days after the first dose for students attending postsecondary schools, health care workers, or international travelers. People who received inactivated measles vaccine or an unknown type of measles vaccine during 1963-1967 should receive 2 doses of MMR vaccine. People who received inactivated mumps vaccine or an unknown type of mumps vaccine before 1979 and are at high risk for mumps infection should consider immunization with 2 doses of MMR vaccine. For females of childbearing age, rubella immunity should be determined. If there is no evidence of immunity, females who are not pregnant should be vaccinated. If there is no evidence of immunity, females who are pregnant should delay immunization until after pregnancy. Unvaccinated health care workers born before 1957 who lack laboratory evidence of measles, mumps, or rubella immunity or laboratory confirmation of disease should consider measles and mumps immunization with 2 doses of MMR vaccine or rubella immunization with 1 dose of MMR vaccine.  Pneumococcal 13-valent conjugate (PCV13) vaccine. When indicated, a person who is uncertain of his immunization history and has no record of immunization should receive the PCV13 vaccine. All adults 65 years of age and older should receive this  vaccine. An adult aged 19 years or older who has certain medical conditions and has not been previously immunized should receive 1 dose of PCV13 vaccine. This PCV13 should be followed with a dose of pneumococcal polysaccharide (PPSV23) vaccine. Adults who are at high risk for pneumococcal disease should obtain the PPSV23 vaccine at least 8 weeks after the dose of PCV13 vaccine. Adults older than 68 years of age who have normal immune system function should obtain the PPSV23 vaccine dose at least 1 year after the dose of PCV13 vaccine.  Pneumococcal polysaccharide (PPSV23) vaccine. When PCV13 is also indicated, PCV13 should be obtained first. All adults aged 65 years and older should be immunized. An adult younger than age 65 years who has certain medical conditions should be immunized. Any person who resides in a nursing home or long-term care facility should be immunized. An adult smoker should be immunized. People with an immunocompromised condition and certain other conditions should receive both PCV13 and PPSV23 vaccines. People with human immunodeficiency virus (HIV) infection should be immunized as soon as possible after diagnosis. Immunization during chemotherapy or radiation therapy should be avoided. Routine use of PPSV23 vaccine is not recommended for American Indians, [place] Natives, or people younger than 65 years unless there are medical conditions that require PPSV23 vaccine. When indicated, people who have unknown immunization and have no record of immunization should receive PPSV23 vaccine. One-time revaccination 5 years after the first dose of PPSV23 is recommended for people aged 19-64 years who have chronic kidney failure, nephrotic syndrome, asplenia, or immunocompromised conditions. People who received 1-2 doses of PPSV23 before age 65 years should receive another dose of PPSV23 vaccine at age 65 years or later if at least 5 years have passed since the previous dose. Doses of PPSV23 are not  needed for people immunized with PPSV23 at or after age 65 years.  Meningococcal vaccine. Adults with asplenia or persistent complement component deficiencies should receive 2 doses of quadrivalent meningococcal conjugate (MenACWY-D) vaccine. The doses should be obtained   at least 2 months apart. Microbiologists working with certain meningococcal bacteria, [place] recruits, people at risk during an outbreak, and people who travel to or live in countries with a high rate of meningitis should be immunized. A first-year college student up through age 34 years who is living in a residence hall should receive a dose if she did not receive a dose on or after her 16th birthday. Adults who have certain high-risk conditions should receive one or more doses of vaccine.  Hepatitis A vaccine. Adults who wish to be protected from this disease, have certain high-risk conditions, work with hepatitis A-infected animals, work in hepatitis A research labs, or travel to or work in countries with a high rate of hepatitis A should be immunized. Adults who were previously unvaccinated and who anticipate close contact with an international adoptee during the first 60 days after arrival in the [place] from a country with a high rate of hepatitis A should be immunized.  Hepatitis B vaccine. Adults who wish to be protected from this disease, have certain high-risk conditions, may be exposed to blood or other infectious body fluids, are household contacts or sex partners of hepatitis B positive people, are clients or workers in certain care facilities, or travel to or work in countries with a high rate of hepatitis B should be immunized.  Haemophilus influenzae type b (Hib) vaccine. A previously unvaccinated person with asplenia or sickle cell disease or having a scheduled splenectomy should receive 1 dose of Hib vaccine. Regardless of previous immunization, a recipient of a hematopoietic stem cell transplant should receive a  3-dose series 6-12 months after her successful transplant. Hib vaccine is not recommended for adults with HIV infection. Preventive Services / Frequency Ages 35 to 4 years  Blood pressure check.** / Every 3-5 years.  Lipid and cholesterol check.** / Every 5 years beginning at age 60.  Clinical breast exam.** / Every 3 years for women in their 71s and 10s.  BRCA-related cancer risk assessment.** / For women who have family members with a BRCA-related cancer (breast, ovarian, tubal, or peritoneal cancers).  Pap test.** / Every 2 years from ages 76 through 26. Every 3 years starting at age 61 through age 76 or 93 with a history of 3 consecutive normal Pap tests.  HPV screening.** / Every 3 years from ages 37 through ages 60 to 51 with a history of 3 consecutive normal Pap tests.  Hepatitis C blood test.** / For any individual with known risks for hepatitis C.  Skin self-exam. / Monthly.  Influenza vaccine. / Every year.  Tetanus, diphtheria, and acellular pertussis (Tdap, Td) vaccine.** / Consult your health care provider. Pregnant women should receive 1 dose of Tdap vaccine during each pregnancy. 1 dose of Td every 10 years.  Varicella vaccine.** / Consult your health care provider. Pregnant females who do not have evidence of immunity should receive the first dose after pregnancy.  HPV vaccine. / 3 doses over 6 months, if 93 and younger. The vaccine is not recommended for use in pregnant females. However, pregnancy testing is not needed before receiving a dose.  Measles, mumps, rubella (MMR) vaccine.** / You need at least 1 dose of MMR if you were born in 1957 or later. You may also need a 2nd dose. For females of childbearing age, rubella immunity should be determined. If there is no evidence of immunity, females who are not pregnant should be vaccinated. If there is no evidence of immunity, females who are  pregnant should delay immunization until after pregnancy.  Pneumococcal  13-valent conjugate (PCV13) vaccine.** / Consult your health care provider.  Pneumococcal polysaccharide (PPSV23) vaccine.** / 1 to 2 doses if you smoke cigarettes or if you have certain conditions.  Meningococcal vaccine.** / 1 dose if you are age 68 to 8 years and a Market researcher living in a residence hall, or have one of several medical conditions, you need to get vaccinated against meningococcal disease. You may also need additional booster doses.  Hepatitis A vaccine.** / Consult your health care provider.  Hepatitis B vaccine.** / Consult your health care provider.  Haemophilus influenzae type b (Hib) vaccine.** / Consult your health care provider. Ages 7 to 53 years  Blood pressure check.** / Every year.  Lipid and cholesterol check.** / Every 5 years beginning at age 25 years.  Lung cancer screening. / Every year if you are aged 11-80 years and have a 30-pack-year history of smoking and currently smoke or have quit within the past 15 years. Yearly screening is stopped once you have quit smoking for at least 15 years or develop a health problem that would prevent you from having lung cancer treatment.  Clinical breast exam.** / Every year after age 48 years.  BRCA-related cancer risk assessment.** / For women who have family members with a BRCA-related cancer (breast, ovarian, tubal, or peritoneal cancers).  Mammogram.** / Every year beginning at age 41 years and continuing for as long as you are in good health. Consult with your health care provider.  Pap test.** / Every 3 years starting at age 65 years through age 37 or 70 years with a history of 3 consecutive normal Pap tests.  HPV screening.** / Every 3 years from ages 72 years through ages 60 to 40 years with a history of 3 consecutive normal Pap tests.  Fecal occult blood test (FOBT) of stool. / Every year beginning at age 21 years and continuing until age 5 years. You may not need to do this test if you get  a colonoscopy every 10 years.  Flexible sigmoidoscopy or colonoscopy.** / Every 5 years for a flexible sigmoidoscopy or every 10 years for a colonoscopy beginning at age 35 years and continuing until age 48 years.  Hepatitis C blood test.** / For all people born from [birth year] through 1965 and any individual with known risks for hepatitis C.  Skin self-exam. / Monthly.  Influenza vaccine. / Every year.  Tetanus, diphtheria, and acellular pertussis (Tdap/Td) vaccine.** / Consult your health care provider. Pregnant women should receive 1 dose of Tdap vaccine during each pregnancy. 1 dose of Td every 10 years.  Varicella vaccine.** / Consult your health care provider. Pregnant females who do not have evidence of immunity should receive the first dose after pregnancy.  Zoster vaccine.** / 1 dose for adults aged 30 years or older.  Measles, mumps, rubella (MMR) vaccine.** / You need at least 1 dose of MMR if you were born in 1957 or later. You may also need a second dose. For females of childbearing age, rubella immunity should be determined. If there is no evidence of immunity, females who are not pregnant should be vaccinated. If there is no evidence of immunity, females who are pregnant should delay immunization until after pregnancy.  Pneumococcal 13-valent conjugate (PCV13) vaccine.** / Consult your health care provider.  Pneumococcal polysaccharide (PPSV23) vaccine.** / 1 to 2 doses if you smoke cigarettes or if you have certain conditions.  Meningococcal vaccine.** /  Consult your health care provider.  Hepatitis A vaccine.** / Consult your health care provider.  Hepatitis B vaccine.** / Consult your health care provider.  Haemophilus influenzae type b (Hib) vaccine.** / Consult your health care provider. Ages 64 years and over  Blood pressure check.** / Every year.  Lipid and cholesterol check.** / Every 5 years beginning at age 23 years.  Lung cancer screening. / Every year if you  are aged 16-80 years and have a 30-pack-year history of smoking and currently smoke or have quit within the past 15 years. Yearly screening is stopped once you have quit smoking for at least 15 years or develop a health problem that would prevent you from having lung cancer treatment.  Clinical breast exam.** / Every year after age 74 years.  BRCA-related cancer risk assessment.** / For women who have family members with a BRCA-related cancer (breast, ovarian, tubal, or peritoneal cancers).  Mammogram.** / Every year beginning at age 44 years and continuing for as long as you are in good health. Consult with your health care provider.  Pap test.** / Every 3 years starting at age 58 years through age 22 or 39 years with 3 consecutive normal Pap tests. Testing can be stopped between 65 and 70 years with 3 consecutive normal Pap tests and no abnormal Pap or HPV tests in the past 10 years.  HPV screening.** / Every 3 years from ages 64 years through ages 70 or 61 years with a history of 3 consecutive normal Pap tests. Testing can be stopped between 65 and 70 years with 3 consecutive normal Pap tests and no abnormal Pap or HPV tests in the past 10 years.  Fecal occult blood test (FOBT) of stool. / Every year beginning at age 40 years and continuing until age 27 years. You may not need to do this test if you get a colonoscopy every 10 years.  Flexible sigmoidoscopy or colonoscopy.** / Every 5 years for a flexible sigmoidoscopy or every 10 years for a colonoscopy beginning at age 7 years and continuing until age 32 years.  Hepatitis C blood test.** / For all people born from [birth year] through 1963 and any individual with known risks for hepatitis C.  Osteoporosis screening.** / A one-time screening for women ages 30 years and over and women at risk for fractures or osteoporosis.  Skin self-exam. / Monthly.  Influenza vaccine. / Every year.  Tetanus, diphtheria, and acellular pertussis (Tdap/Td)  vaccine.** / 1 dose of Td every 10 years.  Varicella vaccine.** / Consult your health care provider.  Zoster vaccine.** / 1 dose for adults aged 35 years or older.  Pneumococcal 13-valent conjugate (PCV13) vaccine.** / Consult your health care provider.  Pneumococcal polysaccharide (PPSV23) vaccine.** / 1 dose for all adults aged 46 years and older.  Meningococcal vaccine.** / Consult your health care provider.  Hepatitis A vaccine.** / Consult your health care provider.  Hepatitis B vaccine.** / Consult your health care provider.  Haemophilus influenzae type b (Hib) vaccine.** / Consult your health care provider. ** Family history and personal history of risk and conditions may change your health care provider's recommendations.   This information is not intended to replace advice given to you by your health care provider. Make sure you discuss any questions you have with your health care provider.   Document Released: 08/26/2001 Document Revised: 07/21/2014 Document Reviewed: 11/25/2010 Elsevier Interactive Patient Education [employer].

## 2015-10-09 NOTE — Progress Notes (Signed)
GYNECOLOGY CLINIC ANNUAL PREVENTATIVE CARE ENCOUNTER NOTE  Subjective:   Kelsey Yoder is a 70 y.o. PMP female s/p TAH/BSO in 2008 here for a routine annual gynecologic exam.  Current complaints: bothersome hot flashes and night sweats.  Has been on HT in the past, does not want this therapy.   Denies abnormal vaginal bleeding, discharge, pelvic pain, problems with intercourse or other gynecologic concerns.    Gynecologic History No LMP recorded. Patient has had a hysterectomy. 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 Last DEXA scan 10/03/14 Osteopenia of her hip. On adequate calcium (1200 mg/day) and Vitamin D (800 International Units/day) intake, as well as weight bearing exercises. Next bone mineral density scan is recommended in 2018. Colonoscopy 09/04/2015  Benign polyps, mild diverticulosis. High fiber diet recommended   Past Medical History  Diagnosis Date  . Aortic valve sclerosis     last echo 04/2009  . Osteopenia   . Sinus drainage   . 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 PONV (postoperative nausea and vomiting)     post-op nausea  . Cataract     Past Surgical History  Procedure Laterality Date  . Tonsillectomy  1976  . Total abdominal hysterectomy w/ bilateral salpingoophorectomy  2008    h/o abnormal pap smears  . Cataract extraction Bilateral 2013  . Dental surgery      dental implants  . Breast biopsy Right 2010    benign   . Carpal tunnel release Right 04/25/2013    CARPAL TUNNEL RELEASE;  Surgeon: Tennis Must, MD;  Location: [facility]  . Esophagogastroduodenoscopy  2005    antral gastropathy, neg H pylori screen ([facility])  . Colonoscopy  2002    rectosigmoid bulge thought 2/2 fibroids  . Colonoscopy  2005    polyps,  diverticulosis, rpt 7-10 yrs ([facility])  . Colonoscopy  08/2015    HP, mild diverticulosis, rpt 5 yrs  ([facility])    Current Outpatient Prescriptions on File Prior to Visit  Medication Sig Dispense Refill  . Calcium Carb-Cholecalciferol 600-800 MG-UNIT TABS Take 1 tablet by mouth daily.    . fish oil-omega-3 fatty acids 1000 MG capsule Take 1 g by mouth daily.    . Multiple Vitamin (MULTIVITAMIN) tablet Take 1 tablet by mouth daily.    Marland Kitchen OVER THE COUNTER MEDICATION Take 1 capsule by mouth daily. Tumeric    . traZODone (DESYREL) 50 MG tablet Take 0.5-1 tablets (25-50 mg total) by mouth at bedtime as needed for sleep. 30 tablet 1  . cyclobenzaprine (FLEXERIL) 10 MG tablet Take 0.5-1 tablets (5-10 mg total) by mouth at bedtime. (Patient not taking: Reported on 08/21/2015) 30 tablet 1   No current facility-administered medications on file prior to visit.    No Known Allergies  Social History   Social History  . Marital Status: Married    Spouse Name: N/A  . Number of Children: N/A  . Years of Education: N/A   Occupational History  . Not on file.   Social History Main Topics  . Smoking status: Never Smoker   . Smokeless tobacco: Never Used  . Alcohol Use: No  . Drug Use: No  . Sexual Activity:    Partners: Male    Birth Control/ Protection: Surgical   Other Topics Concern  . Not  on file   Social History Narrative   Caffeine: occasional   Lives alone, no pets   Occupation: retired, was in Armed forces operational officer   Edu: master's   Activity: goes to gym 3-4x/wk   Diet: some water, fruits and vegetables daily    Family History  Problem Relation Age of Onset  . Diabetes Mother     double amputee  . Congestive Heart Failure Mother   . Diabetes Father   . Stroke Sister   . Lupus Sister     X 2  . Diabetes Brother   . Cancer Brother     duodenal, prostate   . Stroke Maternal Grandmother   . Stroke Maternal Grandfather   . Colon cancer Neg Hx   . Colon polyps Neg Hx      The following portions of the patient's history were reviewed and updated as appropriate: allergies, current medications, past family history, past medical history, past social history, past surgical history and problem list.  Review of Systems Pertinent items noted in HPI and remainder of comprehensive ROS otherwise negative.   Objective:  BP 136/73 mmHg  Pulse 105  Resp 18  Ht 4' 11.5" (1.511 m)  Wt 135 lb (61.236 kg)  BMI 26.82 kg/m2 CONSTITUTIONAL: Well-developed, well-nourished female in no acute distress.  HENT:  Normocephalic, atraumatic, External right and left ear normal. Oropharynx is clear and moist EYES: Conjunctivae and EOM are normal. Pupils are equal, round, and reactive to light. No scleral icterus.  NECK: Normal range of motion, supple, no masses.  Normal thyroid.  SKIN: Skin is warm and dry. No rash noted. Not diaphoretic. No erythema. No pallor. NEUROLOGIC: Alert and oriented to person, place, and time. Normal reflexes, muscle tone coordination. No cranial nerve deficit noted. PSYCHIATRIC: Normal mood and affect. Normal behavior. Normal judgment and thought content. CARDIOVASCULAR: Normal heart rate noted, regular rhythm RESPIRATORY: Clear to auscultation bilaterally. Effort and breath sounds normal, no problems with respiration noted. BREASTS: Symmetric in size. Small fibroadenomas palpated in left breast, no other masses, skin changes, nipple drainage, or lymphadenopathy bilaterally.  ABDOMEN: Soft, normal bowel sounds, no distention noted.  No tenderness, rebound or guarding.  PELVIC: Normal external female genitalia. Vagina is with significant atrophy, well-healed vaginal cuff. Normal discharge. No masses palpated on bimanual exam  MUSCULOSKELETAL: Normal range of motion. No tenderness.  No cyanosis, clubbing, or edema.  2+ distal pulses.   Assessment:  Annual gynecologic examination Menopausal hot flashes/night sweats   Plan:  Discussed nonhormonal  therapies and lifestyle modifications for hot flashes/night sweats. Patient will like trial of Neurontin, this was prescribed.  Will monitor response. Diagnostic mammogram and and left breast ultrasound ordered as recommended Routine preventative health maintenance measures emphasized. Please refer to After Visit Summary for other counseling recommendations.    Verita Schneiders, MD, [place] Attending Obstetrician & Gynecologist, [facility]

## 2015-10-16 ENCOUNTER — Encounter: Payer: Self-pay | Admitting: Internal Medicine

## 2015-10-16 ENCOUNTER — Ambulatory Visit (INDEPENDENT_AMBULATORY_CARE_PROVIDER_SITE_OTHER): Payer: Federal, State, Local not specified - PPO | Admitting: Internal Medicine

## 2015-10-16 VITALS — BP 132/76 | HR 102 | Temp 98.7°F | Wt 136.0 lb

## 2015-10-16 DIAGNOSIS — R600 Localized edema: Secondary | ICD-10-CM

## 2015-10-16 MED ORDER — FUROSEMIDE 20 MG PO TABS: 20.0000 mg | ORAL_TABLET | Freq: Every day | ORAL | Status: DC

## 2015-10-16 NOTE — Progress Notes (Signed)
Subjective:    Patient ID: Kelsey Yoder, female    DOB: 06/23/1946, 70 y.o.   MRN: [id number]  HPI  Pt presents to the clinic today with c/o bilateral ankle edema. This started 10 days ago. She denies pain, numbness or tingling of lower extremities. She has not noticed improvement in the mornings after elevating her legs, but has noticed her swelling is worse after standing for prolonged periods. She denies high salt diet or recent weight changes. She does have a history of tachycardia and has been evaluated by cardiology, but there has been no mention of heart failure.  Review of Systems  Past Medical History  Diagnosis Date  . Aortic valve sclerosis     last echo 04/2009  . Osteopenia   . Sinus drainage   . 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 PONV (postoperative nausea and vomiting)     post-op nausea  . Cataract     Current Outpatient Prescriptions  Medication Sig Dispense Refill  . Calcium Carb-Cholecalciferol 600-800 MG-UNIT TABS Take 1 tablet by mouth daily.    . cyclobenzaprine (FLEXERIL) 10 MG tablet Take 0.5-1 tablets (5-10 mg total) by mouth at bedtime. 30 tablet 1  . fish oil-omega-3 fatty acids 1000 MG capsule Take 1 g by mouth daily.    Marland Kitchen gabapentin (NEURONTIN) 600 MG tablet Take 1 tablet (600 mg total) by mouth at bedtime. 30 tablet 3  . Multiple Vitamin (MULTIVITAMIN) tablet Take 1 tablet by mouth daily.    Marland Kitchen OVER THE COUNTER MEDICATION Take 1 capsule by mouth daily. Tumeric    . traZODone (DESYREL) 50 MG tablet Take 0.5-1 tablets (25-50 mg total) by mouth at bedtime as needed for sleep. 30 tablet 1  . furosemide (LASIX) 20 MG tablet Take 1 tablet (20 mg total) by mouth daily. 3 tablet 0   No current facility-administered medications for this visit.    No Known Allergies  Family History  Problem Relation Age of Onset  . Diabetes Mother     double amputee  . Congestive Heart Failure Mother   . Diabetes Father   . Stroke Sister   .  Lupus Sister     X 2  . Diabetes Brother   . Cancer Brother     duodenal, prostate   . Stroke Maternal Grandmother   . Stroke Maternal Grandfather   . Colon cancer Neg Hx   . Colon polyps Neg Hx     Social History   Social History  . Marital Status: Married    Spouse Name: N/A  . Number of Children: N/A  . Years of Education: N/A   Occupational History  . Not on file.   Social History Main Topics  . Smoking status: Never Smoker   . Smokeless tobacco: Never Used  . Alcohol Use: No  . Drug Use: No  . Sexual Activity:    Partners: Male    Birth Control/ Protection: Surgical   Other Topics Concern  . Not on file   Social History Narrative   Caffeine: occasional   Lives alone, no pets   Occupation: retired, was in Armed forces operational officer   Edu: master's   Activity: goes to gym 3-4x/wk   Diet: some water, fruits and vegetables daily     Constitutional: Denies fever, malaise, fatigue, headache or abrupt weight changes.  Respiratory: Denies difficulty breathing, shortness of breath, cough or sputum production.   Cardiovascular: Pt reports swelling in her feet. Denies chest pain, chest  tightness, palpitations or swelling in the hands.  Musculoskeletal: Denies difficulty with gait, muscle pain or joint pain.  Skin: Denies redness, rashes, lesions or ulcercations.   No other specific complaints in a complete review of systems (except as listed in HPI above).     Objective:   Physical Exam BP 132/76 mmHg  Pulse 102  Temp(Src) 98.7 F (37.1 C) (Oral)  Wt 136 lb (61.689 kg)  SpO2 98% Wt Readings from Last 3 Encounters:  10/16/15 136 lb (61.689 kg)  10/09/15 135 lb (61.236 kg)  09/04/15 138 lb (62.596 kg)    General: Appears her stated age, well developed, well nourished in NAD. Skin: Warm, dry and intact. No rashes, lesions or ulcerations noted. Neck:  Neck supple, trachea midline. No masses, lumps or thyromegaly present.   Cardiovascular: Normal rate and rhythm.  S1,S2 noted.  No murmur, rubs or gallops noted.  Radial and dorsal pedal pulses 2+ bilaterally. @+ non pitting edema, around ankles bilaterally.  Pulmonary/Chest: Normal effort and positive vesicular breath sounds. No respiratory distress. No wheezes, rales or ronchi noted.  Musculoskeletal: Normal flexion, extension and rotation of bilateral ankles. No difficulty with gait.   BMET    Component Value Date/Time   NA 140 05/29/2015 0819   NA 140 08/13/2011   K 3.8 05/29/2015 0819   K 4.3 08/13/2011   CL 106 05/29/2015 0819   CL 106 08/13/2011   CO2 27 05/29/2015 0819   CO2 21 08/13/2011   GLUCOSE 107* 05/29/2015 0819   BUN 17 05/29/2015 0819   BUN 17 08/13/2011   CREATININE 0.66 05/29/2015 0819   CREATININE 0.91 08/13/2011   CALCIUM 10.1 05/29/2015 0819   CALCIUM 9.6 08/13/2011    Lipid Panel     Component Value Date/Time   CHOL 179 05/29/2015 0819   CHOL 208 08/13/2011   TRIG 108.0 05/29/2015 0819   TRIG 128 08/13/2011   HDL 43.90 05/29/2015 0819   CHOLHDL 4 05/29/2015 0819   VLDL 21.6 05/29/2015 0819   LDLCALC 113* 05/29/2015 0819    CBC    Component Value Date/Time   WBC 6.1 08/13/2011   RBC 4.30 08/13/2011   HGB 13.2 04/25/2013 0703   HCT 41 08/13/2011       Assessment & Plan:   Peripheral edema:  No indication of heart failure at this time Continue elevation of legs periodically throughout the day Discussed low salt diet Lasix 20 mg daily x 3 days eRx forcompression hose  RTC as needed or if symptoms persist or worsen BAITY, REGINA, NP

## 2015-10-16 NOTE — Patient Instructions (Signed)
Edema °Edema is an abnormal buildup of fluids in your body tissues. Edema is somewhat dependent on gravity to pull the fluid to the lowest place in your body. That makes the condition more common in the legs and thighs (lower extremities). Painless swelling of the feet and ankles is common and becomes more likely as you get older. It is also common in looser tissues, like around your eyes.  °When the affected area is squeezed, the fluid may move out of that spot and leave a dent for a few moments. This dent is called pitting.  °CAUSES  °There are many possible causes of edema. Eating too much salt and being on your feet or sitting for a long time can cause edema in your legs and ankles. Hot weather may make edema worse. Common medical causes of edema include: °· Heart failure. °· Liver disease. °· Kidney disease. °· Weak blood vessels in your legs. °· Cancer. °· An injury. °· Pregnancy. °· Some medications. °· Obesity.  °SYMPTOMS  °Edema is usually painless. Your skin may look swollen or shiny.  °DIAGNOSIS  °Your health care provider may be able to diagnose edema by asking about your medical history and doing a physical exam. You may need to have tests such as X-rays, an electrocardiogram, or blood tests to check for medical conditions that may cause edema.  °TREATMENT  °Edema treatment depends on the cause. If you have heart, liver, or kidney disease, you need the treatment appropriate for these conditions. General treatment may include: °· Elevation of the affected body part above the level of your heart. °· Compression of the affected body part. Pressure from elastic bandages or support stockings squeezes the tissues and forces fluid back into the blood vessels. This keeps fluid from entering the tissues. °· Restriction of fluid and salt intake. °· Use of a water pill (diuretic). These medications are appropriate only for some types of edema. They pull fluid out of your body and make you urinate more often. This  gets rid of fluid and reduces swelling, but diuretics can have side effects. Only use diuretics as directed by your health care provider. °HOME CARE INSTRUCTIONS  °· Keep the affected body part above the level of your heart when you are lying down.   °· Do not sit still or stand for prolonged periods.   °· Do not put anything directly under your knees when lying down. °· Do not wear constricting clothing or garters on your upper legs.   °· Exercise your legs to work the fluid back into your blood vessels. This may help the swelling go down.   °· Wear elastic bandages or support stockings to reduce ankle swelling as directed by your health care provider.   °· Eat a low-salt diet to reduce fluid if your health care provider recommends it.   °· Only take medicines as directed by your health care provider.  °SEEK MEDICAL CARE IF:  °· Your edema is not responding to treatment. °· You have heart, liver, or kidney disease and notice symptoms of edema. °· You have edema in your legs that does not improve after elevating them.   °· You have sudden and unexplained weight gain. °SEEK IMMEDIATE MEDICAL CARE IF:  °· You develop shortness of breath or chest pain.   °· You cannot breathe when you lie down. °· You develop pain, redness, or warmth in the swollen areas.   °· You have heart, liver, or kidney disease and suddenly get edema. °· You have a fever and your symptoms suddenly get worse. °MAKE SURE YOU:  °·   Understand these instructions. °· Will watch your condition. °· Will get help right away if you are not doing well or get worse. °  °This information is not intended to replace advice given to you by your health care provider. Make sure you discuss any questions you have with your health care provider. °  °Document Released: 06/30/2005 Document Revised: 07/21/2014 Document Reviewed: 04/22/2013 °Elsevier Interactive Patient Education ©2016 [employer] ° °

## 2015-10-16 NOTE — Progress Notes (Signed)
Pre visit review using our clinic review tool, if applicable. No additional management support is needed unless otherwise documented below in the visit note. 

## 2015-10-23 ENCOUNTER — Ambulatory Visit
Admission: RE | Admit: 2015-10-23 | Discharge: 2015-10-23 | Disposition: A | Discharge status: Home / Self Care | Payer: Federal, State, Local not specified - PPO | Source: Ambulatory Visit | Attending: Obstetrics & Gynecology | Admitting: Obstetrics & Gynecology

## 2015-10-23 DIAGNOSIS — D242 Benign neoplasm of left breast: Secondary | ICD-10-CM

## 2015-12-25 ENCOUNTER — Encounter: Payer: Self-pay | Admitting: Family Medicine

## 2015-12-25 ENCOUNTER — Ambulatory Visit (INDEPENDENT_AMBULATORY_CARE_PROVIDER_SITE_OTHER): Payer: Federal, State, Local not specified - PPO | Admitting: Family Medicine

## 2015-12-25 VITALS — BP 118/64 | HR 68 | Temp 98.0°F | Wt 139.0 lb

## 2015-12-25 DIAGNOSIS — R600 Localized edema: Secondary | ICD-10-CM | POA: Diagnosis not present

## 2015-12-25 LAB — BRAIN NATRIURETIC PEPTIDE: PRO B NATRI PEPTIDE: 22 pg/mL (ref 0.0–100.0)

## 2015-12-25 LAB — COMPREHENSIVE METABOLIC PANEL
ALBUMIN: 3.9 g/dL (ref 3.5–5.2)
ALK PHOS: 151 U/L — AB (ref 39–117)
ALT: 27 U/L (ref 0–35)
AST: 22 U/L (ref 0–37)
BUN: 16 mg/dL (ref 6–23)
CALCIUM: 10 mg/dL (ref 8.4–10.5)
CO2: 29 mEq/L (ref 19–32)
Chloride: 102 mEq/L (ref 96–112)
Creatinine, Ser: 0.64 mg/dL (ref 0.40–1.20)
GFR: 118.77 mL/min (ref >60.00)
Glucose, Bld: 86 mg/dL (ref 70–99)
POTASSIUM: 4.3 meq/L (ref 3.5–5.1)
SODIUM: 137 meq/L (ref 135–145)
TOTAL PROTEIN: 7.5 g/dL (ref 6.0–8.3)
Total Bilirubin: 0.3 mg/dL (ref 0.2–1.2)

## 2015-12-25 LAB — CBC WITH DIFFERENTIAL/PLATELET
Basophils Absolute: 0 10*3/uL (ref 0.0–0.1)
Basophils Relative: 0.4 % (ref 0.0–3.0)
EOS ABS: 0.1 10*3/uL (ref 0.0–0.7)
EOS PCT: 1.2 % (ref 0.0–5.0)
HCT: 41.1 % (ref 36.0–46.0)
Hemoglobin: 13.5 g/dL (ref 12.0–15.0)
LYMPHS ABS: 3.7 10*3/uL (ref 0.7–4.0)
Lymphocytes Relative: 42.9 % (ref 12.0–46.0)
MCHC: 32.8 g/dL (ref 30.0–36.0)
MCV: 84.9 fl (ref 78.0–100.0)
MONO ABS: 0.7 10*3/uL (ref 0.1–1.0)
Monocytes Relative: 8.1 % (ref 3.0–12.0)
NEUTROS PCT: 47.4 % (ref 43.0–77.0)
Neutro Abs: 4.1 10*3/uL (ref 1.4–7.7)
PLATELETS: 332 10*3/uL (ref 150.0–400.0)
RBC: 4.84 Mil/uL (ref 3.87–5.11)
RDW: 13.6 % (ref 11.5–15.5)
WBC: 8.6 10*3/uL (ref 4.0–10.5)

## 2015-12-25 LAB — TSH: TSH: 0.12 u[IU]/mL — AB (ref 0.35–4.50)

## 2015-12-25 NOTE — Progress Notes (Signed)
BP 118/64 mmHg  Pulse 68  Temp(Src) 98 F (36.7 C) (Oral)  Wt 139 lb (63.05 kg)   CC: foot swelling  Subjective:    Patient ID: Kelsey Yoder, female    DOB: May 21, 1946, 70 y.o.   MRN: [id number]  HPI: Kelsey Yoder is a 70 y.o. female presenting on 12/25/2015 for Foot Swelling   Seen by Rollene Fare 10/2015 with BLE swelling - recommended elevation of legs, low salt diet, lasix 3 pills, and compression stockings. She has not been able to tolerate compression stockings for prolonged period of time. Ongoing swelling predominantly around ankles - worse in evenings, better in am. No significant pain, redness or warmth. Legs get tired easily.   Denies other symptoms such as chest pain/tightness, dyspnea, palpitations, headaches or dizziness.    No new medicines, supplements. Already avoids salt in diet.   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   She does attend exercise class daily. Walking 1hr daily. She is on her feet for long periods of time.   Relevant past medical, surgical, family and social history reviewed and updated as indicated. Interim medical history since our last visit reviewed. Allergies and medications reviewed and updated. Current Outpatient Prescriptions on File Prior to Visit  Medication Sig  . Calcium Carb-Cholecalciferol 600-800 MG-UNIT TABS Take 1 tablet by mouth daily.  . fish oil-omega-3 fatty acids 1000 MG capsule Take 1 g by mouth daily.  . Multiple Vitamin (MULTIVITAMIN) tablet Take 1 tablet by mouth daily.  Marland Kitchen OVER THE COUNTER MEDICATION Take 1 capsule by mouth daily. Tumeric  . traZODone (DESYREL) 50 MG tablet Take 0.5-1 tablets (25-50 mg total) by mouth at bedtime as needed for sleep.  . cyclobenzaprine (FLEXERIL) 10 MG tablet Take 0.5-1 tablets (5-10 mg total) by mouth at bedtime. (Patient not taking: Reported on 12/25/2015)  . gabapentin (NEURONTIN) 600 MG tablet Take 1 tablet (600 mg total) by mouth at bedtime. (Patient not taking:  Reported on 12/25/2015)   No current facility-administered medications on file prior to visit.    Review of Systems Per HPI unless specifically indicated in ROS section     Objective:    BP 118/64 mmHg  Pulse 68  Temp(Src) 98 F (36.7 C) (Oral)  Wt 139 lb (63.05 kg)  Wt Readings from Last 3 Encounters:  12/25/15 139 lb (63.05 kg)  10/16/15 136 lb (61.689 kg)  10/09/15 135 lb (61.236 kg)    Physical Exam  Constitutional: She appears well-developed and well-nourished. No distress.  HENT:  Mouth/Throat: Oropharynx is clear and moist. No oropharyngeal exudate.  Eyes: Conjunctivae and EOM are normal. Pupils are equal, round, and reactive to light.  Neck: Normal range of motion. Neck supple. No JVD present. No thyromegaly present.  Cardiovascular: Normal rate, regular rhythm, normal heart sounds and intact distal pulses.   No murmur heard. Pulmonary/Chest: Effort normal and breath sounds normal. No respiratory distress. She has no wheezes. She has no rales.  Abdominal: Soft. Bowel sounds are normal. There is no hepatomegaly. There is no tenderness.  Musculoskeletal: She exhibits edema (tr pedal edema at ankles).  Skin: Skin is warm and dry. No rash noted.  Psychiatric: She has a normal mood and affect.  Nursing note and vitals reviewed.  Results for orders placed or performed in visit on 05/29/15  Lipid panel  Result Value Ref Range   Cholesterol 179 0 - 200 mg/dL   Triglycerides 108.0 0.0 - 149.0 mg/dL   HDL 43.90 >39.00 mg/dL   VLDL  21.6 0.0 - 40.0 mg/dL   LDL Cholesterol 113 (H) 0 - 99 mg/dL   Total CHOL/HDL Ratio 4    NonHDL 123XX123   Basic metabolic panel  Result Value Ref Range   Sodium 140 135 - 145 mEq/L   Potassium 3.8 3.5 - 5.1 mEq/L   Chloride 106 96 - 112 mEq/L   CO2 27 19 - 32 mEq/L   Glucose, Bld 107 (H) 70 - 99 mg/dL   BUN 17 6 - 23 mg/dL   Creatinine, Ser 0.66 0.40 - 1.20 mg/dL   Calcium 10.1 8.4 - 10.5 mg/dL   GFR 114.83 >60.00 mL/min  VITAMIN D 25  Hydroxy (Vit-D Deficiency, Fractures)  Result Value Ref Range   VITD 51.12 30.00 - 100.00 ng/mL      Assessment & Plan:   Problem List Items Addressed This Visit    Pedal edema - Primary    Mild bilateral dependent pedal edema. No concerning signs on exam. Check labs today for reversible causes.  Anticipate mild CVI - encouraged continue compression stockings, avoiding salt, increased water, elevation of legs. Update if worsening symptoms.       Relevant Orders   Comprehensive metabolic panel   TSH   CBC with Differential/Platelet   Brain natriuretic peptide       Follow up plan: Return if symptoms worsen or fail to improve.  Ria Bush, MD

## 2015-12-25 NOTE — Patient Instructions (Signed)
Exam ok today. Lab work today.  Keep working on elevation of legs, avoiding salt in diet, drinking plenty of water. Possible mild venous insufficiency causing some leg swelling Good to see you today, call us with questions.

## 2015-12-25 NOTE — Progress Notes (Signed)
Pre visit review using our clinic review tool, if applicable. No additional management support is needed unless otherwise documented below in the visit note. 

## 2015-12-25 NOTE — Assessment & Plan Note (Signed)
Mild bilateral dependent pedal edema. No concerning signs on exam. Check labs today for reversible causes.  Anticipate mild CVI - encouraged continue compression stockings, avoiding salt, increased water, elevation of legs. Update if worsening symptoms.

## 2015-12-29 ENCOUNTER — Other Ambulatory Visit: Payer: Self-pay | Admitting: Family Medicine

## 2015-12-29 DIAGNOSIS — R7989 Other specified abnormal findings of blood chemistry: Secondary | ICD-10-CM

## 2016-01-03 ENCOUNTER — Other Ambulatory Visit (INDEPENDENT_AMBULATORY_CARE_PROVIDER_SITE_OTHER): Payer: Federal, State, Local not specified - PPO

## 2016-01-03 DIAGNOSIS — R7989 Other specified abnormal findings of blood chemistry: Secondary | ICD-10-CM

## 2016-01-03 DIAGNOSIS — R946 Abnormal results of thyroid function studies: Secondary | ICD-10-CM

## 2016-01-03 LAB — TSH: TSH: 0.08 u[IU]/mL — AB (ref 0.35–4.50)

## 2016-01-03 LAB — T3: T3, Total: 271 ng/dL — ABNORMAL HIGH (ref 76–181)

## 2016-01-03 LAB — T4, FREE: FREE T4: 3.36 ng/dL — AB (ref 0.60–1.60)

## 2016-01-07 ENCOUNTER — Other Ambulatory Visit: Payer: Self-pay | Admitting: Family Medicine

## 2016-01-07 DIAGNOSIS — E0590 Thyrotoxicosis, unspecified without thyrotoxic crisis or storm: Secondary | ICD-10-CM

## 2016-01-07 DIAGNOSIS — R600 Localized edema: Secondary | ICD-10-CM

## 2016-01-07 HISTORY — DX: Thyrotoxicosis, unspecified without thyrotoxic crisis or storm: E05.90

## 2016-02-04 ENCOUNTER — Ambulatory Visit: Payer: Federal, State, Local not specified - PPO | Admitting: Endocrinology

## 2016-02-14 ENCOUNTER — Encounter: Payer: Self-pay | Admitting: Endocrinology

## 2016-02-14 ENCOUNTER — Ambulatory Visit (INDEPENDENT_AMBULATORY_CARE_PROVIDER_SITE_OTHER): Payer: Federal, State, Local not specified - PPO | Admitting: Endocrinology

## 2016-02-14 VITALS — BP 128/62 | HR 101 | Ht 60.0 in | Wt 138.0 lb

## 2016-02-14 DIAGNOSIS — E0590 Thyrotoxicosis, unspecified without thyrotoxic crisis or storm: Secondary | ICD-10-CM

## 2016-02-14 NOTE — Progress Notes (Signed)
Patient ID: Kelsey Yoder, female   DOB: 1946/02/28, 70 y.o.   MRN: [id number]                                                                                                               Reason for Appointment:  Hyperthyroidism, new consultation  Referring physician: Danise Mina   History of Present Illness:   For the last 2-3 months patient has had symptoms of palpitations with her heart racing at times, she notices this mostly at night She was also being evaluated by her PCP because of her ankles swelling over the last couple of months also. She denies any symptoms of, shakiness, feeling excessively warm and sweaty, nervousness, and low energy.  She does have some difficulty sleeping at night and some tiredness during the day  The patient has not lost any weight and appetite is normal   Wt Readings from Last 3 Encounters:  02/14/16 138 lb (62.6 kg)  12/25/15 139 lb (63 kg)  10/16/15 136 lb (61.7 kg)   When  evaluated with thyroid function tests in 6/17 they showed the following:     Lab Results  Component Value Date   FREET4 3.36 (H) 01/03/2016   TSH 0.08 (L) 01/03/2016   TSH 0.12 (L) 12/25/2015   TSH 1.19 05/24/2012          Medication List       Accurate as of 02/14/16 11:19 AM. Always use your most recent med list.          Calcium Carb-Cholecalciferol 600-800 MG-UNIT Tabs Take 1 tablet by mouth daily.   cyclobenzaprine 10 MG tablet Commonly known as:  FLEXERIL Take 0.5-1 tablets (5-10 mg total) by mouth at bedtime.   fish oil-omega-3 fatty acids 1000 MG capsule Take 1 g by mouth daily.   gabapentin 600 MG tablet Commonly known as:  NEURONTIN Take 1 tablet (600 mg total) by mouth at bedtime.   multivitamin tablet Take 1 tablet by mouth daily.   OVER THE COUNTER MEDICATION Take 1 capsule by mouth daily. Tumeric   traZODone 50 MG tablet Commonly known as:  DESYREL Take 0.5-1 tablets (25-50 mg total) by mouth at bedtime as needed for sleep.             Past Medical History:  Diagnosis Date  . Aortic valve sclerosis    last echo 04/2009  . 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 Cataract   . Osteopenia   . PONV (postoperative nausea and vomiting)    post-op nausea  . Sinus drainage     Past Surgical History:  Procedure Laterality Date  . BREAST BIOPSY Right 2010   benign   . CARPAL TUNNEL RELEASE Right 04/25/2013   CARPAL TUNNEL RELEASE;  Surgeon: Tennis Must, MD;  Location: [place]  . CATARACT EXTRACTION Bilateral 2013  . COLONOSCOPY  2002   rectosigmoid bulge thought 2/2 fibroids  . COLONOSCOPY  2005   polyps, diverticulosis, rpt 7-10 yrs ([facility])  . COLONOSCOPY  08/2015   HP, mild diverticulosis, rpt 5 yrs  ([facility])  . DENTAL SURGERY     dental implants  . ESOPHAGOGASTRODUODENOSCOPY  2005   antral gastropathy, neg H pylori screen ([facility])  . TONSILLECTOMY  1976  . TOTAL ABDOMINAL HYSTERECTOMY W/ BILATERAL SALPINGOOPHORECTOMY  2008   h/o abnormal pap smears    Family History  Problem Relation Age of Onset  . Diabetes Mother     double amputee  . Congestive Heart Failure Mother   . Diabetes Father   . Stroke Sister   . Lupus Sister     X 2  . Diabetes Brother   . Cancer Brother     duodenal, prostate   . Stroke Maternal Grandmother   . Stroke Maternal Grandfather   . Colon cancer Neg Hx   . Colon polyps Neg Hx     Social History:  reports that she has never smoked. She has never used smokeless tobacco. She reports that she does not drink alcohol or use drugs.  Allergies: No Known Allergies  Review of Systems:  Review of Systems  Constitutional: Negative for weight loss and reduced appetite.  HENT: Negative for trouble swallowing.   Eyes: Negative for blurred vision.  Respiratory: Negative for shortness of breath.   Cardiovascular: Positive for palpitations and leg swelling. Negative for chest pain.  Gastrointestinal: Negative for diarrhea.  Endocrine:  Positive for fatigue. Negative for cold intolerance and heat intolerance.  Genitourinary: Negative for frequency.  Musculoskeletal: Negative for muscle cramps.  Skin: Positive for dry skin. Negative for rash.  Neurological: Negative for weakness and tremors.  Psychiatric/Behavioral: Positive for insomnia.Negative for nervousness.      Examination:   BP 128/62   Pulse (!) 101   Ht 5' (1.524 m)   Wt 138 lb (62.6 kg)   SpO2 93%   BMI 26.95 kg/m    General Appearance:  well-built and nourished, pleasant, not anxious or hyperkinetic.        Eyes: No unusual prominence, lid lag or stare. No swelling of the eyelids  Fundi appear normal Neck: The thyroid is enlarged About 2 times normal, slightly irregular and firm on the right side and only minimally palpable on the left side  There is no lymphadenopathy .          Heart: normal S1 and S2, showed ejection systolic murmur on the base   present  Lungs: breath sounds are clear bilaterally Abdomen: no hepatosplenomegaly or other palpable abnormality  Extremities: hands are warm.  She has 1+ ankle edema but no swelling of the lower legs or feet.  Neurological: Deep tendon reflexes at biceps are slightly brisk. Bilateral fine tremors are minimally present. Skin: No rash, minimal thickening of the skin present on the lower legs but not on the shins   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 Graves' disease   She is symptomatic with palpitations and has relatively fast heart rate and mild hyperreflexia on exam She does have a goiter mostly on the right side Since she has an asymmetrical goiter she may have a toxic nodular goiter as a possible option also  Discussed with the patient that if she has Graves' disease this is autoimmune in nature To confirm this we'll need to do thyroid thyrotropin receptor antibody If this is negative she will need to have a thyroid nuclear scan done to evaluate the thyroid function  Treatment  will be different since with Graves' disease she may be able to get into remission  with methimazole since her diseases mild.  Otherwise if she has a hot nodule she will need I-131 treatment  Patient handout on hyperthyroidism and radioactive iodine treatment given Patient understands the above discussion and treatment options. All questions were answered satisfactorily   Kasey Hansell 02/14/2016, 11:19 AM

## 2016-02-15 ENCOUNTER — Telehealth: Payer: Self-pay | Admitting: Endocrinology

## 2016-02-15 MED ORDER — LEVOTHYROXINE SODIUM 150 MCG PO TABS: 150.0000 ug | ORAL_TABLET | Freq: Every day | ORAL | 2 refills | Status: DC

## 2016-02-15 NOTE — Telephone Encounter (Signed)
This was sent to [facility] since that was listed.  If she wants we can send the 150 g levothyroxine to [facility] also

## 2016-02-15 NOTE — Telephone Encounter (Signed)
I contacted the pt. She stated she would like to for the med to be sent to the [facility] in [place]. Rx submitted per pt's request.

## 2016-02-15 NOTE — Telephone Encounter (Signed)
See note below and please advise. Thanks! 

## 2016-02-15 NOTE — Telephone Encounter (Signed)
PT stated that she thought Dr. Dwyane Dee was going to send in a prescription for her thyroid, she wanted to verify this information.  Requests call back.  [place]

## 2016-03-18 ENCOUNTER — Other Ambulatory Visit (INDEPENDENT_AMBULATORY_CARE_PROVIDER_SITE_OTHER): Payer: Federal, State, Local not specified - PPO

## 2016-03-18 ENCOUNTER — Ambulatory Visit (INDEPENDENT_AMBULATORY_CARE_PROVIDER_SITE_OTHER): Payer: Federal, State, Local not specified - PPO | Admitting: Endocrinology

## 2016-03-18 VITALS — BP 136/62 | HR 105 | Temp 97.8°F | Resp 14 | Ht 60.0 in | Wt 137.8 lb

## 2016-03-18 DIAGNOSIS — E0590 Thyrotoxicosis, unspecified without thyrotoxic crisis or storm: Secondary | ICD-10-CM

## 2016-03-18 LAB — T4, FREE: FREE T4: 4.11 ng/dL — AB (ref 0.60–1.60)

## 2016-03-18 LAB — TSH: TSH: 0.03 u[IU]/mL — AB (ref 0.35–4.50)

## 2016-03-18 LAB — T3, FREE: T3, Free: 8.6 pg/mL — ABNORMAL HIGH (ref 2.3–4.2)

## 2016-03-18 MED ORDER — METOPROLOL SUCCINATE ER 50 MG PO TB24: 50.0000 mg | ORAL_TABLET | Freq: Every day | ORAL | 0 refills | Status: DC

## 2016-03-18 NOTE — Addendum Note (Signed)
Addended by: Marchia Bond on: 03/18/2016 01:39 PM   Modules accepted: Orders

## 2016-03-18 NOTE — Progress Notes (Signed)
Patient ID: Kelsey Yoder, female   DOB: 07-03-1946, 70 y.o.   MRN: [id number]                                                                                                               Reason for Appointment:  Hyperthyroidism, follow-up  Referring physician: Danise Yoder   History of Present Illness:   Prior to the consultation she had a history for the prior 2-3 months  of palpitations with her heart racing at times especially at night She was also having ankle swelling but no other typical symptoms of hyperthyroidism including weight loss.  She had a small goiter on exam She was supposed to get labs done for thyrotropin receptor antibody on her last visit but she did not go until this morning  However on her own she thinks that her symptoms of palpitations are better She has only occasional fatigue No significant weight change   Wt Readings from Last 3 Encounters:  03/18/16 137 lb 12.8 oz (62.5 kg)  02/14/16 138 lb (62.6 kg)  12/25/15 139 lb (63 kg)     Lab Results  Component Value Date   FREET4 3.36 (H) 01/03/2016   TSH 0.08 (L) 01/03/2016   TSH 0.12 (L) 12/25/2015   TSH 1.19 05/24/2012          Medication List       Accurate as of 03/18/16  1:14 PM. Always use your most recent med list.          Calcium Carb-Cholecalciferol 600-800 MG-UNIT Tabs Take 1 tablet by mouth daily.   cyclobenzaprine 10 MG tablet Commonly known as:  Kelsey Yoder Take 0.5-1 tablets (5-10 mg total) by mouth at bedtime.   fish oil-omega-3 fatty acids 1000 MG capsule Take 1 g by mouth daily.   gabapentin 600 MG tablet Commonly known as:  NEURONTIN Take 1 tablet (600 mg total) by mouth at bedtime.   levothyroxine 150 MCG tablet Commonly known as:  Kelsey Yoder Take 1 tablet (150 mcg total) by mouth daily before breakfast.   multivitamin tablet Take 1 tablet by mouth daily.   OVER THE COUNTER MEDICATION Take 1 capsule by mouth daily. Tumeric   traZODone 50 MG  tablet Commonly known as:  Kelsey Yoder Take 0.5-1 tablets (25-50 mg total) by mouth at bedtime as needed for sleep.           Past Medical History:  Diagnosis Date  . Aortic valve sclerosis    last echo 04/2009  . 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 Cataract   . Osteopenia   . PONV (postoperative nausea and vomiting)    post-op nausea  . Sinus drainage     Past Surgical History:  Procedure Laterality Date  . BREAST BIOPSY Right 2010   benign   . CARPAL TUNNEL RELEASE Right 04/25/2013   CARPAL TUNNEL RELEASE;  Surgeon: Kelsey Must, MD;  [facility]: [facility]  . CATARACT EXTRACTION Bilateral 2013  . COLONOSCOPY  2002   rectosigmoid bulge thought  2/2 fibroids  . COLONOSCOPY  2005   polyps, diverticulosis, rpt 7-10 yrs ([facility]elsey Yoder)  . COLONOSCOPY  08/2015   HP, mild diverticulosis, rpt 5 yrs  (Kelsey Yoder)  . DENTAL SURGERY     dental implants  . ESOPHAGOGASTRODUODENOSCOPY  2005   antral gastropathy, neg H pylori screen (Kelsey Yoder)  . TONSILLECTOMY  1976  . TOTAL ABDOMINAL HYSTERECTOMY W/ BILATERAL SALPINGOOPHORECTOMY  2008   h/o abnormal pap smears    Family History  Problem Relation Age of Onset  . Diabetes Mother     double amputee  . Congestive Heart Failure Mother   . Diabetes Father   . Stroke Sister   . Lupus Sister     X 2  . Diabetes Brother   . Cancer Brother     duodenal, prostate   . Stroke Maternal Grandmother   . Stroke Maternal Grandfather   . Colon cancer Neg Hx   . Colon polyps Neg Hx   . Thyroid disease Neg Hx     Social History:  reports that she has never smoked. She has never used smokeless tobacco. She reports that she does not drink alcohol or use drugs.  Allergies: No Known Allergies  Review of Systems:  Review of Systems    Examination:   BP 136/62   Pulse (!) 105   Temp 97.8 F (36.6 C)   Resp 14   Ht 5' (1.524 m)   Wt 137 lb 12.8 oz (62.5 kg)   SpO2 98%   BMI 26.91 kg/m   Repeat pulse 96,  regular She is not unusually anxious Neck: The thyroid is enlarged Nearly 2 times normal, slightly irregular and firm on the right side and smaller on the left   Extremities: hands are warm but not moist No edema present . Neurological: Deep tendon reflexes at biceps are slightly brisk. No fine tremors are  present.    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 Graves' disease   Although she is not symptomatic currently with palpitations and her edema is better she still has objective signs of mild hyperthyroidism Still has a goiter  She was supposed to have thyrotropin receptor antibody check but she did not go for this until today  Discussed with patient that if her antibody is negative will proceed with thyroid scan and also recheck her thyroid functions today Currently since she has a fast heart rate will get started on Toprol 50 mg daily  Follow-up in 1 month   Kelsey Yoder 03/18/2016, 1:14 PM   Addendum: Apparently patient has been given prescription for levothyroxine in August by error, she has run out of this

## 2016-03-19 LAB — THYROTROPIN RECEPTOR AUTOABS: THYROTROPIN RECEPTOR AB: 13.86 IU/L — AB (ref 0.00–1.75)

## 2016-03-20 ENCOUNTER — Ambulatory Visit: Payer: Federal, State, Local not specified - PPO | Admitting: Endocrinology

## 2016-04-09 ENCOUNTER — Telehealth: Payer: Self-pay | Admitting: Endocrinology

## 2016-04-09 NOTE — Telephone Encounter (Signed)
LM for pt to schedule 3 wk fu with labs per Dr. Dwyane Dee

## 2016-04-18 ENCOUNTER — Encounter: Payer: Self-pay | Admitting: Endocrinology

## 2016-04-18 ENCOUNTER — Ambulatory Visit (INDEPENDENT_AMBULATORY_CARE_PROVIDER_SITE_OTHER): Payer: Federal, State, Local not specified - PPO | Admitting: Endocrinology

## 2016-04-18 ENCOUNTER — Other Ambulatory Visit: Payer: Self-pay | Admitting: *Deleted

## 2016-04-18 VITALS — BP 123/68 | HR 82 | Ht 59.5 in | Wt 138.0 lb

## 2016-04-18 DIAGNOSIS — E0590 Thyrotoxicosis, unspecified without thyrotoxic crisis or storm: Secondary | ICD-10-CM

## 2016-04-18 LAB — T4, FREE: Free T4: 3.54 ng/dL — ABNORMAL HIGH (ref 0.60–1.60)

## 2016-04-18 MED ORDER — METHIMAZOLE 10 MG PO TABS: 10.0000 mg | ORAL_TABLET | Freq: Every day | ORAL | 2 refills | Status: DC

## 2016-04-18 MED ORDER — METOPROLOL SUCCINATE ER 50 MG PO TB24: 50.0000 mg | ORAL_TABLET | Freq: Every day | ORAL | 0 refills | Status: DC

## 2016-04-18 NOTE — Progress Notes (Signed)
Please let patient know that the thyroid is still high, start with methimazole 10 mg daily, stop metoprolol in 1 week

## 2016-04-18 NOTE — Progress Notes (Signed)
Patient ID: Kelsey Yoder, female   DOB: 11-16-1945, 70 y.o.   MRN: [id number]                                                                                                               Reason for Appointment:  Hyperthyroidism, follow-up  Referring physician: Danise Mina   History of Present Illness:   Prior to the consultation she had a history for the prior 2-3 months  of palpitations with her heart racing at times especially at night She was also having ankle swelling but no other typical symptoms of hyperthyroidism including weight loss.  She had a small goiter on exam Her labs showed that she had a positive result for thyrotropin receptor antibody on her last visit  She was supposed to start methimazole in September but apparently the pharmacy did not receive the prescription She is only taking metoprolol  However on her own she thinks that her symptoms of palpitations are controlled, may feel hot at times but not shaky No significant weight change or fatigue    Wt Readings from Last 3 Encounters:  04/18/16 138 lb (62.6 kg)  03/18/16 137 lb 12.8 oz (62.5 kg)  02/14/16 138 lb (62.6 kg)     Lab Results  Component Value Date   FREET4 4.11 (H) 03/18/2016   FREET4 3.36 (H) 01/03/2016   T3FREE 8.6 (H) 03/18/2016   TSH 0.03 (L) 03/18/2016   TSH 0.08 (L) 01/03/2016   TSH 0.12 (L) 12/25/2015          Medication List       Accurate as of 04/18/16  9:04 AM. Always use your most recent med list.          Calcium Carb-Cholecalciferol 600-800 MG-UNIT Tabs Take 1 tablet by mouth daily.   cyclobenzaprine 10 MG tablet Commonly known as:  FLEXERIL Take 0.5-1 tablets (5-10 mg total) by mouth at bedtime.   fish oil-omega-3 fatty acids 1000 MG capsule Take 1 g by mouth daily.   gabapentin 600 MG tablet Commonly known as:  NEURONTIN Take 1 tablet (600 mg total) by mouth at bedtime.   metoprolol succinate 50 MG 24 hr tablet Commonly known as:  TOPROL-XL Take 1  tablet (50 mg total) by mouth daily. Take with or immediately following a meal.   multivitamin tablet Take 1 tablet by mouth daily.   OVER THE COUNTER MEDICATION Take 1 capsule by mouth daily. Tumeric   traZODone 50 MG tablet Commonly known as:  DESYREL Take 0.5-1 tablets (25-50 mg total) by mouth at bedtime as needed for sleep.           Past Medical History:  Diagnosis Date  . Aortic valve sclerosis    last echo 04/2009  . 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 Cataract   . Osteopenia   . PONV (postoperative nausea and vomiting)    post-op nausea  . Sinus drainage     Past Surgical History:  Procedure Laterality Date  . BREAST BIOPSY Right 2010  benign   . CARPAL TUNNEL RELEASE Right 04/25/2013   CARPAL TUNNEL RELEASE;  Surgeon: Tennis Must, MD;  Location: [place]  . CATARACT EXTRACTION Bilateral 2013  . COLONOSCOPY  2002   rectosigmoid bulge thought 2/2 fibroids  . COLONOSCOPY  2005   polyps, diverticulosis, rpt 7-10 yrs ([facility])  . COLONOSCOPY  08/2015   HP, mild diverticulosis, rpt 5 yrs  ([facility])  . DENTAL SURGERY     dental implants  . ESOPHAGOGASTRODUODENOSCOPY  2005   antral gastropathy, neg H pylori screen ([facility])  . TONSILLECTOMY  1976  . TOTAL ABDOMINAL HYSTERECTOMY W/ BILATERAL SALPINGOOPHORECTOMY  2008   h/o abnormal pap smears    Family History  Problem Relation Age of Onset  . Diabetes Mother     double amputee  . Congestive Heart Failure Mother   . Diabetes Father   . Stroke Sister   . Lupus Sister     X 2  . Diabetes Brother   . Cancer Brother     duodenal, prostate   . Stroke Maternal Grandmother   . Stroke Maternal Grandfather   . Colon cancer Neg Hx   . Colon polyps Neg Hx   . Thyroid disease Neg Hx     Social History:  reports that she has never smoked. She has never used smokeless tobacco. She reports that she does not drink alcohol or use drugs.  Allergies: No Known Allergies  Review of  Systems:  Review of Systems  Psychiatric/Behavioral: Positive for insomnia.      Examination:   BP 123/68   Pulse 82   Ht 4' 11.5" (1.511 m)   Wt 138 lb (62.6 kg)   BMI 27.41 kg/m    She is not  anxious Neck: The thyroid is enlarged About 1-1/2 times normal, slightly firm, more on the right side  Nearly 2 times normal, slightly irregular and firm on the right side and smaller on the left She is not diaphoretic. Biceps reflexes appear normal    Assessment/Plan:   Hyperthyroidism,  from Graves' disease   Although she is not symptomatic currently with palpitations and her heart rate is controlled with metoprolol she probably still is hyperthyroid Goiter is slightly smaller Will recheck her thyroid levels today and decide on dosage of methimazole if her free T4 is still high Follow-up in 1 month   Tziporah Knoke 04/18/2016, 9:04 AM

## 2016-04-19 ENCOUNTER — Encounter: Payer: Self-pay | Admitting: Family Medicine

## 2016-05-06 ENCOUNTER — Ambulatory Visit: Payer: Federal, State, Local not specified - PPO | Admitting: Endocrinology

## 2016-05-16 ENCOUNTER — Ambulatory Visit (INDEPENDENT_AMBULATORY_CARE_PROVIDER_SITE_OTHER): Payer: Federal, State, Local not specified - PPO | Admitting: Endocrinology

## 2016-05-16 ENCOUNTER — Encounter: Payer: Self-pay | Admitting: Endocrinology

## 2016-05-16 VITALS — BP 122/68 | HR 96 | Ht 60.0 in | Wt 137.0 lb

## 2016-05-16 DIAGNOSIS — E0590 Thyrotoxicosis, unspecified without thyrotoxic crisis or storm: Secondary | ICD-10-CM

## 2016-05-16 LAB — CBC
HEMATOCRIT: 40 % (ref 36.0–46.0)
HEMOGLOBIN: 13.3 g/dL (ref 12.0–15.0)
MCHC: 33.3 g/dL (ref 30.0–36.0)
MCV: 84.3 fl (ref 78.0–100.0)
PLATELETS: 343 10*3/uL (ref 150.0–400.0)
RBC: 4.74 Mil/uL (ref 3.87–5.11)
RDW: 13.9 % (ref 11.5–15.5)
WBC: 8.1 10*3/uL (ref 4.0–10.5)

## 2016-05-16 LAB — T4, FREE: Free T4: 2.45 ng/dL — ABNORMAL HIGH (ref 0.60–1.60)

## 2016-05-16 LAB — T3, FREE: T3, Free: 6.5 pg/mL — ABNORMAL HIGH (ref 2.3–4.2)

## 2016-05-16 NOTE — Progress Notes (Signed)
Patient ID: Kelsey Yoder, female   DOB: 1946-02-26, 70 y.o.   MRN: [id number]                                                                                                               Reason for Appointment:  Hyperthyroidism, follow-up  Referring physician: Danise Mina   History of Present Illness:   Prior to the consultation she had a history for the prior 2-3 months  of palpitations with her heart racing at times especially at night She was also having ankle swelling but no other typical symptoms of hyperthyroidism including weight loss.  She had a small goiter on exam Her labs showed that she had a positive result for thyrotropin receptor antibody   She is only taking methimazole, currently 10 mg a day She has had only infrequent short lasting palpitations now, does not feel as hard as before  No significant weight change or fatigue    Wt Readings from Last 3 Encounters:  05/16/16 137 lb (62.1 kg)  04/18/16 138 lb (62.6 kg)  03/18/16 137 lb 12.8 oz (62.5 kg)   Thyroid levels from today are pending   Lab Results  Component Value Date   FREET4 3.54 (H) 04/18/2016   FREET4 4.11 (H) 03/18/2016   FREET4 3.36 (H) 01/03/2016   T3FREE 8.6 (H) 03/18/2016   TSH 0.03 (L) 03/18/2016   TSH 0.08 (L) 01/03/2016   TSH 0.12 (L) 12/25/2015          Medication List       Accurate as of 05/16/16  3:21 PM. Always use your most recent med list.          Calcium Carb-Cholecalciferol 600-800 MG-UNIT Tabs Take 1 tablet by mouth daily.   cyclobenzaprine 10 MG tablet Commonly known as:  FLEXERIL Take 0.5-1 tablets (5-10 mg total) by mouth at bedtime.   fish oil-omega-3 fatty acids 1000 MG capsule Take 1 g by mouth daily.   gabapentin 600 MG tablet Commonly known as:  NEURONTIN Take 1 tablet (600 mg total) by mouth at bedtime.   methimazole 10 MG tablet Commonly known as:  TAPAZOLE Take 1 tablet (10 mg total) by mouth daily.   metoprolol succinate 50 MG 24 hr  tablet Commonly known as:  TOPROL-XL Take 1 tablet (50 mg total) by mouth daily. Take with or immediately following a meal.   multivitamin tablet Take 1 tablet by mouth daily.   OVER THE COUNTER MEDICATION Take 1 capsule by mouth daily. Tumeric   traZODone 50 MG tablet Commonly known as:  DESYREL Take 0.5-1 tablets (25-50 mg total) by mouth at bedtime as needed for sleep.           Past Medical History:  Diagnosis Date  . Aortic valve sclerosis    last echo 04/2009  . 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 Cataract   . Hyperthyroidism 01/07/2016   Dr Dwyane Dee, positive TSHR Ab  . Osteopenia   . PONV (postoperative nausea and vomiting)  post-op nausea  . Sinus drainage     Past Surgical History:  Procedure Laterality Date  . BREAST BIOPSY Right 2010   benign   . CARPAL TUNNEL RELEASE Right 04/25/2013   CARPAL TUNNEL RELEASE;  Surgeon: Tennis Must, MD;  Location: [facility]  . CATARACT EXTRACTION Bilateral 2013  . COLONOSCOPY  2002   rectosigmoid bulge thought 2/2 fibroids  . COLONOSCOPY  2005   polyps, diverticulosis, rpt 7-10 yrs ([facility])  . COLONOSCOPY  08/2015   HP, mild diverticulosis, rpt 5 yrs  ([facility])  . DENTAL SURGERY     dental implants  . ESOPHAGOGASTRODUODENOSCOPY  2005   antral gastropathy, neg H pylori screen ([facility])  . TONSILLECTOMY  1976  . TOTAL ABDOMINAL HYSTERECTOMY W/ BILATERAL SALPINGOOPHORECTOMY  2008   h/o abnormal pap smears    Family History  Problem Relation Age of Onset  . Diabetes Mother     double amputee  . Congestive Heart Failure Mother   . Diabetes Father   . Stroke Sister   . Lupus Sister     X 2  . Diabetes Brother   . Cancer Brother     duodenal, prostate   . Stroke Maternal Grandmother   . Stroke Maternal Grandfather   . Colon cancer Neg Hx   . Colon polyps Neg Hx   . Thyroid disease Neg Hx     Social History:  reports that she has never smoked. She has never used smokeless  tobacco. She reports that she does not drink alcohol or use drugs.  Allergies: No Known Allergies    Review of Systems  She has had problems with insomnia, takes trazodone as needed with relief    Examination:   BP 122/68   Pulse 96   Ht 5' (1.524 m)   Wt 137 lb (62.1 kg)   SpO2 97%   BMI 26.76 kg/m   Repeat pulse 92 She is not  anxious Neck: The thyroid is enlarged Nearly 2 times normal, slightly irregular and firm on the right side.  Also has a larger nodular area on the right upper pole relatively higher in the neck but moving with swallowing  Has mild enlargement on the left also, firm and about 1-1/2 times normal She is not having any tremors, skin is not unusually warm or diaphoretic. Biceps reflexes appear normal    Assessment/Plan:   Hyperthyroidism,  from Graves' disease   She has been on 10 mg of methimazole now and symptomatically doing better Pulse rate is still relatively rapid today Although her overall goiter is smaller she still has a significant nodular enlargement of the right upper pole  Will recheck her thyroid levels today and decide on dosage of methimazole  Also will consider a thyroid scan to evaluate right lobe, may need to rule out a cold nodule on the right upper pole Follow-up in 1 month   Lavere Shinsky 05/16/2016, 3:21 PM    Addendum: Thyroid levels are still increased although improving, will increase methimazole to 10 mg twice a day Will wait on thyroid scan until she is euthyroid  Office Visit on 05/16/2016  Component Date Value Ref Range Status  . Free T4 05/16/2016 2.45* 0.60 - 1.60 ng/dL Final   Comment: Specimens from patients who are undergoing biotin therapy and /or ingesting biotin supplements may contain high levels of biotin.  The higher biotin concentration in these specimens interferes with this Free T4 assay.  Specimens that contain high levels  of  biotin may cause false high results for this Free T4 assay.  Please interpret  results in light of the total clinical presentation of the patient.    . T3, Free 05/16/2016 6.5* 2.3 - 4.2 pg/mL Final  . WBC 05/16/2016 8.1  4.0 - 10.5 K/uL Final  . RBC 05/16/2016 4.74  3.87 - 5.11 Mil/uL Final  . Platelets 05/16/2016 343.0  150.0 - 400.0 K/uL Final  . Hemoglobin 05/16/2016 13.3  12.0 - 15.0 g/dL Final  . HCT 05/16/2016 40.0  36.0 - 46.0 % Final  . MCV 05/16/2016 84.3  78.0 - 100.0 fl Final  . MCHC 05/16/2016 33.3  30.0 - 36.0 g/dL Final  . RDW 05/16/2016 13.9  11.5 - 15.5 % Final

## 2016-05-17 NOTE — Progress Notes (Signed)
Please let patient know that the level is still high, increase methimazole to twice daily, may need to send a new  prescription

## 2016-05-19 ENCOUNTER — Other Ambulatory Visit: Payer: Self-pay

## 2016-05-19 MED ORDER — METHIMAZOLE 10 MG PO TABS: 10.0000 mg | ORAL_TABLET | Freq: Two times a day (BID) | ORAL | 2 refills | Status: DC

## 2016-05-31 ENCOUNTER — Other Ambulatory Visit: Payer: Self-pay | Admitting: Family Medicine

## 2016-05-31 DIAGNOSIS — E559 Vitamin D deficiency, unspecified: Secondary | ICD-10-CM

## 2016-05-31 DIAGNOSIS — Z1159 Encounter for screening for other viral diseases: Secondary | ICD-10-CM

## 2016-05-31 DIAGNOSIS — E785 Hyperlipidemia, unspecified: Secondary | ICD-10-CM

## 2016-06-03 ENCOUNTER — Other Ambulatory Visit (INDEPENDENT_AMBULATORY_CARE_PROVIDER_SITE_OTHER): Payer: Federal, State, Local not specified - PPO

## 2016-06-03 DIAGNOSIS — E559 Vitamin D deficiency, unspecified: Secondary | ICD-10-CM

## 2016-06-03 DIAGNOSIS — E785 Hyperlipidemia, unspecified: Secondary | ICD-10-CM | POA: Diagnosis not present

## 2016-06-03 DIAGNOSIS — Z1159 Encounter for screening for other viral diseases: Secondary | ICD-10-CM

## 2016-06-03 LAB — BASIC METABOLIC PANEL
BUN: 13 mg/dL (ref 6–23)
CALCIUM: 9.8 mg/dL (ref 8.4–10.5)
CO2: 28 meq/L (ref 19–32)
Chloride: 106 mEq/L (ref 96–112)
Creatinine, Ser: 0.71 mg/dL (ref 0.40–1.20)
GFR: 105.23 mL/min (ref >60.00)
GLUCOSE: 125 mg/dL — AB (ref 70–99)
Potassium: 3.9 mEq/L (ref 3.5–5.1)
SODIUM: 141 meq/L (ref 135–145)

## 2016-06-03 LAB — VITAMIN D 25 HYDROXY (VIT D DEFICIENCY, FRACTURES): VITD: 41.45 ng/mL (ref 30.00–100.00)

## 2016-06-03 LAB — LIPID PANEL
CHOLESTEROL: 176 mg/dL (ref 0–200)
HDL: 50.8 mg/dL (ref >39.00)
LDL CALC: 108 mg/dL — AB (ref 0–99)
NonHDL: 125.6
TRIGLYCERIDES: 90 mg/dL (ref 0.0–149.0)
Total CHOL/HDL Ratio: 3
VLDL: 18 mg/dL (ref 0.0–40.0)

## 2016-06-04 LAB — HEPATITIS C ANTIBODY: HCV Ab: NEGATIVE

## 2016-06-10 ENCOUNTER — Encounter: Payer: Self-pay | Admitting: Family Medicine

## 2016-06-10 ENCOUNTER — Ambulatory Visit (INDEPENDENT_AMBULATORY_CARE_PROVIDER_SITE_OTHER): Payer: Federal, State, Local not specified - PPO | Admitting: Family Medicine

## 2016-06-10 VITALS — BP 132/80 | HR 75 | Temp 98.2°F | Resp 16 | Ht 60.5 in | Wt 138.0 lb

## 2016-06-10 DIAGNOSIS — G4700 Insomnia, unspecified: Secondary | ICD-10-CM

## 2016-06-10 DIAGNOSIS — Z23 Encounter for immunization: Secondary | ICD-10-CM

## 2016-06-10 DIAGNOSIS — R739 Hyperglycemia, unspecified: Secondary | ICD-10-CM

## 2016-06-10 DIAGNOSIS — M8580 Other specified disorders of bone density and structure, unspecified site: Secondary | ICD-10-CM

## 2016-06-10 DIAGNOSIS — R0989 Other specified symptoms and signs involving the circulatory and respiratory systems: Secondary | ICD-10-CM

## 2016-06-10 DIAGNOSIS — Z0000 Encounter for general adult medical examination without abnormal findings: Secondary | ICD-10-CM

## 2016-06-10 DIAGNOSIS — Z7189 Other specified counseling: Secondary | ICD-10-CM

## 2016-06-10 DIAGNOSIS — E559 Vitamin D deficiency, unspecified: Secondary | ICD-10-CM

## 2016-06-10 DIAGNOSIS — E0590 Thyrotoxicosis, unspecified without thyrotoxic crisis or storm: Secondary | ICD-10-CM

## 2016-06-10 DIAGNOSIS — E785 Hyperlipidemia, unspecified: Secondary | ICD-10-CM

## 2016-06-10 NOTE — Assessment & Plan Note (Signed)
Preventative protocols reviewed and updated unless pt declined. Discussed healthy diet and lifestyle.  

## 2016-06-10 NOTE — Assessment & Plan Note (Addendum)
Not appreciated today. [place] from 06/2015 WNL

## 2016-06-10 NOTE — Assessment & Plan Note (Signed)
New - discussed diet changes to maintain glycemic control. Check A1c next labs. +fmhx DM

## 2016-06-10 NOTE — Assessment & Plan Note (Addendum)
Advanced directive: living will in chart 06/2014 - no prolonged life support if terminal condition. Would want HCPOA to be sister Kelsey Yoder. HCPOA form provided today.

## 2016-06-10 NOTE — Progress Notes (Signed)
Pre visit review using our clinic review tool, if applicable. No additional management support is needed unless otherwise documented below in the visit note. 

## 2016-06-10 NOTE — Progress Notes (Signed)
BP 132/80 (BP Location: Right Arm, Patient Position: Sitting, Cuff Size: Normal)   Pulse 75   Temp 98.2 F (36.8 C) (Oral)   Resp 16   Ht 5' 0.5" (1.537 m)   Wt 138 lb (62.6 kg)   SpO2 98%   BMI 26.51 kg/m    CC: CPE Subjective:    Patient ID: Kelsey Yoder, female    DOB: 04/06/1946, 70 y.o.   MRN: [id number]  HPI: Kelsey Yoder is a 70 y.o. female presenting on 06/10/2016 for Annual Exam (CPE)   Recent dx graves disease with hyperthyroidism - sees Dr Dwyane Dee, on methimazole 10mg  twice daily.   Preventative: COLONOSCOPY 08/2015; HP, mild diverticulosis, rpt 5 yrs ([facility]) Well woman - OBGYN 09/2015 (Dr. Harolyn Rutherford).S/p hysterectomy 2008 for h/o abnormal paps. Sees yearly.  Mammogram benign 10/2015  DEXA - 2014 - osteopenia, T -1.5 at hip. Regular with calcium and vit D  Flu shot - today Tetanus shot - done 2010. Pneumovax 2014. prevnar 05/2014 zostavax done 2012 Advanced directive: living will in chart 06/2014 - no prolonged life support if terminal condition. Would want HCPOA to be sister [name]. HCPOA form provided today.  Seat belt use discussed Sunscreen use discussed. No changing moles on skin Non smokers Alcohol - none  Caffeine: occasional Lives alone, no pets Occupation: retired 2012, was in [employer] forces operational officer Edu: master's Activity: goes to gym 3-4x/wk (water aerobics and weight lifting) Diet: some water, fruits and vegetables daily   Relevant past medical, surgical, family and social history reviewed and updated as indicated. Interim medical history since our last visit reviewed. Allergies and medications reviewed and updated. Current Outpatient Prescriptions on File Prior to Visit  Medication Sig  . Calcium Carb-Cholecalciferol 600-800 MG-UNIT TABS Take 1 tablet by mouth daily.  . fish oil-omega-3 fatty acids 1000 MG capsule Take 1 g by mouth daily.  . methimazole (TAPAZOLE) 10 MG tablet Take 1 tablet (10 mg total) by mouth 2 (two) times  daily. Take 2 times daily  . Multiple Vitamin (MULTIVITAMIN) tablet Take 1 tablet by mouth daily.  Marland Kitchen OVER THE COUNTER MEDICATION Take 1 capsule by mouth daily. Tumeric  . traZODone (DESYREL) 50 MG tablet Take 0.5-1 tablets (25-50 mg total) by mouth at bedtime as needed for sleep.   No current facility-administered medications on file prior to visit.     Review of Systems  Constitutional: Negative for activity change, appetite change, chills, fatigue, fever and unexpected weight change.  HENT: Negative for hearing loss.   Eyes: Negative for visual disturbance.  Respiratory: Negative for cough, chest tightness, shortness of breath and wheezing.   Cardiovascular: Positive for leg swelling (mild). Negative for chest pain and palpitations.  Gastrointestinal: Negative for abdominal distention, abdominal pain, blood in stool, constipation, diarrhea, nausea and vomiting.  Genitourinary: Negative for difficulty urinating and hematuria.  Musculoskeletal: Negative for arthralgias, myalgias and neck pain.  Skin: Negative for rash.  Neurological: Negative for dizziness, seizures, syncope and headaches.  Hematological: Negative for adenopathy. Does not bruise/bleed easily.  Psychiatric/Behavioral: Negative for dysphoric mood. The patient is not nervous/anxious.    Per HPI unless specifically indicated in ROS section     Objective:    BP 132/80 (BP Location: Right Arm, Patient Position: Sitting, Cuff Size: Normal)   Pulse 75   Temp 98.2 F (36.8 C) (Oral)   Resp 16   Ht 5' 0.5" (1.537 m)   Wt 138 lb (62.6 kg)   SpO2 98%   BMI 26.51  kg/m   Wt Readings from Last 3 Encounters:  06/10/16 138 lb (62.6 kg)  05/16/16 137 lb (62.1 kg)  04/18/16 138 lb (62.6 kg)    Physical Exam  Constitutional: She is oriented to person, place, and time. She appears well-developed and well-nourished. No distress.  HENT:  Head: Normocephalic and atraumatic.  Right Ear: Hearing, tympanic membrane, external ear  and ear canal normal.  Left Ear: Hearing, tympanic membrane, external ear and ear canal normal.  Nose: Nose normal.  Mouth/Throat: Uvula is midline, oropharynx is clear and moist and mucous membranes are normal. No oropharyngeal exudate, posterior oropharyngeal edema or posterior oropharyngeal erythema.  Eyes: Conjunctivae and EOM are normal. Pupils are equal, round, and reactive to light. No scleral icterus.  Neck: Normal range of motion. Neck supple.  Cardiovascular: Normal rate, regular rhythm, normal heart sounds and intact distal pulses.   No murmur heard. Pulses:      Radial pulses are 2+ on the right side, and 2+ on the left side.  Pulmonary/Chest: Effort normal and breath sounds normal. No respiratory distress. She has no wheezes. She has no rales.  Abdominal: Soft. Bowel sounds are normal. She exhibits no distension and no mass. There is no tenderness. There is no rebound and no guarding.  Musculoskeletal: Normal range of motion. She exhibits no edema.  Lymphadenopathy:    She has no cervical adenopathy.  Neurological: She is alert and oriented to person, place, and time.  CN grossly intact, station and gait intact  Skin: Skin is warm and dry. No rash noted.  Psychiatric: She has a normal mood and affect. Her behavior is normal. Judgment and thought content normal.  Nursing note and vitals reviewed.  Results for orders placed or performed in visit on [id number]  Basic metabolic panel  Result Value Ref Range   Sodium 141 135 - 145 mEq/L   Potassium 3.9 3.5 - 5.1 mEq/L   Chloride 106 96 - 112 mEq/L   CO2 28 19 - 32 mEq/L   Glucose, Bld 125 (H) 70 - 99 mg/dL   BUN 13 6 - 23 mg/dL   Creatinine, Ser 0.71 0.40 - 1.20 mg/dL   Calcium 9.8 8.4 - 10.5 mg/dL   GFR 105.23 >60.00 mL/min  VITAMIN D 25 Hydroxy (Vit-D Deficiency, Fractures)  Result Value Ref Range   VITD 41.45 30.00 - 100.00 ng/mL  Hepatitis C antibody  Result Value Ref Range   HCV Ab NEGATIVE NEGATIVE  Lipid panel    Result Value Ref Range   Cholesterol 176 0 - 200 mg/dL   Triglycerides 90.0 0.0 - 149.0 mg/dL   HDL 50.80 >39.00 mg/dL   VLDL 18.0 0.0 - 40.0 mg/dL   LDL Cholesterol 108 (H) 0 - 99 mg/dL   Total CHOL/HDL Ratio 3    NonHDL 125.60       Assessment & Plan:   Problem List Items Addressed This Visit    Advanced care planning/counseling discussion    Advanced directive: living will in chart 06/2014 - no prolonged life support if terminal condition. Would want HCPOA to be sister [name]. HCPOA form provided today.       Healthcare maintenance - Primary    Preventative protocols reviewed and updated unless pt declined. Discussed healthy diet and lifestyle.       Hyperglycemia    New - discussed diet changes to maintain glycemic control. Check A1c next labs. +fmhx DM      Hyperthyroidism    Appreciate endo assistance. Latest  note reviewed. On methimazole 10mg  bid.       Insomnia    Rare trazodone use. Failed OTC remedies.       RESOLVED: Left carotid bruit    Not appreciated today. [place] from 06/2015 WNL      Mild hyperlipidemia    Chronic, stable off meds.       Osteopenia    Continue calcium, vit D, weight bearing exercise      Vitamin D deficiency    Continue supplementation       Other Visit Diagnoses    Need for prophylactic vaccination and inoculation against influenza       Relevant Orders   Flu Vaccine QUAD 36+ mos IM (Completed)       Follow up plan: Return in about 1 year (around 06/10/2017) for annual exam, prior fasting for blood work.  Ria Bush, MD

## 2016-06-10 NOTE — Assessment & Plan Note (Addendum)
Appreciate endo assistance. Latest note reviewed. On methimazole 10mg  bid.

## 2016-06-10 NOTE — Assessment & Plan Note (Signed)
Rare trazodone use. Failed OTC remedies.

## 2016-06-10 NOTE — Assessment & Plan Note (Signed)
Chronic, stable off meds.  

## 2016-06-10 NOTE — Assessment & Plan Note (Signed)
Continue calcium, vit D, weight bearing exercise

## 2016-06-10 NOTE — Assessment & Plan Note (Signed)
Continue supplementation  ?

## 2016-06-10 NOTE — Patient Instructions (Addendum)
Flu shot today Health care power of attorney form provided today. Sugars were abit high - watch added sugars in diet, avoid simple carbs and too many sweetened beverages Return as needed or in 1 year for next physical  Health Maintenance, Female Introduction Adopting a healthy lifestyle and getting preventive care can go a long way to promote health and wellness. Talk with your health care provider about what schedule of regular examinations is right for you. This is a good chance for you to check in with your provider about disease prevention and staying healthy. In between checkups, there are plenty of things you can do on your own. Experts have done a lot of research about which lifestyle changes and preventive measures are most likely to keep you healthy. Ask your health care provider for more information. Weight and diet Eat a healthy diet  Be sure to include plenty of vegetables, fruits, low-fat dairy products, and lean protein.  Do not eat a lot of foods high in solid fats, added sugars, or salt.  Get regular exercise. This is one of the most important things you can do for your health.  Most adults should exercise for at least 150 minutes each week. The exercise should increase your heart rate and make you sweat (moderate-intensity exercise).  Most adults should also do strengthening exercises at least twice a week. This is in addition to the moderate-intensity exercise. Maintain a healthy weight  Body mass index (BMI) is a measurement that can be used to identify possible weight problems. It estimates body fat based on height and weight. Your health care provider can help determine your BMI and help you achieve or maintain a healthy weight.  For females 28 years of age and older:  A BMI below 18.5 is considered underweight.  A BMI of 18.5 to 24.9 is normal.  A BMI of 25 to 29.9 is considered overweight.  A BMI of 30 and above is considered obese. Watch levels of cholesterol  and blood lipids  You should start having your blood tested for lipids and cholesterol at 68 years of age, then have this test every 5 years.  You may need to have your cholesterol levels checked more often if:  Your lipid or cholesterol levels are high.  You are older than 68 years of age.  You are at high risk for heart disease. Cancer screening Lung Cancer  Lung cancer screening is recommended for adults 53-60 years old who are at high risk for lung cancer because of a history of smoking.  A yearly low-dose CT scan of the lungs is recommended for people who:  Currently smoke.  Have quit within the past 15 years.  Have at least a 30-pack-year history of smoking. A pack year is smoking an average of one pack of cigarettes a day for 1 year.  Yearly screening should continue until it has been 15 years since you quit.  Yearly screening should stop if you develop a health problem that would prevent you from having lung cancer treatment. Breast Cancer  Practice breast self-awareness. This means understanding how your breasts normally appear and feel.  It also means doing regular breast self-exams. Let your health care provider know about any changes, no matter how small.  If you are in your 20s or 30s, you should have a clinical breast exam (CBE) by a health care provider every 1-3 years as part of a regular health exam.  If you are 2 or older, have a CBE  every year. Also consider having a breast X-ray (mammogram) every year.  If you have a family history of breast cancer, talk to your health care provider about genetic screening.  If you are at high risk for breast cancer, talk to your health care provider about having an MRI and a mammogram every year.  Breast cancer gene (BRCA) assessment is recommended for women who have family members with BRCA-related cancers. BRCA-related cancers include:  Breast.  Ovarian.  Tubal.  Peritoneal cancers.  Results of the assessment  will determine the need for genetic counseling and BRCA1 and BRCA2 testing. Cervical Cancer  Your health care provider may recommend that you be screened regularly for cancer of the pelvic organs (ovaries, uterus, and vagina). This screening involves a pelvic examination, including checking for microscopic changes to the surface of your cervix (Pap test). You may be encouraged to have this screening done every 3 years, beginning at age 80.  For women ages 59-65, health care providers may recommend pelvic exams and Pap testing every 3 years, or they may recommend the Pap and pelvic exam, combined with testing for human papilloma virus (HPV), every 5 years. Some types of HPV increase your risk of cervical cancer. Testing for HPV may also be done on women of any age with unclear Pap test results.  Other health care providers may not recommend any screening for nonpregnant women who are considered low risk for pelvic cancer and who do not have symptoms. Ask your health care provider if a screening pelvic exam is right for you.  If you have had past treatment for cervical cancer or a condition that could lead to cancer, you need Pap tests and screening for cancer for at least 20 years after your treatment. If Pap tests have been discontinued, your risk factors (such as having a new sexual partner) need to be reassessed to determine if screening should resume. Some women have medical problems that increase the chance of getting cervical cancer. In these cases, your health care provider may recommend more frequent screening and Pap tests. Colorectal Cancer  This type of cancer can be detected and often prevented.  Routine colorectal cancer screening usually begins at 68 years of age and continues through 68 years of age.  Your health care provider may recommend screening at an earlier age if you have risk factors for colon cancer.  Your health care provider may also recommend using home test kits to check  for hidden blood in the stool.  A small camera at the end of a tube can be used to examine your colon directly (sigmoidoscopy or colonoscopy). This is done to check for the earliest forms of colorectal cancer.  Routine screening usually begins at age 67.  Direct examination of the colon should be repeated every 5-10 years through 68 years of age. However, you may need to be screened more often if early forms of precancerous polyps or small growths are found. Skin Cancer  Check your skin from head to toe regularly.  Tell your health care provider about any new moles or changes in moles, especially if there is a change in a mole's shape or color.  Also tell your health care provider if you have a mole that is larger than the size of a pencil eraser.  Always use sunscreen. Apply sunscreen liberally and repeatedly throughout the day.  Protect yourself by wearing long sleeves, pants, a wide-brimmed hat, and sunglasses whenever you are outside. Heart disease, diabetes, and high blood  pressure  High blood pressure causes heart disease and increases the risk of stroke. High blood pressure is more likely to develop in:  People who have blood pressure in the high end of the normal range (130-139/85-89 mm Hg).  People who are overweight or obese.  People who are African American.  If you are 66-62 years of age, have your blood pressure checked every 3-5 years. If you are 58 years of age or older, have your blood pressure checked every year. You should have your blood pressure measured twice-once when you are at a hospital or clinic, and once when you are not at a hospital or clinic. Record the average of the two measurements. To check your blood pressure when you are not at a hospital or clinic, you can use:  An automated blood pressure machine at a pharmacy.  A home blood pressure monitor.  If you are between 20 years and 52 years old, ask your health care provider if you should take aspirin  to prevent strokes.  Have regular diabetes screenings. This involves taking a blood sample to check your fasting blood sugar level.  If you are at a normal weight and have a low risk for diabetes, have this test once every three years after 68 years of age.  If you are overweight and have a high risk for diabetes, consider being tested at a younger age or more often. Preventing infection Hepatitis B  If you have a higher risk for hepatitis B, you should be screened for this virus. You are considered at high risk for hepatitis B if:  You were born in a country where hepatitis B is common. Ask your health care provider which countries are considered high risk.  Your parents were born in a high-risk country, and you have not been immunized against hepatitis B (hepatitis B vaccine).  You have HIV or AIDS.  You use needles to inject street drugs.  You live with someone who has hepatitis B.  You have had sex with someone who has hepatitis B.  You get hemodialysis treatment.  You take certain medicines for conditions, including cancer, organ transplantation, and autoimmune conditions. Hepatitis C  Blood testing is recommended for:  Everyone born from [birth year] through 1963.  Anyone with known risk factors for hepatitis C. Sexually transmitted infections (STIs)  You should be screened for sexually transmitted infections (STIs) including gonorrhea and chlamydia if:  You are sexually active and are younger than 68 years of age.  You are older than 68 years of age and your health care provider tells you that you are at risk for this type of infection.  Your sexual activity has changed since you were last screened and you are at an increased risk for chlamydia or gonorrhea. Ask your health care provider if you are at risk.  If you do not have HIV, but are at risk, it may be recommended that you take a prescription medicine daily to prevent HIV infection. This is called pre-exposure  prophylaxis (PrEP). You are considered at risk if:  You are sexually active and do not regularly use condoms or know the HIV status of your partner(s).  You take drugs by injection.  You are sexually active with a partner who has HIV. Talk with your health care provider about whether you are at high risk of being infected with HIV. If you choose to begin PrEP, you should first be tested for HIV. You should then be tested every 3 months for  as long as you are taking PrEP. Pregnancy  If you are premenopausal and you may become pregnant, ask your health care provider about preconception counseling.  If you may become pregnant, take 400 to 800 micrograms (mcg) of folic acid every day.  If you want to prevent pregnancy, talk to your health care provider about birth control (contraception). Osteoporosis and menopause  Osteoporosis is a disease in which the bones lose minerals and strength with aging. This can result in serious bone fractures. Your risk for osteoporosis can be identified using a bone density scan.  If you are 29 years of age or older, or if you are at risk for osteoporosis and fractures, ask your health care provider if you should be screened.  Ask your health care provider whether you should take a calcium or vitamin D supplement to lower your risk for osteoporosis.  Menopause may have certain physical symptoms and risks.  Hormone replacement therapy may reduce some of these symptoms and risks. Talk to your health care provider about whether hormone replacement therapy is right for you. Follow these instructions at home:  Schedule regular health, dental, and eye exams.  Stay current with your immunizations.  Do not use any tobacco products including cigarettes, chewing tobacco, or electronic cigarettes.  If you are pregnant, do not drink alcohol.  If you are breastfeeding, limit how much and how often you drink alcohol.  Limit alcohol intake to no more than 1 drink  per day for nonpregnant women. One drink equals 12 ounces of beer, 5 ounces of wine, or 1 ounces of hard liquor.  Do not use street drugs.  Do not share needles.  Ask your health care provider for help if you need support or information about quitting drugs.  Tell your health care provider if you often feel depressed.  Tell your health care provider if you have ever been abused or do not feel safe at home. This information is not intended to replace advice given to you by your health care provider. Make sure you discuss any questions you have with your health care provider. Document Released: 01/13/2011 Document Revised: 12/06/2015 Document Reviewed: 04/03/2015  2017 [employer]

## 2016-06-24 ENCOUNTER — Other Ambulatory Visit (INDEPENDENT_AMBULATORY_CARE_PROVIDER_SITE_OTHER): Payer: Federal, State, Local not specified - PPO

## 2016-06-24 DIAGNOSIS — E0590 Thyrotoxicosis, unspecified without thyrotoxic crisis or storm: Secondary | ICD-10-CM | POA: Diagnosis not present

## 2016-06-24 LAB — T3, FREE: T3 FREE: 5.2 pg/mL — AB (ref 2.3–4.2)

## 2016-06-24 LAB — T4, FREE: FREE T4: 2.11 ng/dL — AB (ref 0.60–1.60)

## 2016-06-27 ENCOUNTER — Encounter: Payer: Self-pay | Admitting: Endocrinology

## 2016-06-27 ENCOUNTER — Ambulatory Visit (INDEPENDENT_AMBULATORY_CARE_PROVIDER_SITE_OTHER): Payer: Federal, State, Local not specified - PPO | Admitting: Endocrinology

## 2016-06-27 VITALS — BP 128/74 | HR 82 | Ht 61.0 in | Wt 139.0 lb

## 2016-06-27 DIAGNOSIS — E0590 Thyrotoxicosis, unspecified without thyrotoxic crisis or storm: Secondary | ICD-10-CM

## 2016-06-27 NOTE — Patient Instructions (Addendum)
Take 2 pills am and 1 pm  COMMON QUESTIONS ABOUT RADIOACTIVE IODINE TREATMENT   Why is radioactive iodine used?  Radioactive iodine is a very common option for treating an overactive thyroid. Normally the thyroid gland uses iodine to make thyroid hormone. An overactive thyroid gland extracts the iodine more completely from the bloodstream. When radioactive iodine is given orally most of it is retained within the overactive thyroid.  The concentrated radioactivity slowly destroys the thyroid cells and controls the over activity.  Because the radioactive rays travel only a very short distance other organs are not affected.  Thus the radioactive iodine works in a targeted manner effectively and safely.   How is radioactive iodine given?  It is usually given as a capsule in a single dose.  First, a very small test dose of radioactive iodine is given and the amount retained in the thyroid is measured the next day with a special counter.  This helps [place] calculate the dose of radioactive iodine to be given for treatment.  The radioactive iodine is given under the supervision of a Radiologist with the proper precautions.  Since the effective dose of the radioactive iodine is approximate, rarely one may need a second dose to control the over activity.Thus the treatment is extremely simple to take.  What happens after the radioactive iodine is given?  There is a gradual reduction in the over activity of the thyroid.  Initially it takes time to get rid of the excess thyroid hormone already present in the body.  With the slow destruction of the thyroid cells the high levels start coming down after 2 to 3 weeks.  It may take up to 8 weeks to completely control the thyroid.  Usually most of the thyroid cells are destroyed and thyroid levels start getting low by two months.  However this will vary from patient to patient and the thyroid levels may remain normal for some time.  It is very important to have regular  follow-up after the treatment.  Once the thyroid levels get low you will be started on a thyroid supplement, taken once daily for life.    Are there any side effects of radioactive iodine?  Generally no side effects are encountered.  Rarely one may have discomfort and swelling in the thyroid gland for a few days.  This should be reported if there is significant pain.  The radiation exposure to internal organs from radioactive iodine is no more than in a kidney x-ray or barium studies.  There are no effects on reproductive organs but women who are pregnant or nursing should not take radioactive iodine.  No long-term effects including cancer have been seen.  Women can have children after treatment with radioactive iodine although it is recommended to avoid pregnancy for about six months.

## 2016-06-27 NOTE — Progress Notes (Signed)
Patient ID: Kelsey Yoder, female   DOB: 09/15/1945, 70 y.o.   MRN: [id number]                                                                                                               Reason for Appointment:  Hyperthyroidism, follow-up  Referring physician: Danise Mina    History of Present Illness:   Prior to the consultation she had a history for the prior 2-3 months  of palpitations with her heart racing at times especially at night She was also having ankle swelling but no other typical symptoms of hyperthyroidism including weight loss.  Her labs showed that she had a positive result for thyrotropin receptor antibody measuring 13.5  Because of continued increase in her thyroid levels her methimazole dose has been increased She is taking methimazole, currently 10 mg 2x a day  She thinks that she may occasionally feels warm but otherwise does not feel as tired and has less palpitations She has no side effects from methimazole and is taking this consistently twice a day  No significant weight change   Wt Readings from Last 3 Encounters:  06/27/16 139 lb (63 kg)  06/10/16 138 lb (62.6 kg)  05/16/16 137 lb (62.1 kg)   Thyroid levels from Recent labs are:     Lab Results  Component Value Date   FREET4 2.11 (H) 06/24/2016   FREET4 2.45 (H) 05/16/2016   FREET4 3.54 (H) 04/18/2016   T3FREE 5.2 (H) 06/24/2016   T3FREE 6.5 (H) 05/16/2016   T3FREE 8.6 (H) 03/18/2016   TSH 0.03 (L) 03/18/2016   TSH 0.08 (L) 01/03/2016   TSH 0.12 (L) 12/25/2015        Allergies as of 06/27/2016   No Known Allergies     Medication List       Accurate as of 06/27/16 10:59 AM. Always use your most recent med list.          Calcium Carb-Cholecalciferol 600-800 MG-UNIT Tabs Take 1 tablet by mouth daily.   fish oil-omega-3 fatty acids 1000 MG capsule Take 1 g by mouth daily.   methimazole 10 MG tablet Commonly known as:  TAPAZOLE Take 1 tablet (10 mg total) by mouth 2  (two) times daily. Take 2 times daily   multivitamin tablet Take 1 tablet by mouth daily.   OVER THE COUNTER MEDICATION Take 1 capsule by mouth daily. Tumeric   traZODone 50 MG tablet Commonly known as:  DESYREL Take 0.5-1 tablets (25-50 mg total) by mouth at bedtime as needed for sleep.           Past Medical History:  Diagnosis Date  . Aortic valve sclerosis    last echo 04/2009  . 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 Cataract   . Hyperthyroidism 01/07/2016   Dr Dwyane Dee, positive TSHR Ab  . Osteopenia   . PONV (postoperative nausea and vomiting)    post-op nausea  . Sinus drainage     Past Surgical History:  Procedure Laterality Date  .  BREAST BIOPSY Right 2010   benign   . CARPAL TUNNEL RELEASE Right 04/25/2013   CARPAL TUNNEL RELEASE;  Surgeon: Tennis Must, MD;  Location: [facility]  . CATARACT EXTRACTION Bilateral 2013  . COLONOSCOPY  2002   rectosigmoid bulge thought 2/2 fibroids  . COLONOSCOPY  2005   polyps, diverticulosis, rpt 7-10 yrs ([facility])  . COLONOSCOPY  08/2015   HP, mild diverticulosis, rpt 5 yrs  ([facility])  . DENTAL SURGERY     dental implants  . ESOPHAGOGASTRODUODENOSCOPY  2005   antral gastropathy, neg H pylori screen ([facility])  . TONSILLECTOMY  1976  . TOTAL ABDOMINAL HYSTERECTOMY W/ BILATERAL SALPINGOOPHORECTOMY  2008   h/o abnormal pap smears    Family History  Problem Relation Age of Onset  . Diabetes Mother     double amputee  . Congestive Heart Failure Mother   . Diabetes Father   . Stroke Sister   . Lupus Sister     X 2  . Diabetes Brother   . Cancer Brother     duodenal, prostate   . Stroke Maternal Grandmother   . Stroke Maternal Grandfather   . Breast cancer Sister 66  . Colon cancer Neg Hx   . Colon polyps Neg Hx   . Thyroid disease Neg Hx     Social History:  reports that she has never smoked. She has never used smokeless tobacco. She reports that she does not drink alcohol or use  drugs.  Allergies: No Known Allergies    Review of Systems  She has had problems with insomnia, takes trazodone as needed with relief    Examination:   BP 128/74   Pulse 82   Ht 5\' 1"  (1.549 m)   Wt 139 lb (63 kg)   SpO2 98%   BMI 26.26 kg/m     She is not  anxious Neck: The thyroid is enlarged About 2-2-1/2 times normal slightly irregular and firm on the right side.    Has  enlargement on the left also, firm and about 1-1/2-2 times normal Biceps reflexes appear slightly brisk No tremor Skin appears normal    Assessment/Plan:   Hyperthyroidism,  from Graves' disease   She has been on 10 mg of methimazole twice daily now and symptomatically doing better Pulse rate is not as fast compared to the last visit However she still appears to have a significant thyroid enlargement Even with 10 mg twice a day her thyroid levels are still high especially free T3 level  Discussed with the patient that she is likely not going to get into remission without I-131 Discussed autoimmune causation of Graves' disease and need for definitive treatment She is hesitant about doing this now but will review the literature given today  She will increase her methimazole to 30 mg a day and follow-up in 6 weeks    Brooklyne Radke 06/27/2016, 10:59 AM

## 2016-07-24 ENCOUNTER — Other Ambulatory Visit: Payer: Federal, State, Local not specified - PPO

## 2016-08-04 ENCOUNTER — Other Ambulatory Visit (INDEPENDENT_AMBULATORY_CARE_PROVIDER_SITE_OTHER): Payer: Federal, State, Local not specified - PPO

## 2016-08-04 DIAGNOSIS — E0590 Thyrotoxicosis, unspecified without thyrotoxic crisis or storm: Secondary | ICD-10-CM

## 2016-08-04 LAB — TSH: TSH: 0.06 u[IU]/mL — AB (ref 0.35–4.50)

## 2016-08-04 LAB — T3, FREE: T3, Free: 4.5 pg/mL — ABNORMAL HIGH (ref 2.3–4.2)

## 2016-08-04 LAB — T4, FREE: Free T4: 1.55 ng/dL (ref 0.60–1.60)

## 2016-08-05 LAB — THYROTROPIN RECEPTOR AUTOABS: Thyrotropin Receptor Ab: 17.77 IU/L — ABNORMAL HIGH (ref 0.00–1.75)

## 2016-08-06 ENCOUNTER — Encounter: Payer: Self-pay | Admitting: Endocrinology

## 2016-08-06 ENCOUNTER — Ambulatory Visit (INDEPENDENT_AMBULATORY_CARE_PROVIDER_SITE_OTHER): Payer: Federal, State, Local not specified - PPO | Admitting: Endocrinology

## 2016-08-06 VITALS — BP 130/86 | HR 81 | Ht 61.0 in | Wt 139.0 lb

## 2016-08-06 DIAGNOSIS — E0590 Thyrotoxicosis, unspecified without thyrotoxic crisis or storm: Secondary | ICD-10-CM | POA: Diagnosis not present

## 2016-08-06 MED ORDER — METHIMAZOLE 10 MG PO TABS: 10.0000 mg | ORAL_TABLET | Freq: Three times a day (TID) | ORAL | 3 refills | Status: DC

## 2016-08-06 NOTE — Progress Notes (Signed)
Patient ID: Kelsey Yoder, female   DOB: 1946/07/02, 71 y.o.   MRN: [id number]                                                                                                               Reason for Appointment:  Hyperthyroidism, follow-up  Referring physician: Danise Mina    History of Present Illness:   Prior to the consultation she had a history for the prior 2-3 months  of palpitations with her heart racing at times especially at night She was also having ankle swelling but no other typical symptoms of hyperthyroidism including weight loss.  Her labs showed that she had a positive result for thyrotropin receptor antibody measuring 13.5  Because of continued increase in her thyroid levels her methimazole dose has been progressively increased She is taking methimazole, currently 10 mg, 3 tablets a day  She does not complain of palpitations, unusual fatigue, weight change and occasionally may feel warm but not sweaty  Thyroid levels have been persistently high and only now her free T4 is normal range   Wt Readings from Last 3 Encounters:  08/06/16 139 lb (63 kg)  06/27/16 139 lb (63 kg)  06/10/16 138 lb (62.6 kg)   Thyroid levels from Recent labs are:     Lab Results  Component Value Date   FREET4 1.55 08/04/2016   FREET4 2.11 (H) 06/24/2016   FREET4 2.45 (H) 05/16/2016   T3FREE 4.5 (H) 08/04/2016   T3FREE 5.2 (H) 06/24/2016   T3FREE 6.5 (H) 05/16/2016   TSH 0.06 (L) 08/04/2016   TSH 0.03 (L) 03/18/2016   TSH 0.08 (L) 01/03/2016        Allergies as of 08/06/2016   No Known Allergies     Medication List       Accurate as of 08/06/16  1:25 PM. Always use your most recent med list.          Calcium Carb-Cholecalciferol 600-800 MG-UNIT Tabs Take 1 tablet by mouth daily.   fish oil-omega-3 fatty acids 1000 MG capsule Take 1 g by mouth daily.   methimazole 10 MG tablet Commonly known as:  TAPAZOLE Take 1 tablet (10 mg total) by mouth 2 (two) times  daily. Take 2 times daily   multivitamin tablet Take 1 tablet by mouth daily.   OVER THE COUNTER MEDICATION Take 1 capsule by mouth daily. Tumeric   traZODone 50 MG tablet Commonly known as:  DESYREL Take 0.5-1 tablets (25-50 mg total) by mouth at bedtime as needed for sleep.           Past Medical History:  Diagnosis Date  . Aortic valve sclerosis    last echo 04/2009  . 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 Cataract   . Hyperthyroidism 01/07/2016   Dr Dwyane Dee, positive TSHR Ab  . Osteopenia   . PONV (postoperative nausea and vomiting)    post-op nausea  . Sinus drainage     Past Surgical History:  Procedure Laterality Date  . BREAST BIOPSY  Right 2010   benign   . CARPAL TUNNEL RELEASE Right 04/25/2013   CARPAL TUNNEL RELEASE;  Surgeon: Tennis Must, MD;  Location: [facility]  . CATARACT EXTRACTION Bilateral 2013  . COLONOSCOPY  2002   rectosigmoid bulge thought 2/2 fibroids  . COLONOSCOPY  2005   polyps, diverticulosis, rpt 7-10 yrs ([facility])  . COLONOSCOPY  08/2015   HP, mild diverticulosis, rpt 5 yrs  ([facility])  . DENTAL SURGERY     dental implants  . ESOPHAGOGASTRODUODENOSCOPY  2005   antral gastropathy, neg H pylori screen ([facility])  . TONSILLECTOMY  1976  . TOTAL ABDOMINAL HYSTERECTOMY W/ BILATERAL SALPINGOOPHORECTOMY  2008   h/o abnormal pap smears    Family History  Problem Relation Age of Onset  . Diabetes Mother     double amputee  . Congestive Heart Failure Mother   . Diabetes Father   . Stroke Sister   . Lupus Sister     X 2  . Diabetes Brother   . Cancer Brother     duodenal, prostate   . Stroke Maternal Grandmother   . Stroke Maternal Grandfather   . Breast cancer Sister 37  . Colon cancer Neg Hx   . Colon polyps Neg Hx   . Thyroid disease Neg Hx     Social History:  reports that she has never smoked. She has never used smokeless tobacco. She reports that she does not drink alcohol or use  drugs.  Allergies: No Known Allergies    Review of Systems  She has had problems with insomnia, takes trazodone as needed with relief    Examination:   BP 130/86   Pulse 81   Ht 5\' 1"  (1.549 m)   Wt 139 lb (63 kg)   SpO2 97%   BMI 26.26 kg/m      The thyroid is enlarged About 2 times normal slightly irregular and firm on the right side.    Has  enlargement on the left also, firm and about 1-1/2  times normal Biceps reflexes appear slightly brisk No tremor Skin appears normal    Assessment/Plan:   Hyperthyroidism,  from Graves' disease   She has been on 30mg  of methimazole since her last visit Although she symptomatically doing well her free T3 level is still high Overall thyroid levels are improved however Goiter appears relatively smaller   Currently her thyrotropin receptor antibody is still high and not improved Discussed that it is unlikely that she will have remission using methimazole and again recommend I-131 treatment  She  is still hesitant about doing this and will let us know by the next visit Meanwhile will continue 30 mg  methimazole  She will follow-up in 6 weeks    Surabhi Gadea 08/06/2016, 1:25 PM

## 2016-09-08 ENCOUNTER — Other Ambulatory Visit (INDEPENDENT_AMBULATORY_CARE_PROVIDER_SITE_OTHER): Payer: Federal, State, Local not specified - PPO

## 2016-09-08 DIAGNOSIS — E0590 Thyrotoxicosis, unspecified without thyrotoxic crisis or storm: Secondary | ICD-10-CM

## 2016-09-08 LAB — T3, FREE: T3, Free: 4.2 pg/mL (ref 2.3–4.2)

## 2016-09-08 LAB — T4, FREE: FREE T4: 1.21 ng/dL (ref 0.60–1.60)

## 2016-09-10 ENCOUNTER — Encounter: Payer: Self-pay | Admitting: Endocrinology

## 2016-09-10 ENCOUNTER — Ambulatory Visit (INDEPENDENT_AMBULATORY_CARE_PROVIDER_SITE_OTHER): Payer: Federal, State, Local not specified - PPO | Admitting: Endocrinology

## 2016-09-10 VITALS — BP 124/70 | HR 74 | Ht 61.0 in | Wt 143.0 lb

## 2016-09-10 DIAGNOSIS — E0590 Thyrotoxicosis, unspecified without thyrotoxic crisis or storm: Secondary | ICD-10-CM | POA: Diagnosis not present

## 2016-09-10 NOTE — Patient Instructions (Signed)
Stop methimazole 5-6 days before test dose and restart 3 days after Rx with iodine

## 2016-09-10 NOTE — Progress Notes (Signed)
Patient ID: Kelsey Yoder, female   DOB: 1946-06-27, 71 y.o.   MRN: [id number]                                                                                                               Reason for Appointment:  Hyperthyroidism, follow-up  Referring physician: Danise Yoder    History of Present Illness:   Prior to the consultation she had a history for the prior 2-3 months  of palpitations with her heart racing at times especially at night She was also having ankle swelling but no other typical symptoms of hyperthyroidism including weight loss.  Her labs showed that she had a positive result for thyrotropin receptor antibody measuring 13.5  Because of continued increase in her thyroid levels her methimazole dose has been progressively increased She is taking methimazole, currently 10 mg, 3 tablets a day  Her dose was continued unchanged in 1/18  She does not complain of palpitations, unusual fatigue, weight change and occasionally may feel warm  Weight has gone up slightly She had been reluctant to consider I-131 treatment on previous visits  Thyroid levels have been persistently high but now her levels are per normal   Wt Readings from Last 3 Encounters:  09/10/16 143 lb (64.9 kg)  08/06/16 139 lb (63 kg)  06/27/16 139 lb (63 kg)    Thyroid levels from Recent labs are:     Lab Results  Component Value Date   FREET4 1.21 09/08/2016   FREET4 1.55 08/04/2016   FREET4 2.11 (H) 06/24/2016   T3FREE 4.2 09/08/2016   T3FREE 4.5 (H) 08/04/2016   T3FREE 5.2 (H) 06/24/2016   TSH 0.06 (L) 08/04/2016   TSH 0.03 (L) 03/18/2016   TSH 0.08 (L) 01/03/2016   Lab Results  Component Value Date   THYROTRECAB 17.77 (H) 08/04/2016   THYROTRECAB 13.86 (H) 03/18/2016        Allergies as of 09/10/2016   No Known Allergies     Medication List       Accurate as of 09/10/16  1:43 PM. Always use your most recent med list.          Calcium Carb-Cholecalciferol 600-800  MG-UNIT Tabs Take 1 tablet by mouth daily.   fish oil-omega-3 fatty acids 1000 MG capsule Take 1 g by mouth daily.   methimazole 10 MG tablet Commonly known as:  TAPAZOLE Take 1 tablet (10 mg total) by mouth 3 (three) times daily.   multivitamin tablet Take 1 tablet by mouth daily.   OVER THE COUNTER MEDICATION Take 1 capsule by mouth daily. Tumeric   traZODone 50 MG tablet Commonly known as:  DESYREL Take 0.5-1 tablets (25-50 mg total) by mouth at bedtime as needed for sleep.           Past Medical History:  Diagnosis Date  . Aortic valve sclerosis    last echo 04/2009  . 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 Cataract   . Hyperthyroidism 01/07/2016   Dr Kelsey Yoder, positive TSHR Ab  .  Osteopenia   . PONV (postoperative nausea and vomiting)    post-op nausea  . Sinus drainage     Past Surgical History:  Procedure Laterality Date  . BREAST BIOPSY Right 2010   benign   . CARPAL TUNNEL RELEASE Right 04/25/2013   CARPAL TUNNEL RELEASE;  Surgeon: Kelsey Must, MD;  Location: [facility]  . CATARACT EXTRACTION Bilateral 2013  . COLONOSCOPY  2002   rectosigmoid bulge thought 2/2 fibroids  . COLONOSCOPY  2005   polyps, diverticulosis, rpt 7-10 yrs (Kelsey Yoder)  . COLONOSCOPY  08/2015   HP, mild diverticulosis, rpt 5 yrs  (Kelsey Yoder)  . DENTAL SURGERY     dental implants  . ESOPHAGOGASTRODUODENOSCOPY  2005   antral gastropathy, neg H pylori screen (Kelsey Yoder)  . TONSILLECTOMY  1976  . TOTAL ABDOMINAL HYSTERECTOMY W/ BILATERAL SALPINGOOPHORECTOMY  2008   h/o abnormal pap smears    Family History  Problem Relation Age of Onset  . Diabetes Mother     double amputee  . Congestive Heart Failure Mother   . Diabetes Father   . Stroke Sister   . Lupus Sister     X 2  . Diabetes Brother   . Cancer Brother     duodenal, prostate   . Stroke Maternal Grandmother   . Stroke Maternal Grandfather   . Breast cancer Sister 90  . Colon cancer Neg Hx   .  Colon polyps Neg Hx   . Thyroid disease Neg Hx     Social History:  reports that she has never smoked. She has never used smokeless tobacco. She reports that she does not drink alcohol or use drugs.  Allergies: No Known Allergies    Review of Systems  She takes trazodone as needed with relief    Examination:   BP 124/70   Pulse 74   Ht 5\' 1"  (1.549 m)   Wt 143 lb (64.9 kg)   SpO2 98%   BMI 27.02 kg/m      The thyroid is enlarged 2 times normal slightly irregular and firm on the right side.    Has  enlargement on the left which is firm and about 1-1/2  times normal Biceps reflexes appear normal No tremor Skin appears normal, not unusually warm or diaphoretic    Assessment/Plan:   Hyperthyroidism,  from Graves' disease   She has been on 30mg  of methimazole for some time She has done well symptomatically Still has persistent goiter Her thyroid levels are up are normal, free T4 is slightly improved  In 1/18 her thyrotropin receptor antibody is still high and not improved Discussed that it is unlikely that she will have remission using methimazole and again recommend I-131 treatment  She  is finally agreeing to this and will proceed Discussed how this will be done  Meanwhile will continue 30 mg  methimazole  She will follow-up in 6 weeks    Kelsey Yoder 09/10/2016, 1:43 PM

## 2016-09-24 ENCOUNTER — Encounter (HOSPITAL_COMMUNITY): Payer: Federal, State, Local not specified - PPO

## 2016-09-25 ENCOUNTER — Other Ambulatory Visit (HOSPITAL_COMMUNITY): Payer: Federal, State, Local not specified - PPO

## 2016-10-02 ENCOUNTER — Encounter (HOSPITAL_COMMUNITY)
Admission: RE | Admit: 2016-10-02 | Discharge: 2016-10-02 | Disposition: A | Discharge status: Home / Self Care | Payer: Federal, State, Local not specified - PPO | Source: Ambulatory Visit | Attending: Endocrinology | Admitting: Endocrinology

## 2016-10-02 DIAGNOSIS — E0590 Thyrotoxicosis, unspecified without thyrotoxic crisis or storm: Secondary | ICD-10-CM | POA: Insufficient documentation

## 2016-10-02 MED ORDER — SODIUM IODIDE I 131 CAPSULE
11.0000 | Freq: Once | INTRAVENOUS | Status: AC | PRN
  Administered 2016-10-02: 11 via ORAL

## 2016-10-03 ENCOUNTER — Encounter (HOSPITAL_COMMUNITY)
Admission: RE | Admit: 2016-10-03 | Discharge: 2016-10-03 | Disposition: A | Discharge status: Home / Self Care | Payer: Federal, State, Local not specified - PPO | Source: Ambulatory Visit | Attending: Endocrinology | Admitting: Endocrinology

## 2016-10-03 DIAGNOSIS — E0590 Thyrotoxicosis, unspecified without thyrotoxic crisis or storm: Secondary | ICD-10-CM | POA: Diagnosis not present

## 2016-10-06 ENCOUNTER — Other Ambulatory Visit: Payer: Self-pay | Admitting: Endocrinology

## 2016-10-06 DIAGNOSIS — E0590 Thyrotoxicosis, unspecified without thyrotoxic crisis or storm: Secondary | ICD-10-CM

## 2016-10-06 NOTE — Progress Notes (Signed)
Please let patient know that the radioactive uptake test is 82% and I will order the treatment with radioactive iodine Restart methimazole after the treatment is done

## 2016-10-16 ENCOUNTER — Ambulatory Visit (HOSPITAL_COMMUNITY): Payer: Federal, State, Local not specified - PPO

## 2016-10-17 ENCOUNTER — Other Ambulatory Visit: Payer: Federal, State, Local not specified - PPO

## 2016-10-22 ENCOUNTER — Ambulatory Visit: Payer: Federal, State, Local not specified - PPO | Admitting: Endocrinology

## 2016-10-24 ENCOUNTER — Ambulatory Visit (HOSPITAL_COMMUNITY)
Admission: RE | Admit: 2016-10-24 | Discharge: 2016-10-24 | Disposition: A | Discharge status: Home / Self Care | Payer: Federal, State, Local not specified - PPO | Source: Ambulatory Visit | Attending: Endocrinology | Admitting: Endocrinology

## 2016-10-24 DIAGNOSIS — E0590 Thyrotoxicosis, unspecified without thyrotoxic crisis or storm: Secondary | ICD-10-CM | POA: Insufficient documentation

## 2016-10-24 MED ORDER — SODIUM IODIDE I 131 CAPSULE
12.0000 | Freq: Once | INTRAVENOUS | Status: AC | PRN
  Administered 2016-10-24: 12 via ORAL

## 2016-11-05 ENCOUNTER — Encounter: Payer: Self-pay | Admitting: Obstetrics & Gynecology

## 2016-11-05 ENCOUNTER — Ambulatory Visit (INDEPENDENT_AMBULATORY_CARE_PROVIDER_SITE_OTHER): Payer: Federal, State, Local not specified - PPO | Admitting: Obstetrics & Gynecology

## 2016-11-05 VITALS — BP 124/73 | HR 87 | Ht 59.5 in | Wt 142.0 lb

## 2016-11-05 DIAGNOSIS — M85852 Other specified disorders of bone density and structure, left thigh: Secondary | ICD-10-CM

## 2016-11-05 DIAGNOSIS — Z1231 Encounter for screening mammogram for malignant neoplasm of breast: Secondary | ICD-10-CM

## 2016-11-05 DIAGNOSIS — Z01419 Encounter for gynecological examination (general) (routine) without abnormal findings: Secondary | ICD-10-CM | POA: Diagnosis not present

## 2016-11-05 NOTE — Progress Notes (Signed)
GYNECOLOGY ANNUAL PREVENTATIVE CARE ENCOUNTER NOTE  Subjective:   Kelsey Yoder is a 71 y.o. G0P0 PMP female  s/p TAH/BSO for benign indications in 2008 here for a routine annual gynecologic exam.  Current complaints: none.   Denies abnormal vaginal bleeding, discharge, vasomotor symptoms, pelvic pain, problems with intercourse or other gynecologic concerns.  Was diagnosed with hyperthyroidism late last year and is followed by Dr. Dwyane Dee (Endocrinology).   Gynecologic History No LMP recorded. Patient has had a hysterectomy. Normal vaginal cuff pap smears from 2008-2013; no further paps needed  Healthcare Maintenance Last mammogram 10/23/15 Normal Last DEXA scan 10/03/14 Osteopenia of her hip (neck of left femur). On adequate calcium (1200 mg/day) and Vitamin D (800 International Units/day) intake, as well as weight bearing exercises. Next bone mineral density scan is recommended in 2018. Colonoscopy 09/04/2015  Benign polyps, mild diverticulosis. High fiber diet recommended  Obstetric History OB History  Gravida Para Term Preterm AB Living  0 0 0 0 0 0  SAB TAB Ectopic Multiple Live Births  0 0 0 0          Past Medical History:  Diagnosis Date  . Aortic valve sclerosis    last echo 04/2009  . 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 Cataract   . Hyperthyroidism 01/07/2016   Dr Dwyane Dee, positive TSHR Ab  . Osteopenia   . PONV (postoperative nausea and vomiting)    post-op nausea  . Sinus drainage     Past Surgical History:  Procedure Laterality Date  . BREAST BIOPSY Right 2010   benign   . CARPAL TUNNEL RELEASE Right 04/25/2013   CARPAL TUNNEL RELEASE;  Surgeon: Tennis Must, MD;  Location: [facility]  . CATARACT EXTRACTION Bilateral 2013  . COLONOSCOPY  2002   rectosigmoid bulge thought 2/2 fibroids  . COLONOSCOPY  2005   polyps, diverticulosis, rpt 7-10 yrs ([facility])  . COLONOSCOPY  08/2015   HP, mild diverticulosis, rpt 5 yrs   ([facility])  . DENTAL SURGERY     dental implants  . ESOPHAGOGASTRODUODENOSCOPY  2005   antral gastropathy, neg H pylori screen ([facility])  . TONSILLECTOMY  1976  . TOTAL ABDOMINAL HYSTERECTOMY W/ BILATERAL SALPINGOOPHORECTOMY  2008   h/o abnormal pap smears    Current Outpatient Prescriptions on File Prior to Visit  Medication Sig Dispense Refill  . Calcium Carb-Cholecalciferol 600-800 MG-UNIT TABS Take 1 tablet by mouth daily.    . fish oil-omega-3 fatty acids 1000 MG capsule Take 1 g by mouth daily.    . methimazole (TAPAZOLE) 10 MG tablet Take 1 tablet (10 mg total) by mouth 3 (three) times daily. 90 tablet 3  . Multiple Vitamin (MULTIVITAMIN) tablet Take 1 tablet by mouth daily.    Marland Kitchen OVER THE COUNTER MEDICATION Take 1 capsule by mouth daily. Tumeric    . traZODone (DESYREL) 50 MG tablet Take 0.5-1 tablets (25-50 mg total) by mouth at bedtime as needed for sleep. (Patient not taking: Reported on 09/10/2016) 30 tablet 1   No current facility-administered medications on file prior to visit.     No Known Allergies  Social History   Social History  . Marital status: Married    Spouse name: N/A  . Number of children: N/A  . Years of education: N/A   Occupational History  . Not on file.   Social History Main Topics  . Smoking status: Never Smoker  . Smokeless tobacco: Never Used  . Alcohol use No  .  Drug use: No  . Sexual activity: Yes    Partners: Male    Birth control/ protection: Surgical   Other Topics Concern  . Not on file   Social History Narrative   Caffeine: occasional   Lives alone, no pets   Occupation: retired, was in Armed forces operational officer   Edu: master's   Activity: goes to gym 3-4x/wk   Diet: some water, fruits and vegetables daily    Family History  Problem Relation Age of Onset  . Diabetes Mother     double amputee  . Congestive Heart Failure Mother   . Diabetes Father   . Stroke Sister   . Lupus Sister     X 2  . Diabetes Brother   . Cancer  Brother     duodenal, prostate   . Stroke Maternal Grandmother   . Stroke Maternal Grandfather   . Breast cancer Sister 4  . Colon cancer Neg Hx   . Colon polyps Neg Hx   . Thyroid disease Neg Hx     The following portions of the patient's history were reviewed and updated as appropriate: allergies, current medications, past family history, past medical history, past social history, past surgical history and problem list.  Review of Systems Pertinent items noted in HPI and remainder of comprehensive ROS otherwise negative.   Objective:  BP 124/73   Pulse 87   Ht 4' 11.5" (1.511 m)   Wt 142 lb (64.4 kg)   BMI 28.20 kg/m  CONSTITUTIONAL: Well-developed, well-nourished female in no acute distress.  HENT:  Normocephalic, atraumatic, External right and left ear normal. Oropharynx is clear and moist EYES: Conjunctivae and EOM are normal. Pupils are equal, round, and reactive to light. No scleral icterus.  NECK: Normal range of motion, supple, no masses.  SKIN: Skin is warm and dry. No rash noted. Not diaphoretic. No erythema. No pallor. NEUROLOGIC: Alert and oriented to person, place, and time. Normal reflexes, muscle tone coordination. No cranial nerve deficit noted. PSYCHIATRIC: Normal mood and affect. Normal behavior. Normal judgment and thought content. CARDIOVASCULAR: Normal heart rate noted, regular rhythm RESPIRATORY: Clear to auscultation bilaterally. Effort and breath sounds normal, no problems with respiration noted. BREASTS: Symmetric in size. Small fibroadenomas palpated in left breast, no other masses, skin changes, nipple drainage, or lymphadenopathy bilaterally.  ABDOMEN: Soft, normal bowel sounds, no distention noted.  No tenderness, rebound or guarding.  PELVIC: Normal external female genitalia. Vagina is with significant atrophy, well-healed vaginal cuff. Normal discharge. No masses palpated on bimanual exam  MUSCULOSKELETAL: Normal range of motion. No tenderness.  No  cyanosis, clubbing, or edema.  2+ distal pulses.   Assessment and Plan:  1. Osteopenia of neck of left femur Bone density scan scheduled, will follow up results and manage accordingly. - DG Bone Density; Future  2. Visit for screening mammogram Mammogram scheduled - MM SCREENING BREAST TOMO BILATERAL; Future  3. Encounter for gynecological examination without abnormal finding Normal postmenopausal gynecologic examination. Routine preventative health maintenance measures emphasized. Please refer to After Visit Summary for other counseling recommendations.    Verita Schneiders, MD, [place] Attending Obstetrician & Gynecologist, [place] [facility]

## 2016-11-05 NOTE — Patient Instructions (Signed)
Preventive Care 65 Years and Older, Female Preventive care refers to lifestyle choices and visits with your health care provider that can promote health and wellness. What does preventive care include?  A yearly physical exam. This is also called an annual well check.  Dental exams once or twice a year.  Routine eye exams. Ask your health care provider how often you should have your eyes checked.  Personal lifestyle choices, including:  Daily care of your teeth and gums.  Regular physical activity.  Eating a healthy diet.  Avoiding tobacco and drug use.  Limiting alcohol use.  Practicing safe sex.  Taking low-dose aspirin every day.  Taking vitamin and mineral supplements as recommended by your health care provider. What happens during an annual well check? The services and screenings done by your health care provider during your annual well check will depend on your age, overall health, lifestyle risk factors, and family history of disease. Counseling  Your health care provider may ask you questions about your:  Alcohol use.  Tobacco use.  Drug use.  Emotional well-being.  Home and relationship well-being.  Sexual activity.  Eating habits.  History of falls.  Memory and ability to understand (cognition).  Work and work environment.  Reproductive health. Screening  You may have the following tests or measurements:  Height, weight, and BMI.  Blood pressure.  Lipid and cholesterol levels. These may be checked every 5 years, or more frequently if you are over 50 years old.  Skin check.  Lung cancer screening. You may have this screening every year starting at age 55 if you have a 30-pack-year history of smoking and currently smoke or have quit within the past 15 years.  Fecal occult blood test (FOBT) of the stool. You may have this test every year starting at age 50.  Flexible sigmoidoscopy or colonoscopy. You may have a sigmoidoscopy every 5 years or  a colonoscopy every 10 years starting at age 50.  Hepatitis C blood test.  Hepatitis B blood test.  Sexually transmitted disease (STD) testing.  Diabetes screening. This is done by checking your blood sugar (glucose) after you have not eaten for a while (fasting). You may have this done every 1-3 years.  Bone density scan. This is done to screen for osteoporosis. You may have this done starting at age 69.  Mammogram. This may be done every 1-2 years. Talk to your health care provider about how often you should have regular mammograms. Talk with your health care provider about your test results, treatment options, and if necessary, the need for more tests. Vaccines  Your health care provider may recommend certain vaccines, such as:  Influenza vaccine. This is recommended every year.  Tetanus, diphtheria, and acellular pertussis (Tdap, Td) vaccine. You may need a Td booster every 10 years.  Varicella vaccine. You may need this if you have not been vaccinated.  Zoster vaccine. You may need this after age 60.  Measles, mumps, and rubella (MMR) vaccine. You may need at least one dose of MMR if you were born in 1957 or later. You may also need a second dose.  Pneumococcal 13-valent conjugate (PCV13) vaccine. One dose is recommended after age 69.  Pneumococcal polysaccharide (PPSV23) vaccine. One dose is recommended after age 69.  Meningococcal vaccine. You may need this if you have certain conditions.  Hepatitis A vaccine. You may need this if you have certain conditions or if you travel or work in places where you may be exposed to   hepatitis A.  Hepatitis B vaccine. You may need this if you have certain conditions or if you travel or work in places where you may be exposed to hepatitis B.  Haemophilus influenzae type b (Hib) vaccine. You may need this if you have certain conditions. Talk to your health care provider about which screenings and vaccines you need and how often you need  them. This information is not intended to replace advice given to you by your health care provider. Make sure you discuss any questions you have with your health care provider. Document Released: 07/27/2015 Document Revised: 03/19/2016 Document Reviewed: 05/01/2015 Elsevier Interactive Patient Education  2017 [employer]  

## 2016-11-14 ENCOUNTER — Other Ambulatory Visit (INDEPENDENT_AMBULATORY_CARE_PROVIDER_SITE_OTHER): Payer: Federal, State, Local not specified - PPO

## 2016-11-14 DIAGNOSIS — E0590 Thyrotoxicosis, unspecified without thyrotoxic crisis or storm: Secondary | ICD-10-CM

## 2016-11-14 LAB — T3, FREE: T3 FREE: 3 pg/mL (ref 2.3–4.2)

## 2016-11-14 LAB — T4, FREE: Free T4: 0.71 ng/dL (ref 0.60–1.60)

## 2016-11-19 ENCOUNTER — Ambulatory Visit (INDEPENDENT_AMBULATORY_CARE_PROVIDER_SITE_OTHER): Payer: Federal, State, Local not specified - PPO | Admitting: Endocrinology

## 2016-11-19 ENCOUNTER — Encounter: Payer: Self-pay | Admitting: Endocrinology

## 2016-11-19 VITALS — BP 122/74 | HR 81 | Ht 59.5 in | Wt 142.2 lb

## 2016-11-19 DIAGNOSIS — E0590 Thyrotoxicosis, unspecified without thyrotoxic crisis or storm: Secondary | ICD-10-CM

## 2016-11-19 NOTE — Patient Instructions (Signed)
Stop Methimazole 

## 2016-11-19 NOTE — Progress Notes (Signed)
Patient ID: Kelsey Yoder, female   DOB: 1946-01-12, 71 y.o.   MRN: [id number]                                                                                                               Reason for Appointment:  Hyperthyroidism, follow-up  Referring physician: Danise Yoder    History of Present Illness:   Prior to the consultation she had a history for the prior 2-3 months  of palpitations with her heart racing at times especially at night She was also having ankle swelling but no other typical symptoms of hyperthyroidism including weight loss.  Her labs showed that she had a positive result for thyrotropin receptor antibody measuring 13.5  Because of continued increase in her thyroid levels her methimazole dose had been progressively increased  Since she had continued need for antithyroid drugs she was given I-131 treatment on 10/24/16 with 12 mCi, her uptake was 82% She is still taking methimazole, currently 10 mg, 3 tablets a day  She does not complain of palpitations but sometimes feels warm Weight is about the same She says right after her I-131 treatment she started feeling more tired and needed to take a nap but this is better now  Her free T4 level is much improved at 0.71, free T3 is normal at 3.0   Wt Readings from Last 3 Encounters:  11/19/16 142 lb 3.2 oz (64.5 kg)  11/05/16 142 lb (64.4 kg)  09/10/16 143 lb (64.9 kg)    Thyroid levels from labs are:     Lab Results  Component Value Date   FREET4 0.71 11/14/2016   FREET4 1.21 09/08/2016   FREET4 1.55 08/04/2016   T3FREE 3.0 11/14/2016   T3FREE 4.2 09/08/2016   T3FREE 4.5 (H) 08/04/2016   TSH 0.06 (L) 08/04/2016   TSH 0.03 (L) 03/18/2016   TSH 0.08 (L) 01/03/2016   Lab Results  Component Value Date   THYROTRECAB 17.77 (H) 08/04/2016   THYROTRECAB 13.86 (H) 03/18/2016        Allergies as of 11/19/2016   No Known Allergies     Medication List       Accurate as of 11/19/16  1:08 PM. Always use  your most recent med list.          Calcium Carb-Cholecalciferol 600-800 MG-UNIT Tabs Take 1 tablet by mouth daily.   fish oil-omega-3 fatty acids 1000 MG capsule Take 1 g by mouth daily.   methimazole 10 MG tablet Commonly known as:  TAPAZOLE Take 1 tablet (10 mg total) by mouth 3 (three) times daily.   multivitamin tablet Take 1 tablet by mouth daily.   OVER THE COUNTER MEDICATION Take 1 capsule by mouth daily. Tumeric   traZODone 50 MG tablet Commonly known as:  DESYREL Take 0.5-1 tablets (25-50 mg total) by mouth at bedtime as needed for sleep.           Past Medical History:  Diagnosis Date  . Aortic valve sclerosis    last echo 04/2009  .  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 Cataract   . Hyperthyroidism 01/07/2016   Dr Dwyane Dee, positive TSHR Ab  . Osteopenia   . PONV (postoperative nausea and vomiting)    post-op nausea  . Sinus drainage     Past Surgical History:  Procedure Laterality Date  . BREAST BIOPSY Right 2010   benign   . CARPAL TUNNEL RELEASE Right 04/25/2013   CARPAL TUNNEL RELEASE;  Surgeon: Tennis Must, MD;  Location: [facility]  . CATARACT EXTRACTION Bilateral 2013  . COLONOSCOPY  2002   rectosigmoid bulge thought 2/2 fibroids  . COLONOSCOPY  2005   polyps, diverticulosis, rpt 7-10 yrs ([facility])  . COLONOSCOPY  08/2015   HP, mild diverticulosis, rpt 5 yrs  ([facility])  . DENTAL SURGERY     dental implants  . ESOPHAGOGASTRODUODENOSCOPY  2005   antral gastropathy, neg H pylori screen ([facility])  . TONSILLECTOMY  1976  . TOTAL ABDOMINAL HYSTERECTOMY W/ BILATERAL SALPINGOOPHORECTOMY  2008   h/o abnormal pap smears    Family History  Problem Relation Age of Onset  . Diabetes Mother     double amputee  . Congestive Heart Failure Mother   . Diabetes Father   . Stroke Sister   . Lupus Sister     X 2  . Diabetes Brother   . Cancer Brother     duodenal, prostate   . Stroke Maternal Grandmother   . Stroke  Maternal Grandfather   . Breast cancer Sister 44  . Colon cancer Neg Hx   . Colon polyps Neg Hx   . Thyroid disease Neg Hx     Social History:  reports that she has never smoked. She has never used smokeless tobacco. She reports that she does not drink alcohol or use drugs.  Allergies: No Known Allergies    Review of Systems  She takes trazodone as needed with relief    Examination:   BP 122/74   Pulse 81   Ht 4' 11.5" (1.511 m)   Wt 142 lb 3.2 oz (64.5 kg)   SpO2 97%   BMI 28.24 kg/m      The thyroid is enlarged 2 times normal slightly irregular and firm on the right side.    Has  enlargement on the left which is firm and about 1-1/2  times normal Biceps reflexes appear normal No tremor Skin appears normal, not unusually warm or diaphoretic    Assessment/Plan:   Hyperthyroidism,  from Graves' disease   She appears to have had good response to her I-131 treatment about 3 weeks ago with 12 mCi Although her goiter is still about the same her free T4 is lower than before indicating that she is responding to the I-131 She is subjectively doing fairly well  She will now stop the methimazole Discussed possibility of hypothyroidism and about 4-6 weeks  She will follow-up in 5 weeks    Kelsey Yoder 11/19/2016, 1:08 PM

## 2016-12-03 ENCOUNTER — Ambulatory Visit
Admission: RE | Admit: 2016-12-03 | Discharge: 2016-12-03 | Disposition: A | Discharge status: Home / Self Care | Payer: Federal, State, Local not specified - PPO | Source: Ambulatory Visit | Attending: Obstetrics & Gynecology | Admitting: Obstetrics & Gynecology

## 2016-12-03 DIAGNOSIS — Z1231 Encounter for screening mammogram for malignant neoplasm of breast: Secondary | ICD-10-CM

## 2016-12-03 DIAGNOSIS — M85852 Other specified disorders of bone density and structure, left thigh: Secondary | ICD-10-CM

## 2016-12-24 ENCOUNTER — Other Ambulatory Visit (INDEPENDENT_AMBULATORY_CARE_PROVIDER_SITE_OTHER): Payer: Federal, State, Local not specified - PPO

## 2016-12-24 DIAGNOSIS — E0590 Thyrotoxicosis, unspecified without thyrotoxic crisis or storm: Secondary | ICD-10-CM | POA: Diagnosis not present

## 2016-12-24 LAB — T4, FREE: Free T4: 1.55 ng/dL (ref 0.60–1.60)

## 2016-12-24 LAB — TSH

## 2017-01-01 ENCOUNTER — Encounter: Payer: Self-pay | Admitting: Endocrinology

## 2017-01-01 ENCOUNTER — Ambulatory Visit (INDEPENDENT_AMBULATORY_CARE_PROVIDER_SITE_OTHER): Payer: Federal, State, Local not specified - PPO | Admitting: Endocrinology

## 2017-01-01 VITALS — BP 122/70 | HR 85 | Ht 59.5 in | Wt 141.4 lb

## 2017-01-01 DIAGNOSIS — E0590 Thyrotoxicosis, unspecified without thyrotoxic crisis or storm: Secondary | ICD-10-CM

## 2017-01-01 NOTE — Progress Notes (Signed)
Patient ID: Kelsey Yoder, female   DOB: [birth year], 71 y.o.   MRN: [id number]                                                                                                               Reason for Appointment:  Hyperthyroidism, follow-up  Referring physician: Danise Mina    History of Present Illness:   Prior to the consultation she had a history for the prior 2-3 months  of palpitations with her heart racing at times especially at night She was also having ankle swelling but no other typical symptoms of hyperthyroidism including weight loss.  Her labs showed that she had a positive result for thyrotropin receptor antibody measuring 13.5  Because of continued increase in her thyroid levels her methimazole dose had been progressively increased  Since she had continued need for antithyroid drugs she was given I-131 treatment on 10/24/16 with 12 mCi, her uptake was 82% Her methimazole was stopped on her last visit on 5/4 when her free T4 was only 0.71  She does not complain of palpitations but sometimes feels warm, no unusual fatigue Weight is about the same  Her free T4 level is much higher at 1.55, TSH again suppressed   Wt Readings from Last 3 Encounters:  01/01/17 141 lb 6.4 oz (64.1 kg)  11/19/16 142 lb 3.2 oz (64.5 kg)  11/05/16 142 lb (64.4 kg)    Thyroid levels from labs are:     Lab Results  Component Value Date   FREET4 1.55 12/24/2016   FREET4 0.71 11/14/2016   FREET4 1.21 09/08/2016   T3FREE 3.0 11/14/2016   T3FREE 4.2 09/08/2016   T3FREE 4.5 (H) 08/04/2016   TSH <0.01 (L) 12/24/2016   TSH 0.06 (L) 08/04/2016   TSH 0.03 (L) 03/18/2016   Lab Results  Component Value Date   THYROTRECAB 17.77 (H) 08/04/2016   THYROTRECAB 13.86 (H) 03/18/2016        Allergies as of 01/01/2017   No Known Allergies     Medication List       Accurate as of 01/01/17  1:33 PM. Always use your most recent med list.          Calcium Carb-Cholecalciferol 600-800  MG-UNIT Tabs Take 1 tablet by mouth daily.   fish oil-omega-3 fatty acids 1000 MG capsule Take 1 g by mouth daily.   multivitamin tablet Take 1 tablet by mouth daily.   OVER THE COUNTER MEDICATION Take 1 capsule by mouth daily. Tumeric   traZODone 50 MG tablet Commonly known as:  DESYREL Take 0.5-1 tablets (25-50 mg total) by mouth at bedtime as needed for sleep.           Past Medical History:  Diagnosis Date  . Aortic valve sclerosis    last echo 04/2009  . 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 Cataract   . Hyperthyroidism 01/07/2016   Dr Dwyane Dee, positive TSHR Ab  . Osteopenia   . PONV (postoperative nausea and vomiting)    post-op nausea  .  Sinus drainage     Past Surgical History:  Procedure Laterality Date  . BREAST BIOPSY Right 2010   benign   . BREAST EXCISIONAL BIOPSY Right 2010   benign  . CARPAL TUNNEL RELEASE Right 04/25/2013   CARPAL TUNNEL RELEASE;  Surgeon: Tennis Must, MD;  Location: [facility]  . CATARACT EXTRACTION Bilateral 2013  . COLONOSCOPY  2002   rectosigmoid bulge thought 2/2 fibroids  . COLONOSCOPY  2005   polyps, diverticulosis, rpt 7-10 yrs ([facility])  . COLONOSCOPY  08/2015   HP, mild diverticulosis, rpt 5 yrs  ([facility])  . DENTAL SURGERY     dental implants  . ESOPHAGOGASTRODUODENOSCOPY  2005   antral gastropathy, neg H pylori screen ([facility])  . TONSILLECTOMY  1976  . TOTAL ABDOMINAL HYSTERECTOMY W/ BILATERAL SALPINGOOPHORECTOMY  2008   h/o abnormal pap smears    Family History  Problem Relation Age of Onset  . Diabetes Mother        double amputee  . Congestive Heart Failure Mother   . Diabetes Father   . Stroke Sister   . Lupus Sister        X 2  . Diabetes Brother   . Cancer Brother        duodenal, prostate   . Stroke Maternal Grandmother   . Stroke Maternal Grandfather   . Breast cancer Sister 46  . Colon cancer Neg Hx   . Colon polyps Neg Hx   . Thyroid disease Neg Hx     Social  History:  reports that she has never smoked. She has never used smokeless tobacco. She reports that she does not drink alcohol or use drugs.  Allergies: No Known Allergies    Review of Systems  She takes trazodone as needed with relief    Examination:   BP 122/70   Pulse 85   Ht 4' 11.5" (1.511 m)   Wt 141 lb 6.4 oz (64.1 kg)   SpO2 96%   BMI 28.08 kg/m      The thyroid is enlarged 1.5-2 times normal and smooth on the right, relatively smaller on the left Biceps reflexes appear normal No tremor Skin appears normal    Assessment/Plan:   Hyperthyroidism,  from Graves' disease   Although she is now about 2 months after her I-131 treatment with 12 mCi and a good baseline uptake she is not in remission yet Free T4 is now upper normal She is asymptomatic However her thyroid enlargement is still present  Not clear if she is had incomplete treatment of her Graves' disease with her dose 2 months ago  For now will start her back on 5 mg methimazole and reassess in 4 weeks, discussed again that she may have a delayed response to her I-131 treatment and she can call if she starts feeling more fatigued    Kelsey Yoder 01/01/2017, 1:33 PM

## 2017-01-01 NOTE — Patient Instructions (Signed)
Take 1/2 Methimazole daily

## 2017-02-04 ENCOUNTER — Other Ambulatory Visit (INDEPENDENT_AMBULATORY_CARE_PROVIDER_SITE_OTHER): Payer: Federal, State, Local not specified - PPO

## 2017-02-04 DIAGNOSIS — E0590 Thyrotoxicosis, unspecified without thyrotoxic crisis or storm: Secondary | ICD-10-CM | POA: Diagnosis not present

## 2017-02-04 LAB — TSH: TSH: 0.03 u[IU]/mL — ABNORMAL LOW (ref 0.35–4.50)

## 2017-02-04 LAB — T3, FREE: T3 FREE: 3.5 pg/mL (ref 2.3–4.2)

## 2017-02-04 LAB — T4, FREE: FREE T4: 1.09 ng/dL (ref 0.60–1.60)

## 2017-02-06 ENCOUNTER — Encounter: Payer: Self-pay | Admitting: Endocrinology

## 2017-02-06 ENCOUNTER — Ambulatory Visit (INDEPENDENT_AMBULATORY_CARE_PROVIDER_SITE_OTHER): Payer: Federal, State, Local not specified - PPO | Admitting: Endocrinology

## 2017-02-06 VITALS — BP 130/78 | HR 80 | Ht 59.5 in | Wt 145.4 lb

## 2017-02-06 DIAGNOSIS — E0590 Thyrotoxicosis, unspecified without thyrotoxic crisis or storm: Secondary | ICD-10-CM | POA: Diagnosis not present

## 2017-02-06 MED ORDER — METHIMAZOLE 5 MG PO TABS: 5.0000 mg | ORAL_TABLET | Freq: Every day | ORAL | 1 refills | Status: DC

## 2017-02-06 NOTE — Progress Notes (Signed)
Patient ID: Kelsey Yoder, female   DOB: 1945/08/04, 71 y.o.   MRN: [id number]                                                                                                               Reason for Appointment:  Hyperthyroidism, follow-up  Referring physician: Danise Mina    History of Present Illness:   Prior to the consultation she had a history for the prior 2-3 months  of palpitations with her heart racing at times especially at night She was also having ankle swelling but no other typical symptoms of hyperthyroidism including weight loss.  Her labs showed that she had a positive result for thyrotropin receptor antibody measuring 13.5  Since she had continued need for antithyroid drugs she was given I-131 treatment on 10/24/16 with 12 mCi, her uptake was 82% Her methimazole was stopped on her visit on 5/4 when her free T4 was only 0.71  However with her free T4 being upper normal in June this was restarted, taking half of a 10 mg tablet now  She does not complain of unusual fatigue, palpitations but sometimes feels warm  Her free T4 level is now back to normal along with continued suppression of TSH however, T3 is upper normal again   Wt Readings from Last 3 Encounters:  02/06/17 145 lb 6.4 oz (66 kg)  01/01/17 141 lb 6.4 oz (64.1 kg)  11/19/16 142 lb 3.2 oz (64.5 kg)    Thyroid levels from labs are:     Lab Results  Component Value Date   FREET4 1.09 02/04/2017   FREET4 1.55 12/24/2016   FREET4 0.71 11/14/2016   T3FREE 3.5 02/04/2017   T3FREE 3.0 11/14/2016   T3FREE 4.2 09/08/2016   TSH 0.03 (L) 02/04/2017   TSH <0.01 (L) 12/24/2016   TSH 0.06 (L) 08/04/2016   Lab Results  Component Value Date   THYROTRECAB 17.77 (H) 08/04/2016   THYROTRECAB 13.86 (H) 03/18/2016        Allergies as of 02/06/2017   No Known Allergies     Medication List       Accurate as of 02/06/17  4:33 PM. Always use your most recent med list.          Calcium  Carb-Cholecalciferol 600-800 MG-UNIT Tabs Take 1 tablet by mouth daily.   fish oil-omega-3 fatty acids 1000 MG capsule Take 1 g by mouth daily.   methimazole 5 MG tablet Commonly known as:  TAPAZOLE Take 5 mg by mouth daily.   multivitamin tablet Take 1 tablet by mouth daily.   OVER THE COUNTER MEDICATION Take 1 capsule by mouth daily. Tumeric           Past Medical History:  Diagnosis Date  . Aortic valve sclerosis    last echo 04/2009  . 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 Cataract   . Hyperthyroidism 01/07/2016   Dr Dwyane Dee, positive TSHR Ab  . Osteopenia   . PONV (postoperative nausea and vomiting)    post-op  nausea  . Sinus drainage     Past Surgical History:  Procedure Laterality Date  . BREAST BIOPSY Right 2010   benign   . BREAST EXCISIONAL BIOPSY Right 2010   benign  . CARPAL TUNNEL RELEASE Right 04/25/2013   CARPAL TUNNEL RELEASE;  Surgeon: Tennis Must, MD;  Location: [facility]  . CATARACT EXTRACTION Bilateral 2013  . COLONOSCOPY  2002   rectosigmoid bulge thought 2/2 fibroids  . COLONOSCOPY  2005   polyps, diverticulosis, rpt 7-10 yrs ([facility])  . COLONOSCOPY  08/2015   HP, mild diverticulosis, rpt 5 yrs  ([facility])  . DENTAL SURGERY     dental implants  . ESOPHAGOGASTRODUODENOSCOPY  2005   antral gastropathy, neg H pylori screen ([facility])  . TONSILLECTOMY  1976  . TOTAL ABDOMINAL HYSTERECTOMY W/ BILATERAL SALPINGOOPHORECTOMY  2008   h/o abnormal pap smears    Family History  Problem Relation Age of Onset  . Diabetes Mother        double amputee  . Congestive Heart Failure Mother   . Diabetes Father   . Stroke Sister   . Lupus Sister        X 2  . Diabetes Brother   . Cancer Brother        duodenal, prostate   . Stroke Maternal Grandmother   . Stroke Maternal Grandfather   . Breast cancer Sister 60  . Colon cancer Neg Hx   . Colon polyps Neg Hx   . Thyroid disease Neg Hx     Social History:  reports  that she has never smoked. She has never used smokeless tobacco. She reports that she does not drink alcohol or use drugs.  Allergies: No Known Allergies    Review of Systems  Currently not on any description drugs   Examination:   BP 130/78   Pulse 80   Ht 4' 11.5" (1.511 m)   Wt 145 lb 6.4 oz (66 kg)   SpO2 98%   BMI 28.88 kg/m      The thyroid is enlarged 1.5-2 times normal and firm on the right, not significantly enlarged on the left Biceps/triceps reflexes appear normal Skin not unusually warm    Assessment/Plan:   Hyperthyroidism,  from Graves' disease   She appears to have failed her I-131 treatment done about 3 months ago She is currently symptomatically doing well Thyroid levels are improved with taking methimazole since her last visit However she still has a goiter on exam indicating continued residual Graves' disease  Discussed with the patient that follow-up I-131 treatment may be needed but for the time being she can continue methimazole 5 mg daily until her next visit, will reassess her thyrotropin receptor antibody also  Total visit time = 15 minutes  Candra Wegner 02/06/2017, 4:33 PM

## 2017-03-23 ENCOUNTER — Other Ambulatory Visit (INDEPENDENT_AMBULATORY_CARE_PROVIDER_SITE_OTHER): Payer: Federal, State, Local not specified - PPO

## 2017-03-23 DIAGNOSIS — E0590 Thyrotoxicosis, unspecified without thyrotoxic crisis or storm: Secondary | ICD-10-CM | POA: Diagnosis not present

## 2017-03-23 LAB — T4, FREE: FREE T4: 0.73 ng/dL (ref 0.60–1.60)

## 2017-03-23 LAB — T3, FREE: T3, Free: 3.2 pg/mL (ref 2.3–4.2)

## 2017-03-23 LAB — TSH: TSH: 0.02 u[IU]/mL — AB (ref 0.35–4.50)

## 2017-03-25 ENCOUNTER — Ambulatory Visit: Payer: Federal, State, Local not specified - PPO | Admitting: Endocrinology

## 2017-03-27 ENCOUNTER — Encounter: Payer: Self-pay | Admitting: Endocrinology

## 2017-03-27 ENCOUNTER — Ambulatory Visit (INDEPENDENT_AMBULATORY_CARE_PROVIDER_SITE_OTHER): Payer: Federal, State, Local not specified - PPO | Admitting: Endocrinology

## 2017-03-27 VITALS — BP 128/74 | HR 73 | Ht 59.5 in | Wt 146.0 lb

## 2017-03-27 DIAGNOSIS — E0590 Thyrotoxicosis, unspecified without thyrotoxic crisis or storm: Secondary | ICD-10-CM

## 2017-03-27 NOTE — Progress Notes (Signed)
Patient ID: Kelsey Yoder, female   DOB: [birth year], 71 y.o.   MRN: [id number]                                                                                                               Reason for Appointment:  Hyperthyroidism, follow-up  Referring physician: Danise Mina    History of Present Illness:   Prior to the consultation she had a history for the prior 2-3 months  of palpitations with her heart racing at times especially at night She was also having ankle swelling but no other typical symptoms of hyperthyroidism including weight loss.  Her labs showed that she had a positive result for thyrotropin receptor antibody measuring 13.5  Since she had continued need for antithyroid drugs she was given I-131 treatment on 10/24/16 with 12 mCi, her uptake was 82% Her methimazole was stopped on her visit on 11/14/16 when her free T4 was 0.71  However with her free T4 being upper normal in June this was restarted at 5 mg daily She also had a persistent thyroid enlargement The dose was continued in July  She does not complain of unusual fatigue, palpitations or heat intolerance No recent weight change  Her free T4 level is now low normal compared to the last reading along with normal T3 TSH is however still suppress   Wt Readings from Last 3 Encounters:  03/27/17 146 lb (66.2 kg)  02/06/17 145 lb 6.4 oz (66 kg)  01/01/17 141 lb 6.4 oz (64.1 kg)    Thyroid levels from labs are:     Lab Results  Component Value Date   FREET4 0.73 03/23/2017   FREET4 1.09 02/04/2017   FREET4 1.55 12/24/2016   T3FREE 3.2 03/23/2017   T3FREE 3.5 02/04/2017   T3FREE 3.0 11/14/2016   TSH 0.02 (L) 03/23/2017   TSH 0.03 (L) 02/04/2017   TSH <0.01 (L) 12/24/2016   Lab Results  Component Value Date   THYROTRECAB 17.77 (H) 08/04/2016   THYROTRECAB 13.86 (H) 03/18/2016        Allergies as of 03/27/2017   No Known Allergies     Medication List       Accurate as of 03/27/17  3:53 PM.  Always use your most recent med list.          Calcium Carb-Cholecalciferol 600-800 MG-UNIT Tabs Take 1 tablet by mouth daily.   fish oil-omega-3 fatty acids 1000 MG capsule Take 1 g by mouth daily.   methimazole 5 MG tablet Commonly known as:  TAPAZOLE Take 1 tablet (5 mg total) by mouth daily.   multivitamin tablet Take 1 tablet by mouth daily.   OVER THE COUNTER MEDICATION Take 1 capsule by mouth daily. Tumeric           Past Medical History:  Diagnosis Date  . Aortic valve sclerosis    last echo 04/2009  . 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 Cataract   . Hyperthyroidism 01/07/2016   Dr Dwyane Dee, positive TSHR Ab  .  Osteopenia   . PONV (postoperative nausea and vomiting)    post-op nausea  . Sinus drainage     Past Surgical History:  Procedure Laterality Date  . BREAST BIOPSY Right 2010   benign   . BREAST EXCISIONAL BIOPSY Right 2010   benign  . CARPAL TUNNEL RELEASE Right 04/25/2013   CARPAL TUNNEL RELEASE;  Surgeon: Tennis Must, MD;  Location: [facility]  . CATARACT EXTRACTION Bilateral 2013  . COLONOSCOPY  2002   rectosigmoid bulge thought 2/2 fibroids  . COLONOSCOPY  2005   polyps, diverticulosis, rpt 7-10 yrs ([facility])  . COLONOSCOPY  08/2015   HP, mild diverticulosis, rpt 5 yrs  ([facility])  . DENTAL SURGERY     dental implants  . ESOPHAGOGASTRODUODENOSCOPY  2005   antral gastropathy, neg H pylori screen ([facility])  . TONSILLECTOMY  1976  . TOTAL ABDOMINAL HYSTERECTOMY W/ BILATERAL SALPINGOOPHORECTOMY  2008   h/o abnormal pap smears    Family History  Problem Relation Age of Onset  . Diabetes Mother        double amputee  . Congestive Heart Failure Mother   . Diabetes Father   . Stroke Sister   . Lupus Sister        X 2  . Diabetes Brother   . Cancer Brother        duodenal, prostate   . Stroke Maternal Grandmother   . Stroke Maternal Grandfather   . Breast cancer Sister 58  . Colon cancer Neg Hx   . Colon  polyps Neg Hx   . Thyroid disease Neg Hx     Social History:  reports that she has never smoked. She has never used smokeless tobacco. She reports that she does not drink alcohol or use drugs.  Allergies: No Known Allergies    Review of Systems  Currently not on any Antihypertensive drugs, blood pressure was high  the first time   Examination:   BP 128/74   Pulse 73   Ht 4' 11.5" (1.511 m)   Wt 146 lb (66.2 kg)   SpO2 97%   BMI 28.99 kg/m      The thyroid is just palpable on the right, at the most one half times normal, left side not palpable Biceps reflexes appear normal Skin appears normal    Assessment/Plan:   Hyperthyroidism,  from Graves' disease   She has had persistent hyperthyroidism after her I-131 treatment done in April Now she is euthyroid with taking methimazole 5 mg daily since June  She is currently symptomatically doing well Thyroid levels are improved with free T4 been progressively lower but now TSH is still low Although she may be starting to get into remission this is not clear as yet For now she will continue 5 mg methimazole but call if she has any unusual fatigue before her next visit in 6 weeks  Also will reassess her thyrotropin receptor antibody also  Total visit time = 15 minutes  Bandy Honaker 03/27/2017, 3:53 PM

## 2017-05-11 ENCOUNTER — Other Ambulatory Visit (INDEPENDENT_AMBULATORY_CARE_PROVIDER_SITE_OTHER): Payer: Federal, State, Local not specified - PPO

## 2017-05-11 DIAGNOSIS — E0590 Thyrotoxicosis, unspecified without thyrotoxic crisis or storm: Secondary | ICD-10-CM | POA: Diagnosis not present

## 2017-05-11 LAB — T4, FREE: Free T4: 0.53 ng/dL — ABNORMAL LOW (ref 0.60–1.60)

## 2017-05-11 LAB — TSH: TSH: 9.37 u[IU]/mL — AB (ref 0.35–4.50)

## 2017-05-12 LAB — THYROTROPIN RECEPTOR AUTOABS: THYROTROPIN RECEPTOR AB: 208.1 IU/L — AB (ref 0.00–1.75)

## 2017-05-13 ENCOUNTER — Encounter: Payer: Self-pay | Admitting: Endocrinology

## 2017-05-13 ENCOUNTER — Ambulatory Visit (INDEPENDENT_AMBULATORY_CARE_PROVIDER_SITE_OTHER): Payer: Federal, State, Local not specified - PPO | Admitting: Endocrinology

## 2017-05-13 VITALS — BP 118/68 | HR 83 | Ht 59.5 in | Wt 150.0 lb

## 2017-05-13 DIAGNOSIS — E039 Hypothyroidism, unspecified: Secondary | ICD-10-CM

## 2017-05-13 MED ORDER — LEVOTHYROXINE SODIUM 25 MCG PO TABS: 25.0000 ug | ORAL_TABLET | Freq: Every day | ORAL | 3 refills | Status: DC

## 2017-05-13 NOTE — Progress Notes (Signed)
Patient ID: Kelsey Yoder, female   DOB: Feb 01, 1946, 71 y.o.   MRN: [id number]                                                                                                               Reason for Appointment:  Hyperthyroidism, follow-up  Referring physician: Danise Mina    History of Present Illness:   Prior to the consultation she had a history for the prior 2-3 months  of palpitations with her heart racing at times especially at night She was also having ankle swelling but no other typical symptoms of hyperthyroidism including weight loss.  Her labs showed that she had a positive result for thyrotropin receptor antibody measuring 13.5  Since she had continued need for antithyroid drugs she was given I-131 treatment on 10/24/16 with 12 mCi, her uptake was 82% Her methimazole was stopped on her visit on 11/14/16 when her free T4 was 0.71  However with her free T4 being upper normal in June this was restarted at 5 mg daily She also had a persistent thyroid enlargement  She has been continued on 5 mg methimazole However in September she forgot her medication when going out for 10 days on a trip and did not start back until 04/06/17 Around that time she started feeling more fatigued than before and about 2 weeks later she stopped taking her methimazole on her own without notifying us  She is now complaining of more fatigue and some sleepiness No cold intolerance, also has gained 4 pounds  Her free T4 level is now low at 0.53 in her TSH is 9.4, previously suppressed  Thyroid levels from labs are:     Lab Results  Component Value Date   FREET4 0.53 (L) 05/11/2017   FREET4 0.73 03/23/2017   FREET4 1.09 02/04/2017   T3FREE 3.2 03/23/2017   T3FREE 3.5 02/04/2017   T3FREE 3.0 11/14/2016   TSH 9.37 (H) 05/11/2017   TSH 0.02 (L) 03/23/2017   TSH 0.03 (L) 02/04/2017   Lab Results  Component Value Date   THYROTRECAB 208.10 (H) 05/11/2017   THYROTRECAB 17.77 (H) 08/04/2016   THYROTRECAB 13.86 (H) 03/18/2016        Allergies as of 05/13/2017   No Known Allergies     Medication List       Accurate as of 05/13/17  1:31 PM. Always use your most recent med list.          Calcium Carb-Cholecalciferol 600-800 MG-UNIT Tabs Take 1 tablet by mouth daily.   fish oil-omega-3 fatty acids 1000 MG capsule Take 1 g by mouth daily.   methimazole 5 MG tablet Commonly known as:  TAPAZOLE Take 1 tablet (5 mg total) by mouth daily.   multivitamin tablet Take 1 tablet by mouth daily.   OVER THE COUNTER MEDICATION Take 1 capsule by mouth daily. Tumeric           Past Medical History:  Diagnosis Date  . Aortic valve sclerosis    last echo 04/2009  . Carpal  tunnel syndrome of right wrist 04/2013  . Cataract   . Hyperthyroidism 01/07/2016   Dr Dwyane Dee, positive TSHR Ab  . Osteopenia   . PONV (postoperative nausea and vomiting)    post-op nausea  . Sinus drainage     Past Surgical History:  Procedure Laterality Date  . BREAST BIOPSY Right 2010   benign   . BREAST EXCISIONAL BIOPSY Right 2010   benign  . CARPAL TUNNEL RELEASE Right 04/25/2013   CARPAL TUNNEL RELEASE;  Surgeon: Tennis Must, MD;  Location: [facility]  . CATARACT EXTRACTION Bilateral 2013  . COLONOSCOPY  2002   rectosigmoid bulge thought 2/2 fibroids  . COLONOSCOPY  2005   polyps, diverticulosis, rpt 7-10 yrs ([facility])  . COLONOSCOPY  08/2015   HP, mild diverticulosis, rpt 5 yrs  ([facility])  . DENTAL SURGERY     dental implants  . ESOPHAGOGASTRODUODENOSCOPY  2005   antral gastropathy, neg H pylori screen ([facility])  . TONSILLECTOMY  1976  . TOTAL ABDOMINAL HYSTERECTOMY W/ BILATERAL SALPINGOOPHORECTOMY  2008   h/o abnormal pap smears    Family History  Problem Relation Age of Onset  . Diabetes Mother        double amputee  . Congestive Heart Failure Mother   . Diabetes Father   . Stroke Sister   . Lupus Sister        X 2  . Diabetes Brother   . Cancer  Brother        duodenal, prostate   . Stroke Maternal Grandmother   . Stroke Maternal Grandfather   . Breast cancer Sister 76  . Colon cancer Neg Hx   . Colon polyps Neg Hx   . Thyroid disease Neg Hx     Social History:  reports that she has never smoked. She has never used smokeless tobacco. She reports that she does not drink alcohol or use drugs.  Allergies: No Known Allergies    Review of Systems     Examination:   BP 118/68   Pulse 83   Ht 4' 11.5" (1.511 m)   Wt 150 lb (68 kg)   SpO2 98%   BMI 29.79 kg/m      The thyroid is enlarged one half times normal on the right, left side just palpable Biceps reflexes appear to show slow relaxation  Skin appears normal No edema    Assessment/Plan:   Hyperthyroidism,  from Graves' disease   She had persistent hyperthyroidism after her I-131 treatment done in April Now she is now starting to become hypothyroid She has had fatigue for the last month  She has not been on methimazole for about 2 weeks now and her labs show hypothyroidism This is despite her having some persistent thyroid enlargement Cannot explain her high thyrotropin receptor antibody  Discussed with the patient that she most likely is becoming hypothyroid finally in a delayed manner after I-131 treatment Since her TSH is only mildly increased and will start her only on 25 g of levothyroxine for now and follow-up in 1 month  Total visit time = 15 minutes  Demetrie Borge 05/13/2017, 1:31 PM

## 2017-05-18 ENCOUNTER — Ambulatory Visit: Payer: Federal, State, Local not specified - PPO | Admitting: Endocrinology

## 2017-06-14 ENCOUNTER — Other Ambulatory Visit: Payer: Self-pay | Admitting: Family Medicine

## 2017-06-14 DIAGNOSIS — E785 Hyperlipidemia, unspecified: Secondary | ICD-10-CM

## 2017-06-14 DIAGNOSIS — R748 Abnormal levels of other serum enzymes: Secondary | ICD-10-CM

## 2017-06-14 DIAGNOSIS — E0590 Thyrotoxicosis, unspecified without thyrotoxic crisis or storm: Secondary | ICD-10-CM

## 2017-06-14 DIAGNOSIS — E559 Vitamin D deficiency, unspecified: Secondary | ICD-10-CM

## 2017-06-15 ENCOUNTER — Other Ambulatory Visit (INDEPENDENT_AMBULATORY_CARE_PROVIDER_SITE_OTHER): Payer: Federal, State, Local not specified - PPO

## 2017-06-15 DIAGNOSIS — R748 Abnormal levels of other serum enzymes: Secondary | ICD-10-CM

## 2017-06-15 DIAGNOSIS — E039 Hypothyroidism, unspecified: Secondary | ICD-10-CM | POA: Diagnosis not present

## 2017-06-15 DIAGNOSIS — E785 Hyperlipidemia, unspecified: Secondary | ICD-10-CM

## 2017-06-15 DIAGNOSIS — E559 Vitamin D deficiency, unspecified: Secondary | ICD-10-CM

## 2017-06-15 LAB — T4, FREE: Free T4: 0.75 ng/dL (ref 0.60–1.60)

## 2017-06-15 LAB — COMPREHENSIVE METABOLIC PANEL
ALT: 15 U/L (ref 0–35)
AST: 18 U/L (ref 0–37)
Albumin: 4.2 g/dL (ref 3.5–5.2)
Alkaline Phosphatase: 99 U/L (ref 39–117)
BILIRUBIN TOTAL: 0.4 mg/dL (ref 0.2–1.2)
BUN: 18 mg/dL (ref 6–23)
CALCIUM: 9.8 mg/dL (ref 8.4–10.5)
CO2: 26 mEq/L (ref 19–32)
CREATININE: 0.96 mg/dL (ref 0.40–1.20)
Chloride: 108 mEq/L (ref 96–112)
GFR: 74.06 mL/min (ref >60.00)
Glucose, Bld: 91 mg/dL (ref 70–99)
Potassium: 4.2 mEq/L (ref 3.5–5.1)
Sodium: 142 mEq/L (ref 135–145)
TOTAL PROTEIN: 7.4 g/dL (ref 6.0–8.3)

## 2017-06-15 LAB — GAMMA GT: GGT: 31 U/L (ref 7–51)

## 2017-06-15 LAB — LIPID PANEL
Cholesterol: 240 mg/dL — ABNORMAL HIGH (ref 0–200)
HDL: 53.1 mg/dL (ref >39.00)
LDL Cholesterol: 169 mg/dL — ABNORMAL HIGH (ref 0–99)
NonHDL: 186.57
TRIGLYCERIDES: 89 mg/dL (ref 0.0–149.0)
Total CHOL/HDL Ratio: 5
VLDL: 17.8 mg/dL (ref 0.0–40.0)

## 2017-06-15 LAB — TSH: TSH: 5.46 u[IU]/mL — AB (ref 0.35–4.50)

## 2017-06-15 LAB — VITAMIN D 25 HYDROXY (VIT D DEFICIENCY, FRACTURES): VITD: 33.31 ng/mL (ref 30.00–100.00)

## 2017-06-16 LAB — ALKALINE PHOSPHATASE: ALKALINE PHOSPHATASE (APISO): 103 U/L (ref 33–130)

## 2017-06-17 ENCOUNTER — Ambulatory Visit: Payer: Federal, State, Local not specified - PPO | Admitting: Endocrinology

## 2017-06-17 ENCOUNTER — Encounter: Payer: Self-pay | Admitting: Family Medicine

## 2017-06-17 ENCOUNTER — Encounter: Payer: Self-pay | Admitting: Endocrinology

## 2017-06-17 ENCOUNTER — Ambulatory Visit (INDEPENDENT_AMBULATORY_CARE_PROVIDER_SITE_OTHER): Payer: Federal, State, Local not specified - PPO | Admitting: Family Medicine

## 2017-06-17 VITALS — BP 136/72 | HR 78 | Temp 97.9°F | Ht 59.5 in | Wt 150.8 lb

## 2017-06-17 VITALS — BP 126/78 | HR 69 | Ht 59.5 in | Wt 152.0 lb

## 2017-06-17 DIAGNOSIS — E0590 Thyrotoxicosis, unspecified without thyrotoxic crisis or storm: Secondary | ICD-10-CM

## 2017-06-17 DIAGNOSIS — M85852 Other specified disorders of bone density and structure, left thigh: Secondary | ICD-10-CM | POA: Diagnosis not present

## 2017-06-17 DIAGNOSIS — E559 Vitamin D deficiency, unspecified: Secondary | ICD-10-CM

## 2017-06-17 DIAGNOSIS — Z0000 Encounter for general adult medical examination without abnormal findings: Secondary | ICD-10-CM

## 2017-06-17 DIAGNOSIS — E039 Hypothyroidism, unspecified: Secondary | ICD-10-CM | POA: Diagnosis not present

## 2017-06-17 DIAGNOSIS — R748 Abnormal levels of other serum enzymes: Secondary | ICD-10-CM | POA: Diagnosis not present

## 2017-06-17 DIAGNOSIS — E785 Hyperlipidemia, unspecified: Secondary | ICD-10-CM | POA: Diagnosis not present

## 2017-06-17 DIAGNOSIS — Z23 Encounter for immunization: Secondary | ICD-10-CM | POA: Diagnosis not present

## 2017-06-17 MED ORDER — LEVOTHYROXINE SODIUM 25 MCG PO TABS: ORAL_TABLET | ORAL | 3 refills | Status: DC

## 2017-06-17 NOTE — Assessment & Plan Note (Signed)
Continue calcium, vit D and weight bearing exercise.

## 2017-06-17 NOTE — Assessment & Plan Note (Signed)
She has not been regular with vit D. Encouraged compliance.

## 2017-06-17 NOTE — Assessment & Plan Note (Signed)
Appreciate endo care.  

## 2017-06-17 NOTE — Assessment & Plan Note (Signed)
Preventative protocols reviewed and updated unless pt declined. Discussed healthy diet and lifestyle.  

## 2017-06-17 NOTE — Assessment & Plan Note (Signed)
Back to normal 

## 2017-06-17 NOTE — Progress Notes (Signed)
BP 136/72 (BP Location: Left Arm, Patient Position: Sitting, Cuff Size: Normal)   Pulse 78   Temp 97.9 F (36.6 C) (Oral)   Ht 4' 11.5" (1.511 m)   Wt 150 lb 12 oz (68.4 kg)   SpO2 97%   BMI 29.94 kg/m    CC: CPE Subjective:    Patient ID: Kelsey Yoder, female    DOB: [birth year], 71 y.o.   MRN: [id number]  HPI: Kelsey Yoder is a 71 y.o. female presenting on 06/17/2017 for Annual Exam   Graves disease with hyperthyroidism now becoming hypothyroid - sees Dr Dwyane Dee. She has f/u scheduled for today.   Preventative: COLONOSCOPY 08/2015; HP, mild diverticulosis, rpt 5 yrs ([facility]) Well woman - OBGYN 10/2016 (Dr. Harolyn Rutherford).planned f/u Q2 yrs. S/p hysterectomy 2008 for h/o abnormal paps.  Mammogram 11/2016 WNL DEXA - 11/2016 - osteopenia, T -1.8 at hip. Regular with calcium and vit D. Limited walking.  Flu shot today Tetanus shot - done 2010. Pneumovax 2014. prevnar 05/2014 zostavax 2012 shingrix - discussed Advanced directive: living will in chart 06/2014 - no prolonged life support if terminal condition. Would want HCPOA to be sister [name]. HCPOA form provided today.  Seat belt use discussed Sunscreen use discussed. No changing moles on skin Non smokers  Alcohol - none   Caffeine: occasional Lives alone, no pets Occupation: retired 2012, was in [employer] forces operational officer Edu: master's Activity: goes to gym 3-4x/wk (water aerobics and weight lifting) Diet: some water, fruits and vegetables daily   Relevant past medical, surgical, family and social history reviewed and updated as indicated. Interim medical history since our last visit reviewed. Allergies and medications reviewed and updated. Outpatient Medications Prior to Visit  Medication Sig Dispense Refill  . Calcium Carb-Cholecalciferol 600-800 MG-UNIT TABS Take 1 tablet by mouth daily.    . fish oil-omega-3 fatty acids 1000 MG capsule Take 1 g by mouth daily.    Marland Kitchen levothyroxine (SYNTHROID) 25 MCG tablet  Take 1 tablet (25 mcg total) by mouth daily before breakfast. 30 tablet 3  . Multiple Vitamin (MULTIVITAMIN) tablet Take 1 tablet by mouth daily.    Marland Kitchen OVER THE COUNTER MEDICATION Take 1 capsule by mouth daily. Tumeric     No facility-administered medications prior to visit.      Per HPI unless specifically indicated in ROS section below Review of Systems  Constitutional: Negative for activity change, appetite change, chills, fatigue, fever and unexpected weight change.  HENT: Negative for hearing loss.   Eyes: Negative for visual disturbance.  Respiratory: Negative for cough, chest tightness, shortness of breath and wheezing.   Cardiovascular: Negative for chest pain, palpitations and leg swelling.  Gastrointestinal: Negative for abdominal distention, abdominal pain, blood in stool, constipation, diarrhea, nausea and vomiting.  Genitourinary: Negative for difficulty urinating and hematuria.  Musculoskeletal: Negative for arthralgias, myalgias and neck pain.  Skin: Negative for rash.  Neurological: Negative for dizziness, seizures, syncope and headaches.  Hematological: Negative for adenopathy. Does not bruise/bleed easily.  Psychiatric/Behavioral: Negative for dysphoric mood. The patient is not nervous/anxious.        Objective:    BP 136/72 (BP Location: Left Arm, Patient Position: Sitting, Cuff Size: Normal)   Pulse 78   Temp 97.9 F (36.6 C) (Oral)   Ht 4' 11.5" (1.511 m)   Wt 150 lb 12 oz (68.4 kg)   SpO2 97%   BMI 29.94 kg/m   Wt Readings from Last 3 Encounters:  06/17/17 150 lb 12 oz (68.4 kg)  05/13/17 150 lb (68 kg)  03/27/17 146 lb (66.2 kg)    Physical Exam  Constitutional: She is oriented to person, place, and time. She appears well-developed and well-nourished. No distress.  HENT:  Head: Normocephalic and atraumatic.  Right Ear: Hearing, tympanic membrane, external ear and ear canal normal.  Left Ear: Hearing, tympanic membrane, external ear and ear canal  normal.  Nose: Nose normal.  Mouth/Throat: Uvula is midline, oropharynx is clear and moist and mucous membranes are normal. No oropharyngeal exudate, posterior oropharyngeal edema or posterior oropharyngeal erythema.  Eyes: Conjunctivae and EOM are normal. Pupils are equal, round, and reactive to light. No scleral icterus.  Neck: Normal range of motion. Neck supple. Carotid bruit is not present. No thyromegaly present.  Cardiovascular: Normal rate, regular rhythm, normal heart sounds and intact distal pulses.  No murmur heard. Pulses:      Radial pulses are 2+ on the right side, and 2+ on the left side.  Pulmonary/Chest: Effort normal and breath sounds normal. No respiratory distress. She has no wheezes. She has no rales.  Abdominal: Soft. Bowel sounds are normal. She exhibits no distension and no mass. There is no tenderness. There is no rebound and no guarding.  Musculoskeletal: Normal range of motion. She exhibits no edema.  Lymphadenopathy:    She has no cervical adenopathy.  Neurological: She is alert and oriented to person, place, and time.  CN grossly intact, station and gait intact  Skin: Skin is warm and dry. No rash noted.  Psychiatric: She has a normal mood and affect. Her behavior is normal. Judgment and thought content normal.  Nursing note and vitals reviewed.  Results for orders placed or performed in visit on 06/15/17  Gamma GT  Result Value Ref Range   GGT 31 7 - 51 U/L  VITAMIN D 25 Hydroxy (Vit-D Deficiency, Fractures)  Result Value Ref Range   VITD 33.31 30.00 - 100.00 ng/mL  Lipid panel  Result Value Ref Range   Cholesterol 240 (H) 0 - 200 mg/dL   Triglycerides 89.0 0.0 - 149.0 mg/dL   HDL 53.10 >39.00 mg/dL   VLDL 17.8 0.0 - 40.0 mg/dL   LDL Cholesterol 169 (H) 0 - 99 mg/dL   Total CHOL/HDL Ratio 5    NonHDL 186.57   Comprehensive metabolic panel  Result Value Ref Range   Sodium 142 135 - 145 mEq/L   Potassium 4.2 3.5 - 5.1 mEq/L   Chloride 108 96 - 112  mEq/L   CO2 26 19 - 32 mEq/L   Glucose, Bld 91 70 - 99 mg/dL   BUN 18 6 - 23 mg/dL   Creatinine, Ser 0.96 0.40 - 1.20 mg/dL   Total Bilirubin 0.4 0.2 - 1.2 mg/dL   Alkaline Phosphatase 99 39 - 117 U/L   AST 18 0 - 37 U/L   ALT 15 0 - 35 U/L   Total Protein 7.4 6.0 - 8.3 g/dL   Albumin 4.2 3.5 - 5.2 g/dL   Calcium 9.8 8.4 - 10.5 mg/dL   GFR 74.06 >60.00 mL/min  T4, free  Result Value Ref Range   Free T4 0.75 0.60 - 1.60 ng/dL  TSH  Result Value Ref Range   TSH 5.46 (H) 0.35 - 4.50 uIU/mL  Alkaline phosphatase  Result Value Ref Range   Alkaline phosphatase (APISO) 103 33 - 130 U/L      Assessment & Plan:   Problem List Items Addressed This Visit    Elevated alkaline phosphatase level  Back to normal.       Healthcare maintenance - Primary    Preventative protocols reviewed and updated unless pt declined. Discussed healthy diet and lifestyle.       Hyperthyroidism    Appreciate endo care.       Mild hyperlipidemia    Chronic. Worse with weight gain noted over the past year. Encouraged low chol diet as well as goal weight loss.  The 10-year ASCVD risk score Mikey Bussing DC Brooke Bonito., et al., 2013) is: 14.3%   Values used to calculate the score:     Age: 23 years     Sex: Female     Is Non-Hispanic African American: Yes     Diabetic: No     Tobacco smoker: No     Systolic Blood Pressure: 349 mmHg     Is BP treated: No     HDL Cholesterol: 53.1 mg/dL     Total Cholesterol: 240 mg/dL       Osteopenia of neck of left femur    Continue calcium, vit D and weight bearing exercise.       Vitamin D deficiency    She has not been regular with vit D. Encouraged compliance.        Other Visit Diagnoses    Need for influenza vaccination       Relevant Orders   Flu Vaccine QUAD 6+ mos PF IM (Fluarix Quad PF) (Completed)       Follow up plan: Return in about 1 year (around 06/17/2018) for annual exam, prior fasting for blood work.  Ria Bush, MD

## 2017-06-17 NOTE — Progress Notes (Signed)
Patient ID: Kelsey Yoder, female   DOB: 03/22/1946, 71 y.o.   MRN: [id number]                                                                                                               Reason for Appointment:  Hyperthyroidism, follow-up  Referring physician: Danise Mina    History of Present Illness:   Prior to the consultation she had a history for the prior 2-3 months  of palpitations with her heart racing at times especially at night She was also having ankle swelling but no other typical symptoms of hyperthyroidism including weight loss.  Her labs showed that she had a positive result for thyrotropin receptor antibody measuring 13.5  Since she had continued need for antithyroid drugs she was given I-131 treatment on 10/24/16 with 12 mCi, her uptake was 82% Her methimazole was stopped on her visit on 11/14/16 when her free T4 was 0.71  However with her free T4 being upper normal in June this was restarted at 5 mg daily She also had a persistent thyroid enlargement  She has been continued on 5 mg methimazole However in September she forgot her medication when going out for 10 days on a trip and did not start back until 04/06/17 Around that time she started feeling more fatigued than before and about 2 weeks later she stopped taking her methimazole on her own without notifying us  She is now complaining of more fatigue and some sleepiness No cold intolerance, also has gained 4 pounds  Her free T4 level is now low at 0.53  her TSH is 9.4, previously suppressed  Wt Readings from Last 3 Encounters:  06/17/17 152 lb (68.9 kg)  06/17/17 150 lb 12 oz (68.4 kg)  05/13/17 150 lb (68 kg)    Thyroid levels from labs are:     Lab Results  Component Value Date   FREET4 0.75 06/15/2017   FREET4 0.53 (L) 05/11/2017   FREET4 0.73 03/23/2017   T3FREE 3.2 03/23/2017   T3FREE 3.5 02/04/2017   T3FREE 3.0 11/14/2016   TSH 5.46 (H) 06/15/2017   TSH 9.37 (H) 05/11/2017   TSH 0.02  (L) 03/23/2017   Lab Results  Component Value Date   THYROTRECAB 208.10 (H) 05/11/2017   THYROTRECAB 17.77 (H) 08/04/2016   THYROTRECAB 13.86 (H) 03/18/2016        Allergies as of 06/17/2017   No Known Allergies     Medication List        Accurate as of 06/17/17  3:45 PM. Always use your most recent med list.          Calcium Carb-Cholecalciferol 600-800 MG-UNIT Tabs Take 1 tablet by mouth daily.   fish oil-omega-3 fatty acids 1000 MG capsule Take 1 g by mouth daily.   levothyroxine 25 MCG tablet Commonly known as:  SYNTHROID Take 1 tablet (25 mcg total) by mouth daily before breakfast.   multivitamin tablet Take 1 tablet by mouth daily.   OVER THE COUNTER MEDICATION Take 1 capsule by  mouth daily. Tumeric           Past Medical History:  Diagnosis Date  . Aortic valve sclerosis    last echo 04/2009  . 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 Cataract   . Hyperthyroidism 01/07/2016   Dr Dwyane Dee, positive TSHR Ab  . Osteopenia   . PONV (postoperative nausea and vomiting)    post-op nausea  . Sinus drainage     Past Surgical History:  Procedure Laterality Date  . BREAST BIOPSY Right 2010   benign   . BREAST EXCISIONAL BIOPSY Right 2010   benign  . CARPAL TUNNEL RELEASE Right 04/25/2013   CARPAL TUNNEL RELEASE;  Surgeon: Tennis Must, MD;  Location: [place] [facility]  . CATARACT EXTRACTION Bilateral 2013  . COLONOSCOPY  2002   rectosigmoid bulge thought 2/2 fibroids  . COLONOSCOPY  2005   polyps, diverticulosis, rpt 7-10 yrs ([facility])  . COLONOSCOPY  08/2015   HP, mild diverticulosis, rpt 5 yrs  ([facility])  . DENTAL SURGERY     dental implants  . ESOPHAGOGASTRODUODENOSCOPY  2005   antral gastropathy, neg H pylori screen ([facility])  . TONSILLECTOMY  1976  . TOTAL ABDOMINAL HYSTERECTOMY W/ BILATERAL SALPINGOOPHORECTOMY  2008   h/o abnormal pap smears    Family History  Problem Relation Age of Onset  . Diabetes Mother         double amputee  . Congestive Heart Failure Mother   . Diabetes Father   . Stroke Sister   . Lupus Sister        X 2  . Diabetes Brother   . Cancer Brother        duodenal, prostate   . Stroke Maternal Grandmother   . Stroke Maternal Grandfather   . Breast cancer Sister 25  . Colon cancer Neg Hx   . Colon polyps Neg Hx   . Thyroid disease Neg Hx     Social History:  reports that  has never smoked. she has never used smokeless tobacco. She reports that she does not drink alcohol or use drugs.  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

## 2017-06-17 NOTE — Assessment & Plan Note (Signed)
Chronic. Worse with weight gain noted over the past year. Encouraged low chol diet as well as goal weight loss.  The 10-year ASCVD risk score Mikey Bussing DC Brooke Bonito., et al., 2013) is: 14.3%   Values used to calculate the score:     Age: 71 years     Sex: Female     Is Non-Hispanic African American: Yes     Diabetic: No     Tobacco smoker: No     Systolic Blood Pressure: 147 mmHg     Is BP treated: No     HDL Cholesterol: 53.1 mg/dL     Total Cholesterol: 240 mg/dL

## 2017-06-17 NOTE — Patient Instructions (Addendum)
If interested, check with pharmacy about new 2 shot shingles series (shingrix).  Increase water intake.  You are doing well today. Return as needed or in 1 year for next physical.  Health Maintenance, Female Adopting a healthy lifestyle and getting preventive care can go a long way to promote health and wellness. Talk with your health care provider about what schedule of regular examinations is right for you. This is a good chance for you to check in with your provider about disease prevention and staying healthy. In between checkups, there are plenty of things you can do on your own. Experts have done a lot of research about which lifestyle changes and preventive measures are most likely to keep you healthy. Ask your health care provider for more information. Weight and diet Eat a healthy diet  Be sure to include plenty of vegetables, fruits, low-fat dairy products, and lean protein.  Do not eat a lot of foods high in solid fats, added sugars, or salt.  Get regular exercise. This is one of the most important things you can do for your health. ? Most adults should exercise for at least 150 minutes each week. The exercise should increase your heart rate and make you sweat (moderate-intensity exercise). ? Most adults should also do strengthening exercises at least twice a week. This is in addition to the moderate-intensity exercise.  Maintain a healthy weight  Body mass index (BMI) is a measurement that can be used to identify possible weight problems. It estimates body fat based on height and weight. Your health care provider can help determine your BMI and help you achieve or maintain a healthy weight.  For females 78 years of age and older: ? A BMI below 18.5 is considered underweight. ? A BMI of 18.5 to 24.9 is normal. ? A BMI of 25 to 29.9 is considered overweight. ? A BMI of 30 and above is considered obese.  Watch levels of cholesterol and blood lipids  You should start having your  blood tested for lipids and cholesterol at 69 years of age, then have this test every 5 years.  You may need to have your cholesterol levels checked more often if: ? Your lipid or cholesterol levels are high. ? You are older than 69 years of age. ? You are at high risk for heart disease.  Cancer screening Lung Cancer  Lung cancer screening is recommended for adults 27-49 years old who are at high risk for lung cancer because of a history of smoking.  A yearly low-dose CT scan of the lungs is recommended for people who: ? Currently smoke. ? Have quit within the past 15 years. ? Have at least a 30-pack-year history of smoking. A pack year is smoking an average of one pack of cigarettes a day for 1 year.  Yearly screening should continue until it has been 15 years since you quit.  Yearly screening should stop if you develop a health problem that would prevent you from having lung cancer treatment.  Breast Cancer  Practice breast self-awareness. This means understanding how your breasts normally appear and feel.  It also means doing regular breast self-exams. Let your health care provider know about any changes, no matter how small.  If you are in your 20s or 30s, you should have a clinical breast exam (CBE) by a health care provider every 1-3 years as part of a regular health exam.  If you are 44 or older, have a CBE every year. Also consider  having a breast X-ray (mammogram) every year.  If you have a family history of breast cancer, talk to your health care provider about genetic screening.  If you are at high risk for breast cancer, talk to your health care provider about having an MRI and a mammogram every year.  Breast cancer gene (BRCA) assessment is recommended for women who have family members with BRCA-related cancers. BRCA-related cancers include: ? Breast. ? Ovarian. ? Tubal. ? Peritoneal cancers.  Results of the assessment will determine the need for genetic  counseling and BRCA1 and BRCA2 testing.  Cervical Cancer Your health care provider may recommend that you be screened regularly for cancer of the pelvic organs (ovaries, uterus, and vagina). This screening involves a pelvic examination, including checking for microscopic changes to the surface of your cervix (Pap test). You may be encouraged to have this screening done every 3 years, beginning at age 91.  For women ages 52-65, health care providers may recommend pelvic exams and Pap testing every 3 years, or they may recommend the Pap and pelvic exam, combined with testing for human papilloma virus (HPV), every 5 years. Some types of HPV increase your risk of cervical cancer. Testing for HPV may also be done on women of any age with unclear Pap test results.  Other health care providers may not recommend any screening for nonpregnant women who are considered low risk for pelvic cancer and who do not have symptoms. Ask your health care provider if a screening pelvic exam is right for you.  If you have had past treatment for cervical cancer or a condition that could lead to cancer, you need Pap tests and screening for cancer for at least 20 years after your treatment. If Pap tests have been discontinued, your risk factors (such as having a new sexual partner) need to be reassessed to determine if screening should resume. Some women have medical problems that increase the chance of getting cervical cancer. In these cases, your health care provider may recommend more frequent screening and Pap tests.  Colorectal Cancer  This type of cancer can be detected and often prevented.  Routine colorectal cancer screening usually begins at 69 years of age and continues through 69 years of age.  Your health care provider may recommend screening at an earlier age if you have risk factors for colon cancer.  Your health care provider may also recommend using home test kits to check for hidden blood in the  stool.  A small camera at the end of a tube can be used to examine your colon directly (sigmoidoscopy or colonoscopy). This is done to check for the earliest forms of colorectal cancer.  Routine screening usually begins at age 21.  Direct examination of the colon should be repeated every 5-10 years through 69 years of age. However, you may need to be screened more often if early forms of precancerous polyps or small growths are found.  Skin Cancer  Check your skin from head to toe regularly.  Tell your health care provider about any new moles or changes in moles, especially if there is a change in a mole's shape or color.  Also tell your health care provider if you have a mole that is larger than the size of a pencil eraser.  Always use sunscreen. Apply sunscreen liberally and repeatedly throughout the day.  Protect yourself by wearing long sleeves, pants, a wide-brimmed hat, and sunglasses whenever you are outside.  Heart disease, diabetes, and high blood pressure  High blood pressure causes heart disease and increases the risk of stroke. High blood pressure is more likely to develop in: ? People who have blood pressure in the high end of the normal range (130-139/85-89 mm Hg). ? People who are overweight or obese. ? People who are African American.  If you are 14-51 years of age, have your blood pressure checked every 3-5 years. If you are 50 years of age or older, have your blood pressure checked every year. You should have your blood pressure measured twice-once when you are at a hospital or clinic, and once when you are not at a hospital or clinic. Record the average of the two measurements. To check your blood pressure when you are not at a hospital or clinic, you can use: ? An automated blood pressure machine at a pharmacy. ? A home blood pressure monitor.  If you are between 6 years and 19 years old, ask your health care provider if you should take aspirin to prevent  strokes.  Have regular diabetes screenings. This involves taking a blood sample to check your fasting blood sugar level. ? If you are at a normal weight and have a low risk for diabetes, have this test once every three years after 69 years of age. ? If you are overweight and have a high risk for diabetes, consider being tested at a younger age or more often. Preventing infection Hepatitis B  If you have a higher risk for hepatitis B, you should be screened for this virus. You are considered at high risk for hepatitis B if: ? You were born in a country where hepatitis B is common. Ask your health care provider which countries are considered high risk. ? Your parents were born in a high-risk country, and you have not been immunized against hepatitis B (hepatitis B vaccine). ? You have HIV or AIDS. ? You use needles to inject street drugs. ? You live with someone who has hepatitis B. ? You have had sex with someone who has hepatitis B. ? You get hemodialysis treatment. ? You take certain medicines for conditions, including cancer, organ transplantation, and autoimmune conditions.  Hepatitis C  Blood testing is recommended for: ? Everyone born from [birth year] through 1963. ? Anyone with known risk factors for hepatitis C.  Sexually transmitted infections (STIs)  You should be screened for sexually transmitted infections (STIs) including gonorrhea and chlamydia if: ? You are sexually active and are younger than 68 years of age. ? You are older than 69 years of age and your health care provider tells you that you are at risk for this type of infection. ? Your sexual activity has changed since you were last screened and you are at an increased risk for chlamydia or gonorrhea. Ask your health care provider if you are at risk.  If you do not have HIV, but are at risk, it may be recommended that you take a prescription medicine daily to prevent HIV infection. This is called pre-exposure prophylaxis  (PrEP). You are considered at risk if: ? You are sexually active and do not regularly use condoms or know the HIV status of your partner(s). ? You take drugs by injection. ? You are sexually active with a partner who has HIV.  Talk with your health care provider about whether you are at high risk of being infected with HIV. If you choose to begin PrEP, you should first be tested for HIV. You should then be tested every 3 months  for as long as you are taking PrEP. Pregnancy  If you are premenopausal and you may become pregnant, ask your health care provider about preconception counseling.  If you may become pregnant, take 400 to 800 micrograms (mcg) of folic acid every day.  If you want to prevent pregnancy, talk to your health care provider about birth control (contraception). Osteoporosis and menopause  Osteoporosis is a disease in which the bones lose minerals and strength with aging. This can result in serious bone fractures. Your risk for osteoporosis can be identified using a bone density scan.  If you are 8 years of age or older, or if you are at risk for osteoporosis and fractures, ask your health care provider if you should be screened.  Ask your health care provider whether you should take a calcium or vitamin D supplement to lower your risk for osteoporosis.  Menopause may have certain physical symptoms and risks.  Hormone replacement therapy may reduce some of these symptoms and risks. Talk to your health care provider about whether hormone replacement therapy is right for you. Follow these instructions at home:  Schedule regular health, dental, and eye exams.  Stay current with your immunizations.  Do not use any tobacco products including cigarettes, chewing tobacco, or electronic cigarettes.  If you are pregnant, do not drink alcohol.  If you are breastfeeding, limit how much and how often you drink alcohol.  Limit alcohol intake to no more than 1 drink per day for  nonpregnant women. One drink equals 12 ounces of beer, 5 ounces of wine, or 1 ounces of hard liquor.  Do not use street drugs.  Do not share needles.  Ask your health care provider for help if you need support or information about quitting drugs.  Tell your health care provider if you often feel depressed.  Tell your health care provider if you have ever been abused or do not feel safe at home. This information is not intended to replace advice given to you by your health care provider. Make sure you discuss any questions you have with your health care provider. Document Released: 01/13/2011 Document Revised: 12/06/2015 Document Reviewed: 04/03/2015 Elsevier Interactive Patient Education  [employer].

## 2017-06-17 NOTE — Patient Instructions (Signed)
1 1/2 pills of thyroid

## 2017-08-17 ENCOUNTER — Other Ambulatory Visit (INDEPENDENT_AMBULATORY_CARE_PROVIDER_SITE_OTHER): Payer: Federal, State, Local not specified - PPO

## 2017-08-17 DIAGNOSIS — E039 Hypothyroidism, unspecified: Secondary | ICD-10-CM

## 2017-08-17 LAB — TSH: TSH: 1.65 u[IU]/mL (ref 0.35–4.50)

## 2017-08-17 LAB — T4, FREE: Free T4: 1.02 ng/dL (ref 0.60–1.60)

## 2017-08-19 ENCOUNTER — Ambulatory Visit: Payer: Federal, State, Local not specified - PPO | Admitting: Endocrinology

## 2017-08-19 ENCOUNTER — Encounter: Payer: Self-pay | Admitting: Endocrinology

## 2017-08-19 VITALS — BP 122/76 | HR 70 | Ht 59.5 in | Wt 148.6 lb

## 2017-08-19 DIAGNOSIS — E039 Hypothyroidism, unspecified: Secondary | ICD-10-CM | POA: Diagnosis not present

## 2017-08-19 NOTE — Progress Notes (Signed)
Patient ID: Kelsey Yoder, female   DOB: January 07, 1946, 72 y.o.   MRN: [id number]                                                                                                               Reason for Appointment:  Post radioactive iodine hypothyroidism, follow-up  Referring physician: Danise Mina    History of Present Illness:   Prior to the consultation she had a history for the prior 2-3 months  of palpitations with her heart racing at times especially at night She was also having ankle swelling but no other typical symptoms of hyperthyroidism including weight loss.  Her labs showed that she had a positive result for thyrotropin receptor antibody measuring 13.5  Since she had continued need for antithyroid drugs she was given I-131 treatment on 10/24/16 with 12 mCi, her uptake was 82% Her methimazole was stopped on her visit on 11/14/16 when her free T4 was 0.71 However with her free T4 being upper normal in June methimazole was restarted at 5 mg daily Around September she started feeling more fatigued than before  She was found to be hypothyroid with free T4 0.53 and was started on levothyroxine 25 g daily  RECENT history:  She has been continued on levothyroxine supplementation since about 11/18  In 12/18 she was complaining of more fatigue and sleepiness and also some weight gain  With taking 37.5 g of levothyroxine she is feeling less tired and sleepy Her weight is improving She is taking her levothyroxine consistently before breakfast  TSH is now back to normal at 1.65  Wt Readings from Last 3 Encounters:  08/19/17 148 lb 9.6 oz (67.4 kg)  06/17/17 152 lb (68.9 kg)  06/17/17 150 lb 12 oz (68.4 kg)    Thyroid levels from labs are:   Lab Results  Component Value Date   TSH 1.65 08/17/2017   TSH 5.46 (H) 06/15/2017   TSH 9.37 (H) 05/11/2017   FREET4 1.02 08/17/2017   FREET4 0.75 06/15/2017   FREET4 0.53 (L) 05/11/2017          Allergies as of 08/19/2017     No Known Allergies     Medication List        Accurate as of 08/19/17 10:47 AM. Always use your most recent med list.          Calcium Carb-Cholecalciferol 600-800 MG-UNIT Tabs Take 1 tablet by mouth daily.   fish oil-omega-3 fatty acids 1000 MG capsule Take 1 g by mouth daily.   levothyroxine 25 MCG tablet Commonly known as:  SYNTHROID One and a half tablets daily before breakfast   multivitamin tablet Take 1 tablet by mouth daily.   OVER THE COUNTER MEDICATION Take 1 capsule by mouth daily. Tumeric           Past Medical History:  Diagnosis Date  . Aortic valve sclerosis    last echo 04/2009  . 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 Cataract   . Hyperthyroidism 01/07/2016  Dr Dwyane Dee, positive TSHR Ab  . Osteopenia   . PONV (postoperative nausea and vomiting)    post-op nausea  . Sinus drainage     Past Surgical History:  Procedure Laterality Date  . BREAST BIOPSY Right 2010   benign   . BREAST EXCISIONAL BIOPSY Right 2010   benign  . CARPAL TUNNEL RELEASE Right 04/25/2013   CARPAL TUNNEL RELEASE;  Surgeon: Tennis Must, MD;  Location: [facility]  . CATARACT EXTRACTION Bilateral 2013  . COLONOSCOPY  2002   rectosigmoid bulge thought 2/2 fibroids  . COLONOSCOPY  2005   polyps, diverticulosis, rpt 7-10 yrs ([facility])  . COLONOSCOPY  08/2015   HP, mild diverticulosis, rpt 5 yrs  ([facility])  . DENTAL SURGERY     dental implants  . ESOPHAGOGASTRODUODENOSCOPY  2005   antral gastropathy, neg H pylori screen ([facility])  . TONSILLECTOMY  1976  . TOTAL ABDOMINAL HYSTERECTOMY W/ BILATERAL SALPINGOOPHORECTOMY  2008   h/o abnormal pap smears    Family History  Problem Relation Age of Onset  . Diabetes Mother        double amputee  . Congestive Heart Failure Mother   . Diabetes Father   . Stroke Sister   . Lupus Sister        X 2  . Diabetes Brother   . Cancer Brother        duodenal, prostate   . Stroke Maternal Grandmother    . Stroke Maternal Grandfather   . Breast cancer Sister 75  . Colon cancer Neg Hx   . Colon polyps Neg Hx   . Thyroid disease Neg Hx     Social History:  reports that  has never smoked. she has never used smokeless tobacco. She reports that she does not drink alcohol or use drugs.  Allergies: No Known Allergies    Review of Systems     Examination:   BP 122/76 (BP Location: Left Arm, Patient Position: Sitting, Cuff Size: Normal)   Pulse 70   Ht 4' 11.5" (1.511 m)   Wt 148 lb 9.6 oz (67.4 kg)   SpO2 98%   BMI 29.51 kg/m    She looks well  The thyroid is nonpalpable  Biceps reflexes show normal relaxation    Assessment/Plan:  HYPOTHYROIDISM:  She has had mild post ablative hypothyroidism after her I-131 treatment done in April 2018  She has been on thyroid supplements and currently taking only 37.5 g daily  With this she is feeling subjectively better and her thyroid levels are back to normal  She will come back in about 4 months, discussed that she may possibly need a higher dose in the future  [name] 08/19/2017, 10:47 AM

## 2017-09-14 ENCOUNTER — Encounter: Payer: Self-pay | Admitting: Radiology

## 2017-10-28 ENCOUNTER — Other Ambulatory Visit: Payer: Self-pay | Admitting: Endocrinology

## 2017-11-19 ENCOUNTER — Other Ambulatory Visit: Payer: Self-pay | Admitting: Obstetrics & Gynecology

## 2017-11-19 DIAGNOSIS — Z1231 Encounter for screening mammogram for malignant neoplasm of breast: Secondary | ICD-10-CM

## 2017-12-14 ENCOUNTER — Other Ambulatory Visit (INDEPENDENT_AMBULATORY_CARE_PROVIDER_SITE_OTHER): Payer: Federal, State, Local not specified - PPO

## 2017-12-14 DIAGNOSIS — E039 Hypothyroidism, unspecified: Secondary | ICD-10-CM

## 2017-12-14 LAB — T4, FREE: Free T4: 1.04 ng/dL (ref 0.60–1.60)

## 2017-12-14 LAB — TSH: TSH: 3.14 u[IU]/mL (ref 0.35–4.50)

## 2017-12-16 ENCOUNTER — Ambulatory Visit
Admission: RE | Admit: 2017-12-16 | Discharge: 2017-12-16 | Disposition: A | Discharge status: Home / Self Care | Payer: Federal, State, Local not specified - PPO | Source: Ambulatory Visit | Attending: Obstetrics & Gynecology | Admitting: Obstetrics & Gynecology

## 2017-12-16 DIAGNOSIS — Z1231 Encounter for screening mammogram for malignant neoplasm of breast: Secondary | ICD-10-CM

## 2017-12-16 LAB — HM MAMMOGRAPHY

## 2017-12-17 ENCOUNTER — Encounter: Payer: Self-pay | Admitting: Endocrinology

## 2017-12-17 ENCOUNTER — Ambulatory Visit: Payer: Federal, State, Local not specified - PPO | Admitting: Endocrinology

## 2017-12-17 VITALS — BP 122/70 | HR 83 | Ht 59.5 in | Wt 148.8 lb

## 2017-12-17 DIAGNOSIS — E890 Postprocedural hypothyroidism: Secondary | ICD-10-CM | POA: Diagnosis not present

## 2017-12-17 MED ORDER — LEVOTHYROXINE SODIUM 50 MCG PO TABS
50.0000 ug | ORAL_TABLET | Freq: Every day | ORAL | 3 refills | Status: DC
Start: 2017-12-17 — End: 2018-09-23

## 2017-12-17 NOTE — Patient Instructions (Signed)
Take 2 pills daily

## 2017-12-17 NOTE — Progress Notes (Signed)
Patient ID: Kelsey Yoder, female   DOB: [birth year], 72 y.o.   MRN: [id number]                                                                                                               Reason for Appointment:  Post radioactive iodine hypothyroidism, follow-up  Referring physician: Danise Mina    History of Present Illness:   Prior to the consultation she had a history for the prior 2-3 months  of palpitations with her heart racing at times especially at night She was also having ankle swelling but no other typical symptoms of hyperthyroidism including weight loss.  Her labs showed that she had a positive result for thyrotropin receptor antibody measuring 13.5  Since she had continued need for antithyroid drugs she was given I-131 treatment on 10/24/16 with 12 mCi, her uptake was 82% Her methimazole was stopped on her visit on 11/14/16 when her free T4 was 0.71 However with her free T4 being upper normal in June methimazole was restarted at 5 mg daily Around September she started feeling more fatigued than before  She was found to be hypothyroid with free T4 0.53 and was started on levothyroxine 25 g daily  RECENT history:  She has been continued on levothyroxine supplementation since about 11/18  In 12/18 she was complaining of more fatigue and sleepiness and also some weight gain and her dose was increased to 37.5 mcg daily  However now more recently she thinks she is tending to get a little more tired and sleepy again which is This is more after meals Her weight is stable She is taking her levothyroxine consistently before breakfast and her multivitamins and calcium later  TSH is now relatively higher at 3.1 compared to 1.6   Wt Readings from Last 3 Encounters:  12/17/17 148 lb 12.8 oz (67.5 kg)  08/19/17 148 lb 9.6 oz (67.4 kg)  06/17/17 152 lb (68.9 kg)    Thyroid levels from labs are:   Lab Results  Component Value Date   TSH 3.14 12/14/2017   TSH 1.65  08/17/2017   TSH 5.46 (H) 06/15/2017   FREET4 1.04 12/14/2017   FREET4 1.02 08/17/2017   FREET4 0.75 06/15/2017          Allergies as of 12/17/2017   No Known Allergies     Medication List        Accurate as of 12/17/17  1:50 PM. Always use your most recent med list.          Calcium Carb-Cholecalciferol 600-800 MG-UNIT Tabs Take 1 tablet by mouth daily.   fish oil-omega-3 fatty acids 1000 MG capsule Take 1 g by mouth daily.   levothyroxine 25 MCG tablet Commonly known as:  SYNTHROID, LEVOTHROID TAKE 1 & 1/2 (ONE & ONE-HALF) TABLETS BY MOUTH BEFORE BREAKFAST   multivitamin tablet Take 1 tablet by mouth daily.   OVER THE COUNTER MEDICATION Take 1 capsule by mouth daily. Tumeric           Past Medical History:  Diagnosis Date  . Aortic valve sclerosis    last echo 04/2009  . 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 Cataract   . Hyperthyroidism 01/07/2016   Dr Dwyane Dee, positive TSHR Ab  . Osteopenia   . PONV (postoperative nausea and vomiting)    post-op nausea  . Sinus drainage     Past Surgical History:  Procedure Laterality Date  . BREAST BIOPSY Right 2010   benign   . BREAST EXCISIONAL BIOPSY Right 2010   benign  . CARPAL TUNNEL RELEASE Right 04/25/2013   CARPAL TUNNEL RELEASE;  Surgeon: Tennis Must, MD;  Location: [facility]  . CATARACT EXTRACTION Bilateral 2013  . COLONOSCOPY  2002   rectosigmoid bulge thought 2/2 fibroids  . COLONOSCOPY  2005   polyps, diverticulosis, rpt 7-10 yrs ([facility])  . COLONOSCOPY  08/2015   HP, mild diverticulosis, rpt 5 yrs  ([facility])  . DENTAL SURGERY     dental implants  . ESOPHAGOGASTRODUODENOSCOPY  2005   antral gastropathy, neg H pylori screen ([facility])  . TONSILLECTOMY  1976  . TOTAL ABDOMINAL HYSTERECTOMY W/ BILATERAL SALPINGOOPHORECTOMY  2008   h/o abnormal pap smears    Family History  Problem Relation Age of Onset  . Diabetes Mother        double amputee  . Congestive Heart  Failure Mother   . Diabetes Father   . Stroke Sister   . Lupus Sister        X 2  . Diabetes Brother   . Cancer Brother        duodenal, prostate   . Stroke Maternal Grandmother   . Stroke Maternal Grandfather   . Breast cancer Sister 38  . Colon cancer Neg Hx   . Colon polyps Neg Hx   . Thyroid disease Neg Hx     Social History:  reports that she has never smoked. She has never used smokeless tobacco. She reports that she does not drink alcohol or use drugs.  Allergies: No Known Allergies    Review of Systems     Examination:   BP 122/70 (BP Location: Left Arm, Patient Position: Sitting, Cuff Size: Normal)   Pulse 83   Ht 4' 11.5" (1.511 m)   Wt 148 lb 12.8 oz (67.5 kg)   SpO2 98%   BMI 29.55 kg/m    She looks well  The thyroid is nonpalpable  Biceps reflexes show slightly slow relaxation Skin appears normal    Assessment/Plan:  HYPOTHYROIDISM:  She has had mild post ablative hypothyroidism after her I-131 treatment done in April 2018  She has been on relatively low dose of 37.5 mcg levothyroxine  Not clear if she is having symptoms of hypothyroidism with some fatigue and sleepiness Her TSH is notably higher but only about 3 She is compliant with her supplement without any interacting substances  For now we will increase her dosage to 50 mcg and have her come back in 3 months for follow-up again    [name] 12/17/2017, 1:50 PM

## 2017-12-18 ENCOUNTER — Encounter: Payer: Self-pay | Admitting: Family Medicine

## 2018-03-22 ENCOUNTER — Other Ambulatory Visit (INDEPENDENT_AMBULATORY_CARE_PROVIDER_SITE_OTHER): Payer: Federal, State, Local not specified - PPO

## 2018-03-22 DIAGNOSIS — E890 Postprocedural hypothyroidism: Secondary | ICD-10-CM | POA: Diagnosis not present

## 2018-03-22 LAB — TSH: TSH: 2.06 u[IU]/mL (ref 0.35–4.50)

## 2018-03-22 LAB — T4, FREE: Free T4: 0.93 ng/dL (ref 0.60–1.60)

## 2018-03-25 ENCOUNTER — Ambulatory Visit: Payer: Federal, State, Local not specified - PPO | Admitting: Endocrinology

## 2018-03-25 ENCOUNTER — Encounter: Payer: Self-pay | Admitting: Endocrinology

## 2018-03-25 VITALS — BP 118/86 | HR 76 | Ht 59.5 in | Wt 153.0 lb

## 2018-03-25 DIAGNOSIS — E890 Postprocedural hypothyroidism: Secondary | ICD-10-CM

## 2018-03-25 DIAGNOSIS — R5383 Other fatigue: Secondary | ICD-10-CM

## 2018-03-25 NOTE — Progress Notes (Signed)
Patient ID: Kelsey Yoder, female   DOB: 09-Mar-1946, 72 y.o.   MRN: [id number]                                                                                                               Reason for Appointment:  Post radioactive iodine hypothyroidism, follow-up  Referring physician: Danise Mina    History of Present Illness:   Prior to the consultation she had a history for the prior 2-3 months  of palpitations with her heart racing at times especially at night She was also having ankle swelling but no other typical symptoms of hyperthyroidism including weight loss.  Her labs showed that she had a positive result for thyrotropin receptor antibody measuring 13.5  Since she had continued need for antithyroid drugs she was given I-131 treatment on 10/24/16 with 12 mCi, her uptake was 82% Her methimazole was stopped on her visit on 11/14/16 when her free T4 was 0.71 However with her free T4 being upper normal in June methimazole was restarted at 5 mg daily Around September she started feeling more fatigued than before  She was found to be hypothyroid with free T4 0.53 and was started on levothyroxine 25 g daily  RECENT history:  She has been on levothyroxine supplementation since about 11/18  In 12/18 she was complaining of more fatigue and sleepiness and also some weight gain and her dose was increased to 37.5 mcg daily Subsequently in 6/19 she was again complaining of feeling more tired and sleepy without any cold intolerance Even though her TSH was fairly good at 3.1 she was told to empirically increase her dosage to 50 mcg  She does not feel any better and she will tends to get tired and sleepy especially in the mornings after she is trying to exercise Occasionally feels hot also She is also concerned about her 5 pound weight gain  She is taking her levothyroxine consistently before breakfast and her multivitamins and calcium later  TSH is now 2.1     Wt Readings from  Last 3 Encounters:  03/25/18 153 lb (69.4 kg)  12/17/17 148 lb 12.8 oz (67.5 kg)  08/19/17 148 lb 9.6 oz (67.4 kg)    Thyroid levels from recent labs are:   Lab Results  Component Value Date   TSH 2.06 03/22/2018   TSH 3.14 12/14/2017   TSH 1.65 08/17/2017   FREET4 0.93 03/22/2018   FREET4 1.04 12/14/2017   FREET4 1.02 08/17/2017          Allergies as of 03/25/2018   No Known Allergies     Medication List        Accurate as of 03/25/18  1:06 PM. Always use your most recent med list.          Calcium Carb-Cholecalciferol 600-800 MG-UNIT Tabs Take 1 tablet by mouth daily.   fish oil-omega-3 fatty acids 1000 MG capsule Take 1 g by mouth daily.   levothyroxine 50 MCG tablet Commonly known as:  SYNTHROID, LEVOTHROID Take 1 tablet (50 mcg  total) by mouth daily.   multivitamin tablet Take 1 tablet by mouth daily.   OVER THE COUNTER MEDICATION Take 1 capsule by mouth daily. Tumeric           Past Medical History:  Diagnosis Date  . Aortic valve sclerosis    last echo 04/2009  . 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 Cataract   . Hyperthyroidism 01/07/2016   Dr Dwyane Dee, positive TSHR Ab  . Osteopenia   . PONV (postoperative nausea and vomiting)    post-op nausea  . Sinus drainage     Past Surgical History:  Procedure Laterality Date  . BREAST BIOPSY Right 2010   benign   . BREAST EXCISIONAL BIOPSY Right 2010   benign  . CARPAL TUNNEL RELEASE Right 04/25/2013   CARPAL TUNNEL RELEASE;  Surgeon: Tennis Must, MD;  Location: [facility]  . CATARACT EXTRACTION Bilateral 2013  . COLONOSCOPY  2002   rectosigmoid bulge thought 2/2 fibroids  . COLONOSCOPY  2005   polyps, diverticulosis, rpt 7-10 yrs ([facility])  . COLONOSCOPY  08/2015   HP, mild diverticulosis, rpt 5 yrs  ([facility])  . DENTAL SURGERY     dental implants  . ESOPHAGOGASTRODUODENOSCOPY  2005   antral gastropathy, neg H pylori screen ([facility])  . TONSILLECTOMY  1976  .  TOTAL ABDOMINAL HYSTERECTOMY W/ BILATERAL SALPINGOOPHORECTOMY  2008   h/o abnormal pap smears    Family History  Problem Relation Age of Onset  . Diabetes Mother        double amputee  . Congestive Heart Failure Mother   . Diabetes Father   . Stroke Sister   . Lupus Sister        X 2  . Diabetes Brother   . Cancer Brother        duodenal, prostate   . Stroke Maternal Grandmother   . Stroke Maternal Grandfather   . Breast cancer Sister 42  . Colon cancer Neg Hx   . Colon polyps Neg Hx   . Thyroid disease Neg Hx     Social History:  reports that she has never smoked. She has never used smokeless tobacco. She reports that she does not drink alcohol or use drugs.  Allergies: No Known Allergies    Review of Systems  Psychiatric/Behavioral: Positive for insomnia.       Examination:   BP 118/86   Pulse 76   Ht 4' 11.5" (1.511 m)   Wt 153 lb (69.4 kg)   SpO2 96%   BMI 30.39 kg/m   No swelling of the eyes or face  The thyroid is nonpalpable  Biceps reflexes show normal relaxation No peripheral edema    Assessment/Plan:  HYPOTHYROIDISM:  Continued mild post ablative hypothyroidism after her I-131 treatment done in April 2018  She is complaining of fatigue and sleepiness during the day but also has some issues with insomnia With her current dose of 50 mcg of levothyroxine her TSH is still excellent around 2.0  Discussed with her weight gain and somnolence is unlikely to be related to hypothyroidism and she will need to discuss her fatigue with PCP  Thyroid follow-up will be done in 6 months    Kelsey Yoder 03/25/2018, 1:06 PM

## 2018-06-17 ENCOUNTER — Other Ambulatory Visit: Payer: Self-pay | Admitting: Family Medicine

## 2018-06-17 DIAGNOSIS — R748 Abnormal levels of other serum enzymes: Secondary | ICD-10-CM

## 2018-06-17 DIAGNOSIS — E559 Vitamin D deficiency, unspecified: Secondary | ICD-10-CM

## 2018-06-17 DIAGNOSIS — E785 Hyperlipidemia, unspecified: Secondary | ICD-10-CM

## 2018-06-17 DIAGNOSIS — E0590 Thyrotoxicosis, unspecified without thyrotoxic crisis or storm: Secondary | ICD-10-CM

## 2018-06-18 ENCOUNTER — Other Ambulatory Visit (INDEPENDENT_AMBULATORY_CARE_PROVIDER_SITE_OTHER): Payer: Federal, State, Local not specified - PPO

## 2018-06-18 DIAGNOSIS — E785 Hyperlipidemia, unspecified: Secondary | ICD-10-CM

## 2018-06-18 DIAGNOSIS — E559 Vitamin D deficiency, unspecified: Secondary | ICD-10-CM

## 2018-06-18 LAB — COMPREHENSIVE METABOLIC PANEL
ALT: 16 U/L (ref 0–35)
AST: 19 U/L (ref 0–37)
Albumin: 3.9 g/dL (ref 3.5–5.2)
Alkaline Phosphatase: 94 U/L (ref 39–117)
BUN: 15 mg/dL (ref 6–23)
CO2: 29 mEq/L (ref 19–32)
Calcium: 9.6 mg/dL (ref 8.4–10.5)
Chloride: 103 mEq/L (ref 96–112)
Creatinine, Ser: 1.02 mg/dL (ref 0.40–1.20)
GFR: 68.86 mL/min (ref >60.00)
Glucose, Bld: 100 mg/dL — ABNORMAL HIGH (ref 70–99)
Potassium: 4 mEq/L (ref 3.5–5.1)
Sodium: 140 mEq/L (ref 135–145)
TOTAL PROTEIN: 7.3 g/dL (ref 6.0–8.3)
Total Bilirubin: 0.4 mg/dL (ref 0.2–1.2)

## 2018-06-18 LAB — LIPID PANEL
Cholesterol: 226 mg/dL — ABNORMAL HIGH (ref 0–200)
HDL: 49.7 mg/dL (ref >39.00)
LDL Cholesterol: 157 mg/dL — ABNORMAL HIGH (ref 0–99)
NonHDL: 176.73
Total CHOL/HDL Ratio: 5
Triglycerides: 101 mg/dL (ref 0.0–149.0)
VLDL: 20.2 mg/dL (ref 0.0–40.0)

## 2018-06-18 LAB — VITAMIN D 25 HYDROXY (VIT D DEFICIENCY, FRACTURES): VITD: 48.13 ng/mL (ref 30.00–100.00)

## 2018-06-22 NOTE — Assessment & Plan Note (Signed)
Preventative protocols reviewed and updated unless pt declined. Discussed healthy diet and lifestyle.  

## 2018-06-22 NOTE — Progress Notes (Signed)
BP 132/70 (BP Location: Left Arm, Patient Position: Sitting, Cuff Size: Normal)   Pulse 99   Temp 97.8 F (36.6 C) (Oral)   Ht 4' 11.5" (1.511 m)   Wt 154 lb 4 oz (70 kg)   SpO2 94%   BMI 30.63 kg/m    CC: CPE Subjective:    Patient ID: Kelsey Yoder, female    DOB: [birth year], 72 y.o.   MRN: [id number]  HPI: Kelsey Yoder is a 72 y.o. female presenting on 06/23/2018 for Annual Exam   Has regular insurance. Considering medicare.   2 wk h/o sinus congestion, productive cough, pressure headache, slowly getting better on its own. No sick contacts at home. Has tried benadryl, alka seltzer cold, mucinex without benefit.   Preventative: COLONOSCOPY 08/2015;HP, mild diverticulosis, rpt 5 yrs ([facility]) [facility] - OBGYN 10/2016(Dr. Anyanwu).planned f/u Q2 yrs. S/p hysterectomy 2008 for h/o abnormal paps.  Mammogram 12/2017 WNL DEXA - 11/2016 - osteopenia, T -1.8 at hip. Regular with calcium and vit D. Limited walking due to recent illness.  Flu shottoday Tetanus shot - done 2010. Pneumovax 2014. prevnar 05/2014 zostavax 2012 shingrix - discussed Advanced directive:living will in chart 06/2014 - no prolonged life support if terminal condition. Would wantHCPOAto besister [name]. HCPOA form provided previously.  Seat belt use discussed Sunscreen use discussed. 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 Interim medical history since our last visit reviewed. Allergies and medications reviewed and updated. Outpatient Medications Prior to Visit  Medication Sig Dispense  Refill  . Calcium Carb-Cholecalciferol 600-800 MG-UNIT TABS Take 1 tablet by mouth daily.    . Cholecalciferol (VITAMIN D3) 50 MCG (2000 UT) capsule Take 1 capsule by mouth daily.    . fish oil-omega-3 fatty acids 1000 MG capsule Take 1 g by mouth daily.    Marland Kitchen levothyroxine (SYNTHROID, LEVOTHROID) 50 MCG tablet Take 1 tablet (50 mcg total) by mouth daily. 90 tablet 3  . Multiple Vitamin (MULTIVITAMIN) tablet Take 1 tablet by mouth daily.    Marland Kitchen OVER THE COUNTER MEDICATION Take 1 capsule by mouth daily. Tumeric     No facility-administered medications prior to visit.      Per HPI unless specifically indicated in ROS section below Review of Systems  Constitutional: Negative for activity change, appetite change, chills, fatigue, fever and unexpected weight change.  HENT: Positive for congestion and sinus pressure. Negative for hearing loss.   Eyes: Negative for visual disturbance.  Respiratory: Positive for cough. Negative for chest tightness, shortness of breath and wheezing.   Cardiovascular: Negative for chest pain, palpitations and leg swelling.  Gastrointestinal: Negative for abdominal distention, abdominal pain, blood in stool, constipation, diarrhea, nausea and vomiting.  Genitourinary: Negative for difficulty urinating and hematuria.  Musculoskeletal: Negative for arthralgias, myalgias and neck pain.  Skin: Negative for rash.  Neurological: Negative for dizziness, seizures, syncope and headaches.  Hematological: Negative for adenopathy. Does not bruise/bleed easily.  Psychiatric/Behavioral: Negative for dysphoric mood. The patient is not nervous/anxious.        Objective:    BP 132/70 (BP Location: Left Arm, Patient Position: Sitting, Cuff Size: Normal)  Pulse 99   Temp 97.8 F (36.6 C) (Oral)   Ht 4' 11.5" (1.511 m)   Wt 154 lb 4 oz (70 kg)   SpO2 94%   BMI 30.63 kg/m   Wt Readings from Last 3 Encounters:  06/23/18 154 lb 4 oz (70 kg)  03/25/18 153 lb (69.4 kg)  12/17/17  148 lb 12.8 oz (67.5 kg)    Physical Exam  Constitutional: She is oriented to person, place, and time. She appears well-developed and well-nourished. No distress.  HENT:  Head: Normocephalic and atraumatic.  Right Ear: Hearing, tympanic membrane, external ear and ear canal normal.  Left Ear: Hearing, tympanic membrane, external ear and ear canal normal.  Nose: Mucosal edema (nasal mucosal congestion) present. No rhinorrhea. Right sinus exhibits no maxillary sinus tenderness and no frontal sinus tenderness. Left sinus exhibits no maxillary sinus tenderness and no frontal sinus tenderness.  Mouth/Throat: Uvula is midline, oropharynx is clear and moist and mucous membranes are normal. No oropharyngeal exudate, posterior oropharyngeal edema, posterior oropharyngeal erythema or tonsillar abscesses.  Eyes: Pupils are equal, round, and reactive to light. Conjunctivae and EOM are normal. No scleral icterus.  Neck: Normal range of motion. Neck supple.  Cardiovascular: Normal rate, regular rhythm, normal heart sounds and intact distal pulses.  No murmur heard. Pulses:      Radial pulses are 2+ on the right side, and 2+ on the left side.  Pulmonary/Chest: Effort normal and breath sounds normal. No respiratory distress. She has no wheezes. She has no rales.  Abdominal: Soft. Bowel sounds are normal. She exhibits no distension and no mass. There is no tenderness. There is no rebound and no guarding.  Musculoskeletal: Normal range of motion. She exhibits no edema.  Lymphadenopathy:    She has no cervical adenopathy.  Neurological: She is alert and oriented to person, place, and time.  CN grossly intact, station and gait intact  Skin: Skin is warm and dry. No rash noted.  Psychiatric: She has a normal mood and affect. Her behavior is normal. Judgment and thought content normal.  Nursing note and vitals reviewed.  Results for orders placed or performed in visit on 06/18/18  Lipid panel  Result Value Ref  Range   Cholesterol 226 (H) 0 - 200 mg/dL   Triglycerides 101.0 0.0 - 149.0 mg/dL   HDL 49.70 >39.00 mg/dL   VLDL 20.2 0.0 - 40.0 mg/dL   LDL Cholesterol 157 (H) 0 - 99 mg/dL   Total CHOL/HDL Ratio 5    NonHDL 176.73   Comprehensive metabolic panel  Result Value Ref Range   Sodium 140 135 - 145 mEq/L   Potassium 4.0 3.5 - 5.1 mEq/L   Chloride 103 96 - 112 mEq/L   CO2 29 19 - 32 mEq/L   Glucose, Bld 100 (H) 70 - 99 mg/dL   BUN 15 6 - 23 mg/dL   Creatinine, Ser 1.02 0.40 - 1.20 mg/dL   Total Bilirubin 0.4 0.2 - 1.2 mg/dL   Alkaline Phosphatase 94 39 - 117 U/L   AST 19 0 - 37 U/L   ALT 16 0 - 35 U/L   Total Protein 7.3 6.0 - 8.3 g/dL   Albumin 3.9 3.5 - 5.2 g/dL   Calcium 9.6 8.4 - 10.5 mg/dL   GFR 68.86 >60.00 mL/min  VITAMIN D 25 Hydroxy (Vit-D Deficiency, Fractures)  Result Value Ref Range   VITD 48.13 30.00 - 100.00 ng/mL      Assessment & Plan:   Problem List  Items Addressed This Visit    Vitamin D deficiency    Stable period.       Postablative hypothyroidism    Appreciate endo care.       Osteopenia of neck of left femur    Continue calcium, vit D, encouraged regular weight bearing exercise.       Mild hyperlipidemia    Chronic, stable. Discussed benefits of statin. She will consider. She does have fmhx CVA The 10-year ASCVD risk score Mikey Bussing DC Jr., et al., 2013) is: 13.6%   Values used to calculate the score:     Age: 33 years     Sex: Female     Is Non-Hispanic African American: Yes     Diabetic: No     Tobacco smoker: No     Systolic Blood Pressure: 539 mmHg     Is BP treated: No     HDL Cholesterol: 49.7 mg/dL     Total Cholesterol: 226 mg/dL       Encounter for general adult medical examination with abnormal findings - Primary    Preventative protocols reviewed and updated unless pt declined. Discussed healthy diet and lifestyle.       Acute sinusitis    Discussed viral vs bacterial etiology. Prolonged symptoms - provided with WASP for  augmentin with indications when to fill.       Relevant Medications   amoxicillin-clavulanate (AUGMENTIN) 875-125 MG tablet        Meds ordered this encounter  Medications  . amoxicillin-clavulanate (AUGMENTIN) 875-125 MG tablet    Sig: Take 1 tablet by mouth 2 (two) times daily for 10 days.    Dispense:  20 tablet    Refill:  0   No orders of the defined types were placed in this encounter.   Follow up plan: Return in about 1 year (around 06/24/2019) for annual exam, prior fasting for blood work.  Ria Bush, MD

## 2018-06-23 ENCOUNTER — Ambulatory Visit (INDEPENDENT_AMBULATORY_CARE_PROVIDER_SITE_OTHER): Payer: Federal, State, Local not specified - PPO | Admitting: Family Medicine

## 2018-06-23 ENCOUNTER — Encounter: Payer: Self-pay | Admitting: Family Medicine

## 2018-06-23 VITALS — BP 132/70 | HR 99 | Temp 97.8°F | Ht 59.5 in | Wt 154.2 lb

## 2018-06-23 DIAGNOSIS — E559 Vitamin D deficiency, unspecified: Secondary | ICD-10-CM

## 2018-06-23 DIAGNOSIS — M85852 Other specified disorders of bone density and structure, left thigh: Secondary | ICD-10-CM

## 2018-06-23 DIAGNOSIS — J019 Acute sinusitis, unspecified: Secondary | ICD-10-CM

## 2018-06-23 DIAGNOSIS — E890 Postprocedural hypothyroidism: Secondary | ICD-10-CM

## 2018-06-23 DIAGNOSIS — E785 Hyperlipidemia, unspecified: Secondary | ICD-10-CM | POA: Diagnosis not present

## 2018-06-23 DIAGNOSIS — Z0001 Encounter for general adult medical examination with abnormal findings: Secondary | ICD-10-CM | POA: Diagnosis not present

## 2018-06-23 MED ORDER — AMOXICILLIN-POT CLAVULANATE 875-125 MG PO TABS: 1.0000 | ORAL_TABLET | Freq: Two times a day (BID) | ORAL | 0 refills | Status: AC

## 2018-06-23 NOTE — Assessment & Plan Note (Addendum)
Chronic, stable. Discussed benefits of statin. She will consider. She does have fmhx CVA The 10-year ASCVD risk score Mikey Bussing DC Brooke Bonito., et al., 2013) is: 13.6%   Values used to calculate the score:     Age: 72 years     Sex: Female     Is Non-Hispanic African American: Yes     Diabetic: No     Tobacco smoker: No     Systolic Blood Pressure: 080 mmHg     Is BP treated: No     HDL Cholesterol: 49.7 mg/dL     Total Cholesterol: 226 mg/dL

## 2018-06-23 NOTE — Assessment & Plan Note (Signed)
Stable period.  

## 2018-06-23 NOTE — Assessment & Plan Note (Signed)
Continue calcium, vit D, encouraged regular weight bearing exercise.

## 2018-06-23 NOTE — Assessment & Plan Note (Signed)
Discussed viral vs bacterial etiology. Prolonged symptoms - provided with WASP for augmentin with indications when to fill.

## 2018-06-23 NOTE — Assessment & Plan Note (Deleted)
Appreciate endo care.  

## 2018-06-23 NOTE — Assessment & Plan Note (Signed)
Appreciate endo care.  

## 2018-06-23 NOTE — Patient Instructions (Addendum)
I think you have sinus infection, but may still be viral. Next few days will tell. If fever , persistent symptoms or worsening, fill antibiotic provided today.  If interested, check with pharmacy about new 2 shot shingles series (shingrix).  Consider cholesterol medicine to help decrease cardiovascular risk.  You are doing well today.  Return as needed or in 1 year for next physical.  Health Maintenance, Female Adopting a healthy lifestyle and getting preventive care can go a long way to promote health and wellness. Talk with your health care provider about what schedule of regular examinations is right for you. This is a good chance for you to check in with your provider about disease prevention and staying healthy. In between checkups, there are plenty of things you can do on your own. Experts have done a lot of research about which lifestyle changes and preventive measures are most likely to keep you healthy. Ask your health care provider for more information. Weight and diet Eat a healthy diet  Be sure to include plenty of vegetables, fruits, low-fat dairy products, and lean protein.  Do not eat a lot of foods high in solid fats, added sugars, or salt.  Get regular exercise. This is one of the most important things you can do for your health. ? Most adults should exercise for at least 150 minutes each week. The exercise should increase your heart rate and make you sweat (moderate-intensity exercise). ? Most adults should also do strengthening exercises at least twice a week. This is in addition to the moderate-intensity exercise.  Maintain a healthy weight  Body mass index (BMI) is a measurement that can be used to identify possible weight problems. It estimates body fat based on height and weight. Your health care provider can help determine your BMI and help you achieve or maintain a healthy weight.  For females 64 years of age and older: ? A BMI below 18.5 is considered  underweight. ? A BMI of 18.5 to 24.9 is normal. ? A BMI of 25 to 29.9 is considered overweight. ? A BMI of 30 and above is considered obese.  Watch levels of cholesterol and blood lipids  You should start having your blood tested for lipids and cholesterol at 70 years of age, then have this test every 5 years.  You may need to have your cholesterol levels checked more often if: ? Your lipid or cholesterol levels are high. ? You are older than 70 years of age. ? You are at high risk for heart disease.  Cancer screening Lung Cancer  Lung cancer screening is recommended for adults 44-96 years old who are at high risk for lung cancer because of a history of smoking.  A yearly low-dose CT scan of the lungs is recommended for people who: ? Currently smoke. ? Have quit within the past 15 years. ? Have at least a 30-pack-year history of smoking. A pack year is smoking an average of one pack of cigarettes a day for 1 year.  Yearly screening should continue until it has been 15 years since you quit.  Yearly screening should stop if you develop a health problem that would prevent you from having lung cancer treatment.  Breast Cancer  Practice breast self-awareness. This means understanding how your breasts normally appear and feel.  It also means doing regular breast self-exams. Let your health care provider know about any changes, no matter how small.  If you are in your 20s or 30s, you should have  a clinical breast exam (CBE) by a health care provider every 1-3 years as part of a regular health exam.  If you are 21 or older, have a CBE every year. Also consider having a breast X-ray (mammogram) every year.  If you have a family history of breast cancer, talk to your health care provider about genetic screening.  If you are at high risk for breast cancer, talk to your health care provider about having an MRI and a mammogram every year.  Breast cancer gene (BRCA) assessment is  recommended for women who have family members with BRCA-related cancers. BRCA-related cancers include: ? Breast. ? Ovarian. ? Tubal. ? Peritoneal cancers.  Results of the assessment will determine the need for genetic counseling and BRCA1 and BRCA2 testing.  Cervical Cancer Your health care provider may recommend that you be screened regularly for cancer of the pelvic organs (ovaries, uterus, and vagina). This screening involves a pelvic examination, including checking for microscopic changes to the surface of your cervix (Pap test). You may be encouraged to have this screening done every 3 years, beginning at age 16.  For women ages 44-65, health care providers may recommend pelvic exams and Pap testing every 3 years, or they may recommend the Pap and pelvic exam, combined with testing for human papilloma virus (HPV), every 5 years. Some types of HPV increase your risk of cervical cancer. Testing for HPV may also be done on women of any age with unclear Pap test results.  Other health care providers may not recommend any screening for nonpregnant women who are considered low risk for pelvic cancer and who do not have symptoms. Ask your health care provider if a screening pelvic exam is right for you.  If you have had past treatment for cervical cancer or a condition that could lead to cancer, you need Pap tests and screening for cancer for at least 20 years after your treatment. If Pap tests have been discontinued, your risk factors (such as having a new sexual partner) need to be reassessed to determine if screening should resume. Some women have medical problems that increase the chance of getting cervical cancer. In these cases, your health care provider may recommend more frequent screening and Pap tests.  Colorectal Cancer  This type of cancer can be detected and often prevented.  Routine colorectal cancer screening usually begins at 70 years of age and continues through 70 years of  age.  Your health care provider may recommend screening at an earlier age if you have risk factors for colon cancer.  Your health care provider may also recommend using home test kits to check for hidden blood in the stool.  A small camera at the end of a tube can be used to examine your colon directly (sigmoidoscopy or colonoscopy). This is done to check for the earliest forms of colorectal cancer.  Routine screening usually begins at age 55.  Direct examination of the colon should be repeated every 5-10 years through 70 years of age. However, you may need to be screened more often if early forms of precancerous polyps or small growths are found.  Skin Cancer  Check your skin from head to toe regularly.  Tell your health care provider about any new moles or changes in moles, especially if there is a change in a mole's shape or color.  Also tell your health care provider if you have a mole that is larger than the size of a pencil eraser.  Always use  sunscreen. Apply sunscreen liberally and repeatedly throughout the day.  Protect yourself by wearing long sleeves, pants, a wide-brimmed hat, and sunglasses whenever you are outside.  Heart disease, diabetes, and high blood pressure  High blood pressure causes heart disease and increases the risk of stroke. High blood pressure is more likely to develop in: ? People who have blood pressure in the high end of the normal range (130-139/85-89 mm Hg). ? People who are overweight or obese. ? People who are African American.  If you are 53-71 years of age, have your blood pressure checked every 3-5 years. If you are 72 years of age or older, have your blood pressure checked every year. You should have your blood pressure measured twice-once when you are at a hospital or clinic, and once when you are not at a hospital or clinic. Record the average of the two measurements. To check your blood pressure when you are not at a hospital or clinic, you  can use: ? An automated blood pressure machine at a pharmacy. ? A home blood pressure monitor.  If you are between 35 years and 78 years old, ask your health care provider if you should take aspirin to prevent strokes.  Have regular diabetes screenings. This involves taking a blood sample to check your fasting blood sugar level. ? If you are at a normal weight and have a low risk for diabetes, have this test once every three years after 70 years of age. ? If you are overweight and have a high risk for diabetes, consider being tested at a younger age or more often. Preventing infection Hepatitis B  If you have a higher risk for hepatitis B, you should be screened for this virus. You are considered at high risk for hepatitis B if: ? You were born in a country where hepatitis B is common. Ask your health care provider which countries are considered high risk. ? Your parents were born in a high-risk country, and you have not been immunized against hepatitis B (hepatitis B vaccine). ? You have HIV or AIDS. ? You use needles to inject street drugs. ? You live with someone who has hepatitis B. ? You have had sex with someone who has hepatitis B. ? You get hemodialysis treatment. ? You take certain medicines for conditions, including cancer, organ transplantation, and autoimmune conditions.  Hepatitis C  Blood testing is recommended for: ? Everyone born from [birth year] through 1963. ? Anyone with known risk factors for hepatitis C.  Sexually transmitted infections (STIs)  You should be screened for sexually transmitted infections (STIs) including gonorrhea and chlamydia if: ? You are sexually active and are younger than 70 years of age. ? You are older than 70 years of age and your health care provider tells you that you are at risk for this type of infection. ? Your sexual activity has changed since you were last screened and you are at an increased risk for chlamydia or gonorrhea. Ask your  health care provider if you are at risk.  If you do not have HIV, but are at risk, it may be recommended that you take a prescription medicine daily to prevent HIV infection. This is called pre-exposure prophylaxis (PrEP). You are considered at risk if: ? You are sexually active and do not regularly use condoms or know the HIV status of your partner(s). ? You take drugs by injection. ? You are sexually active with a partner who has HIV.  Talk with your health  care provider about whether you are at high risk of being infected with HIV. If you choose to begin PrEP, you should first be tested for HIV. You should then be tested every 3 months for as long as you are taking PrEP. Pregnancy  If you are premenopausal and you may become pregnant, ask your health care provider about preconception counseling.  If you may become pregnant, take 400 to 800 micrograms (mcg) of folic acid every day.  If you want to prevent pregnancy, talk to your health care provider about birth control (contraception). Osteoporosis and menopause  Osteoporosis is a disease in which the bones lose minerals and strength with aging. This can result in serious bone fractures. Your risk for osteoporosis can be identified using a bone density scan.  If you are 81 years of age or older, or if you are at risk for osteoporosis and fractures, ask your health care provider if you should be screened.  Ask your health care provider whether you should take a calcium or vitamin D supplement to lower your risk for osteoporosis.  Menopause may have certain physical symptoms and risks.  Hormone replacement therapy may reduce some of these symptoms and risks. Talk to your health care provider about whether hormone replacement therapy is right for you. Follow these instructions at home:  Schedule regular health, dental, and eye exams.  Stay current with your immunizations.  Do not use any tobacco products including cigarettes, chewing  tobacco, or electronic cigarettes.  If you are pregnant, do not drink alcohol.  If you are breastfeeding, limit how much and how often you drink alcohol.  Limit alcohol intake to no more than 1 drink per day for nonpregnant women. One drink equals 12 ounces of beer, 5 ounces of wine, or 1 ounces of hard liquor.  Do not use street drugs.  Do not share needles.  Ask your health care provider for help if you need support or information about quitting drugs.  Tell your health care provider if you often feel depressed.  Tell your health care provider if you have ever been abused or do not feel safe at home. This information is not intended to replace advice given to you by your health care provider. Make sure you discuss any questions you have with your health care provider. Document Released: 01/13/2011 Document Revised: 12/06/2015 Document Reviewed: 04/03/2015 Elsevier Interactive Patient Education  [employer].

## 2018-08-12 ENCOUNTER — Ambulatory Visit: Payer: Federal, State, Local not specified - PPO | Admitting: Family Medicine

## 2018-08-12 ENCOUNTER — Encounter: Payer: Self-pay | Admitting: Family Medicine

## 2018-08-12 VITALS — BP 126/68 | HR 56 | Temp 98.4°F | Ht 59.5 in | Wt 154.5 lb

## 2018-08-12 DIAGNOSIS — R21 Rash and other nonspecific skin eruption: Secondary | ICD-10-CM | POA: Diagnosis not present

## 2018-08-12 MED ORDER — TRIAMCINOLONE ACETONIDE 0.1 % EX CREA: 1.0000 "application " | TOPICAL_CREAM | Freq: Two times a day (BID) | CUTANEOUS | 0 refills | Status: DC

## 2018-08-12 NOTE — Patient Instructions (Signed)
This may be dry skin dermatitis or inflammation of the skin from dry skin. Treat with triamcinolone steroid cream twice daily for 7-10 days Let us know if not improving with treatment.

## 2018-08-12 NOTE — Assessment & Plan Note (Addendum)
Not consistent with fungal or bacterial cause.  ?dry skin dermatitis - treat with TCI topical cream bid x 7-10 days. Discussed continued moisturizing. Update if not improving with treatment, consider derm eval.

## 2018-08-12 NOTE — Progress Notes (Signed)
BP 126/68 (BP Location: Right Arm, Patient Position: Sitting, Cuff Size: Normal)   Pulse (!) 56   Temp 98.4 F (36.9 C) (Oral)   Ht 4' 11.5" (1.511 m)   Wt 154 lb 8 oz (70.1 kg)   SpO2 95%   BMI 30.68 kg/m    CC: rash Subjective:    Patient ID: Kelsey Yoder, female    DOB: 1946-06-18, 73 y.o.   MRN: [id number]  HPI: Kelsey Yoder is a 73 y.o. female presenting on 08/12/2018 for Rash (C/o rash on lateral right thigh. States area is itchy and has burning sensation. Also, c/o numbness in the area when lying in bed. Noticed about 1 mo ago. )   1 mo h/o rash to lateral right thigh - dark hyperpigmented macular rash to lateral R leg. Over last 2 weeks, noticing numbness when laying on right side.   No fevers/chills, other rashes, oral lesions, nausea, joint pain.  No scaling of skin. No blisters.  No new lotions, detergents, soap or shampoos.  No new medicines, vitamins, supplements.   She has been treating with lotion and benadryl cream.     Relevant past medical, surgical, family and social history reviewed and updated as indicated. Interim medical history since our last visit reviewed. Allergies and medications reviewed and updated. Outpatient Medications Prior to Visit  Medication Sig Dispense Refill  . Calcium Carb-Cholecalciferol 600-800 MG-UNIT TABS Take 1 tablet by mouth daily.    . Cholecalciferol (VITAMIN D3) 50 MCG (2000 UT) capsule Take 1 capsule by mouth daily.    . fish oil-omega-3 fatty acids 1000 MG capsule Take 1 g by mouth daily.    Marland Kitchen levothyroxine (SYNTHROID, LEVOTHROID) 50 MCG tablet Take 1 tablet (50 mcg total) by mouth daily. 90 tablet 3  . Multiple Vitamin (MULTIVITAMIN) tablet Take 1 tablet by mouth daily.    Marland Kitchen OVER THE COUNTER MEDICATION Take 1 capsule by mouth daily. Tumeric     No facility-administered medications prior to visit.      Per HPI unless specifically indicated in ROS section below Review of Systems Objective:    BP 126/68  (BP Location: Right Arm, Patient Position: Sitting, Cuff Size: Normal)   Pulse (!) 56   Temp 98.4 F (36.9 C) (Oral)   Ht 4' 11.5" (1.511 m)   Wt 154 lb 8 oz (70.1 kg)   SpO2 95%   BMI 30.68 kg/m   Wt Readings from Last 3 Encounters:  08/12/18 154 lb 8 oz (70.1 kg)  06/23/18 154 lb 4 oz (70 kg)  03/25/18 153 lb (69.4 kg)    Physical Exam Vitals signs and nursing note reviewed.  Constitutional:      Appearance: Normal appearance.  Skin:    General: Skin is warm and dry.     Findings: Rash present. No erythema.     Comments: Slightly hyperpigmenated patch of macular rash on lateral right thigh, pruritic.   Neurological:     Mental Status: She is alert.       Assessment & Plan:   Problem List Items Addressed This Visit    Skin rash - Primary    Not consistent with fungal or bacterial cause.  ?dry skin dermatitis - treat with TCI topical cream bid x 7-10 days. Discussed continued moisturizing. Update if not improving with treatment, consider derm eval.           Meds ordered this encounter  Medications  . triamcinolone cream (KENALOG) 0.1 %  Sig: Apply 1 application topically 2 (two) times daily. Apply to AA.    Dispense:  30 g    Refill:  0   No orders of the defined types were placed in this encounter.   Follow up plan: Return if symptoms worsen or fail to improve.  Ria Bush, MD

## 2018-08-19 ENCOUNTER — Telehealth: Payer: Self-pay | Admitting: Endocrinology

## 2018-08-19 NOTE — Telephone Encounter (Signed)
Patient has informed that her primary care office is stating they are no orders in the system for her to get labs.  Please Advise, Thanks

## 2018-08-19 NOTE — Telephone Encounter (Signed)
Lab orders are present for patient's thyroid levels.

## 2018-08-20 ENCOUNTER — Encounter: Payer: Self-pay | Admitting: Primary Care

## 2018-08-20 ENCOUNTER — Ambulatory Visit: Payer: Federal, State, Local not specified - PPO | Admitting: Primary Care

## 2018-08-20 VITALS — BP 124/82 | HR 67 | Temp 98.1°F | Ht 59.5 in | Wt 153.5 lb

## 2018-08-20 DIAGNOSIS — M25561 Pain in right knee: Secondary | ICD-10-CM | POA: Diagnosis not present

## 2018-08-20 NOTE — Progress Notes (Signed)
Subjective:    Patient ID: Kelsey Yoder, female    DOB: 1946/04/06, 73 y.o.   MRN: [id number]  HPI  Kelsey Yoder is a 73 year old female with a history of osteopenia, vitamin D deficiency, hypothyroidism who presents today with a chief complaint of lower extremity pain.  Her pain is located to the right anterior knee and soreness to the posterior calfs. She was walking down stairs in her house missed a stair and stumbled down two stairs without falling. Her pain began two days ago just after missing the two stairs. She was initially able to put weight onto her knee just after the incidence.   She denies falling, numbness/tinlging, swelling, weakness. She applied ice to her knee, has not taken anything for her symptoms. Today she's feeling slightly better, able to walk without limping much.   Review of Systems  Musculoskeletal: Positive for arthralgias. Negative for joint swelling.  Skin: Negative for color change.  Neurological: Negative for weakness and numbness.       Past Medical History:  Diagnosis Date  . Aortic valve sclerosis    last echo 04/2009  . 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 Cataract   . Hyperthyroidism 01/07/2016   Dr Dwyane Dee, positive TSHR Ab  . Osteopenia   . PONV (postoperative nausea and vomiting)    post-op nausea  . Sinus drainage      Social History   Socioeconomic History  . Marital status: Married    Spouse name: Not on file  . Number of children: Not on file  . Years of education: Not on file  . Highest education level: Not on file  Occupational History  . Not on file  Social Needs  . Financial resource strain: Not on file  . Food insecurity:    Worry: Not on file    Inability: Not on file  . Transportation needs:    Medical: Not on file    Non-medical: Not on file  Tobacco Use  . Smoking status: Never Smoker  . Smokeless tobacco: Never Used  Substance and Sexual Activity  . Alcohol use: No    Alcohol/week: 0.0  standard drinks  . Drug use: No  . Sexual activity: Yes    Partners: Male    Birth control/protection: Surgical  Lifestyle  . Physical activity:    Days per week: Not on file    Minutes per session: Not on file  . Stress: Not on file  Relationships  . Social connections:    Talks on phone: Not on file    Gets together: Not on file    Attends religious service: Not on file    Active member of club or organization: Not on file    Attends meetings of clubs or organizations: Not on file    Relationship status: Not on file  . Intimate partner violence:    Fear of current or ex partner: Not on file    Emotionally abused: Not on file    Physically abused: Not on file    Forced sexual activity: Not on file  Other Topics Concern  . Not on file  Social History Narrative   Caffeine: occasional   Lives alone, no pets   Occupation: retired, was in Armed forces operational officer   Edu: master's   Activity: goes to gym 3-4x/wk   Diet: some water, fruits and vegetables daily    Past Surgical History:  Procedure Laterality Date  . BREAST BIOPSY Right 2010  benign   . BREAST EXCISIONAL BIOPSY Right 2010   benign  . CARPAL TUNNEL RELEASE Right 04/25/2013   CARPAL TUNNEL RELEASE;  Surgeon: Tennis Must, MD;  Location: [facility]  . CATARACT EXTRACTION Bilateral 2013  . COLONOSCOPY  2002   rectosigmoid bulge thought 2/2 fibroids  . COLONOSCOPY  2005   polyps, diverticulosis, rpt 7-10 yrs ([facility])  . COLONOSCOPY  08/2015   HP, mild diverticulosis, rpt 5 yrs  ([facility])  . DENTAL SURGERY     dental implants  . ESOPHAGOGASTRODUODENOSCOPY  2005   antral gastropathy, neg H pylori screen ([facility])  . TONSILLECTOMY  1976  . TOTAL ABDOMINAL HYSTERECTOMY W/ BILATERAL SALPINGOOPHORECTOMY  2008   h/o abnormal pap smears    Family History  Problem Relation Age of Onset  . Diabetes Mother        double amputee  . Congestive Heart Failure Mother   . Diabetes Father   . Stroke Sister    . Lupus Sister        X 2  . Diabetes Brother   . Cancer Brother        duodenal, prostate   . Stroke Maternal Grandmother   . Stroke Maternal Grandfather   . Breast cancer Sister 64  . Colon cancer Neg Hx   . Colon polyps Neg Hx   . Thyroid disease Neg Hx     No Known Allergies  Current Outpatient Medications on File Prior to Visit  Medication Sig Dispense Refill  . Calcium Carb-Cholecalciferol 600-800 MG-UNIT TABS Take 1 tablet by mouth daily.    . Cholecalciferol (VITAMIN D3) 50 MCG (2000 UT) capsule Take 1 capsule by mouth daily.    . fish oil-omega-3 fatty acids 1000 MG capsule Take 1 g by mouth daily.    Marland Kitchen levothyroxine (SYNTHROID, LEVOTHROID) 50 MCG tablet Take 1 tablet (50 mcg total) by mouth daily. 90 tablet 3  . Multiple Vitamin (MULTIVITAMIN) tablet Take 1 tablet by mouth daily.    Marland Kitchen OVER THE COUNTER MEDICATION Take 1 capsule by mouth daily. Tumeric    . triamcinolone cream (KENALOG) 0.1 % Apply 1 application topically 2 (two) times daily. Apply to AA. 30 g 0   No current facility-administered medications on file prior to visit.     BP 124/82   Pulse 67   Temp 98.1 F (36.7 C) (Oral)   Ht 4' 11.5" (1.511 m)   Wt 153 lb 8 oz (69.6 kg)   SpO2 98%   BMI 30.48 kg/m    Objective:   Physical Exam  Constitutional: She appears well-nourished.  Musculoskeletal:     Right knee: She exhibits normal range of motion, no swelling and no deformity. No tenderness found.       Legs:     Comments: No pain or stiffness with flexion, extension of knee in supine position. 5/5 strength to bilateral lower extremities. No instability or obvious deformity to patella.  Skin: Skin is warm and dry. No erythema.           Assessment & Plan:  Acute Knee Pain:  Since missing two stairs two days ago while walking. No fall. Exam today unremarkable, no pain with ROM activities. Suspect MSK strain and will treat with conservative measures. Discussed use of ice, Tylenol, knee  brace for support.  Return precautions provided.  Pleas Koch, NP

## 2018-08-20 NOTE — Patient Instructions (Signed)
You can take Tylenol 500 mg every 8 hours as needed for pain.  You can were a knee sleeve for support if needed.  Apply ice for any swelling.  Please update [place] if your symptoms become worse.  It was a pleasure to see you today!

## 2018-09-20 ENCOUNTER — Other Ambulatory Visit (INDEPENDENT_AMBULATORY_CARE_PROVIDER_SITE_OTHER): Payer: Federal, State, Local not specified - PPO

## 2018-09-20 DIAGNOSIS — E890 Postprocedural hypothyroidism: Secondary | ICD-10-CM | POA: Diagnosis not present

## 2018-09-20 LAB — T3, FREE: T3, Free: 3 pg/mL (ref 2.3–4.2)

## 2018-09-20 LAB — T4, FREE: Free T4: 0.93 ng/dL (ref 0.60–1.60)

## 2018-09-20 LAB — TSH: TSH: 10.55 u[IU]/mL — ABNORMAL HIGH (ref 0.35–4.50)

## 2018-09-23 ENCOUNTER — Encounter: Payer: Self-pay | Admitting: Family Medicine

## 2018-09-23 ENCOUNTER — Ambulatory Visit: Payer: Federal, State, Local not specified - PPO | Admitting: Endocrinology

## 2018-09-23 ENCOUNTER — Encounter: Payer: Self-pay | Admitting: Endocrinology

## 2018-09-23 ENCOUNTER — Other Ambulatory Visit: Payer: Self-pay

## 2018-09-23 VITALS — BP 122/64 | HR 84 | Ht 59.5 in | Wt 156.2 lb

## 2018-09-23 DIAGNOSIS — E890 Postprocedural hypothyroidism: Secondary | ICD-10-CM | POA: Diagnosis not present

## 2018-09-23 MED ORDER — LEVOTHYROXINE SODIUM 75 MCG PO TABS: 75.0000 ug | ORAL_TABLET | Freq: Every day | ORAL | 3 refills | Status: DC

## 2018-09-23 NOTE — Progress Notes (Signed)
BP 132/82 (BP Location: Left Arm, Patient Position: Sitting, Cuff Size: Normal)   Pulse 77   Temp 97.6 F (36.4 C) (Oral)   Ht 4' 11.5" (1.511 m)   Wt 158 lb 5 oz (71.8 kg)   SpO2 94%   BMI 31.44 kg/m    CC: f/u visit Subjective:    Patient ID: Kelsey Yoder, female    DOB: 09-05-1945, 73 y.o.   MRN: [id number]  HPI: Kelsey Yoder is a 73 y.o. female presenting on 09/24/2018 for Follow-up (Here for f/u of tingling/numbess in right upper leg. )   See prior note for details.  R lateral thigh skin rash previously treated steroid responsive dermatitis with triamcinolone cream - rash not really bothering her.   She also had noticed paresthesias of lateral thigh - this has worsened. Constant symptoms, associated with burning pain affecting sleep. Doesn't really wear tight leggings. Mild ache R lower back. No shooting pain down legs. R leg feels weak. Occasional paresthesias of fingers/toes.   Alcohol - none No DM hx.  2 wks ago she started vit B12. No change noted yet.      Relevant past medical, surgical, family and social history reviewed and updated as indicated. Interim medical history since our last visit reviewed. Allergies and medications reviewed and updated. Outpatient Medications Prior to Visit  Medication Sig Dispense Refill  . Calcium Carb-Cholecalciferol 600-800 MG-UNIT TABS Take 1 tablet by mouth daily.    . Cholecalciferol (VITAMIN D3) 50 MCG (2000 UT) capsule Take 1 capsule by mouth daily.    . fish oil-omega-3 fatty acids 1000 MG capsule Take 1 g by mouth daily.    Marland Kitchen levothyroxine (SYNTHROID, LEVOTHROID) 75 MCG tablet Take 1 tablet (75 mcg total) by mouth daily. 30 tablet 3  . Multiple Vitamin (MULTIVITAMIN) tablet Take 1 tablet by mouth daily.    Marland Kitchen OVER THE COUNTER MEDICATION Take 1 capsule by mouth daily. Tumeric    . triamcinolone cream (KENALOG) 0.1 % Apply 1 application topically 2 (two) times daily. Apply to AA. 30 g 0   No  facility-administered medications prior to visit.      Per HPI unless specifically indicated in ROS section below Review of Systems Objective:    BP 132/82 (BP Location: Left Arm, Patient Position: Sitting, Cuff Size: Normal)   Pulse 77   Temp 97.6 F (36.4 C) (Oral)   Ht 4' 11.5" (1.511 m)   Wt 158 lb 5 oz (71.8 kg)   SpO2 94%   BMI 31.44 kg/m   Wt Readings from Last 3 Encounters:  09/24/18 158 lb 5 oz (71.8 kg)  09/23/18 156 lb 3.2 oz (70.9 kg)  08/20/18 153 lb 8 oz (69.6 kg)    Physical Exam Vitals signs and nursing note reviewed.  Constitutional:      Appearance: Normal appearance. She is not ill-appearing.  Musculoskeletal:     Comments: 2+ DP bilaterally No pain midline spine No paraspinous mm tenderness Neg SLR bilaterally. No pain with int/ext rotation at hip.  Skin:    General: Skin is warm and dry.     Capillary Refill: Capillary refill takes less than 2 seconds.     Findings: No rash.     Comments: No significant rash present  Neurological:     General: No focal deficit present.     Mental Status: She is alert.     Sensory: Sensation is intact.     Motor: Motor function is intact.  Coordination: Coordination is intact.     Deep Tendon Reflexes:     Reflex Scores:      Patellar reflexes are 2+ on the right side and 2+ on the left side.    Comments: 5/5 strength BLE Sensation intact bilateral lateral thighs to light touch and temperature  Psychiatric:        Mood and Affect: Mood normal.        Behavior: Behavior normal.       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 Recent hypothyroidism could contribute.  Rx gabapentin 300mg  nightly x1 wk with option to increase to BID dosing. Continue oral B12 supplementation.  Check CBC, BPM, and vit b12.  If unrevealing evaluation and not improving, consider referral for NCS.       Relevant Orders   Vitamin B12   CBC with Differential/Platelet   Basic metabolic panel       Meds ordered this encounter  Medications  . vitamin B-12 (CYANOCOBALAMIN) 1000 MCG tablet    Sig: Take 1 tablet (1,000 mcg total) by mouth daily.  Marland Kitchen gabapentin (NEURONTIN) 300 MG capsule    Sig: Take 1 capsule (300 mg total) by mouth 2 (two) times daily. First week take 1 capsule nightly only    Dispense:  60 capsule    Refill:  3   Orders Placed This Encounter  Procedures  . Vitamin B12  . CBC with Differential/Platelet  . Basic metabolic panel    Patient Instructions  You are having nerve type pain but unclear why.  Labs today.  Treat with gabapentin 300mg  at bedtime. If tolerated well, may increase to 300mg  twice daily.  If not improving with this, let me know for neurology evaluation with nerve conduction studies.    Follow up plan: Return if symptoms worsen or fail to improve.  Ria Bush, MD

## 2018-09-23 NOTE — Progress Notes (Signed)
Patient ID: Kelsey Yoder, female   DOB: [birth year], 73 y.o.   MRN: [id number]                                                                                                               Reason for Appointment:  Post radioactive iodine hypothyroidism, follow-up  Referring physician: Danise Mina    History of Present Illness:   Prior to the consultation she had a history for the prior 2-3 months  of palpitations with her heart racing at times especially at night She was also having ankle swelling but no other typical symptoms of hyperthyroidism including weight loss.  Her labs showed that she had a positive result for thyrotropin receptor antibody measuring 13.5  Since she had continued need for antithyroid drugs she was given I-131 treatment on 10/24/16 with 12 mCi, her uptake was 82% Her methimazole was stopped on her visit on 11/14/16 when her free T4 was 0.71 However with her free T4 being upper normal in June methimazole was restarted at 5 mg daily Around September she started feeling more fatigued than before  She was found to be hypothyroid with free T4 0.53 and was started on levothyroxine 25 g daily  RECENT history:  She has been on levothyroxine supplementation since about 11/18  In 12/18 she was complaining of more fatigue and sleepiness and also some weight gain and her dose was increased to 37.5 mcg daily Subsequently in 6/19 she was again complaining of feeling more tired and sleepy without any cold intolerance This was increased at that time to 50 mcg  On her previous visit she was complaining of feeling tired and sleepy but is not complaining as much now She does however tend to have less energy and feels like she is dragging No heat or cold intolerance  Her weight is up 2 to 3 pounds recently  She is taking her levothyroxine consistently before breakfast and her multivitamins and calcium later  TSH is now 10.5 without change in free T4    Wt Readings  from Last 3 Encounters:  09/23/18 156 lb 3.2 oz (70.9 kg)  08/20/18 153 lb 8 oz (69.6 kg)  08/12/18 154 lb 8 oz (70.1 kg)    Thyroid levels from recent labs are:   Lab Results  Component Value Date   TSH 10.55 (H) 09/20/2018   TSH 2.06 03/22/2018   TSH 3.14 12/14/2017   FREET4 0.93 09/20/2018   FREET4 0.93 03/22/2018   FREET4 1.04 12/14/2017          Allergies as of 09/23/2018   No Known Allergies     Medication List       Accurate as of September 23, 2018  1:08 PM. Always use your most recent med list.        Calcium Carb-Cholecalciferol 600-800 MG-UNIT Tabs Take 1 tablet by mouth daily.   fish oil-omega-3 fatty acids 1000 MG capsule Take 1 g by mouth daily.   levothyroxine 50 MCG tablet Commonly known as:  SYNTHROID, LEVOTHROID Take  1 tablet (50 mcg total) by mouth daily.   multivitamin tablet Take 1 tablet by mouth daily.   OVER THE COUNTER MEDICATION Take 1 capsule by mouth daily. Tumeric   triamcinolone cream 0.1 % Commonly known as:  KENALOG Apply 1 application topically 2 (two) times daily. Apply to AA.   Vitamin D3 50 MCG (2000 UT) capsule Take 1 capsule by mouth daily.           Past Medical History:  Diagnosis Date  . Aortic valve sclerosis    last echo 04/2009  . 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 Cataract   . Hyperthyroidism 01/07/2016   Dr Dwyane Dee, positive TSHR Ab  . Osteopenia   . PONV (postoperative nausea and vomiting)    post-op nausea  . Sinus drainage     Past Surgical History:  Procedure Laterality Date  . BREAST BIOPSY Right 2010   benign   . BREAST EXCISIONAL BIOPSY Right 2010   benign  . CARPAL TUNNEL RELEASE Right 04/25/2013   CARPAL TUNNEL RELEASE;  Surgeon: Tennis Must, MD;  Location: [facility]  . CATARACT EXTRACTION Bilateral 2013  . COLONOSCOPY  2002   rectosigmoid bulge thought 2/2 fibroids  . COLONOSCOPY  2005   polyps, diverticulosis, rpt 7-10 yrs ([facility])  . COLONOSCOPY   08/2015   HP, mild diverticulosis, rpt 5 yrs  ([facility])  . DENTAL SURGERY     dental implants  . ESOPHAGOGASTRODUODENOSCOPY  2005   antral gastropathy, neg H pylori screen ([facility])  . TONSILLECTOMY  1976  . TOTAL ABDOMINAL HYSTERECTOMY W/ BILATERAL SALPINGOOPHORECTOMY  2008   h/o abnormal pap smears    Family History  Problem Relation Age of Onset  . Diabetes Mother        double amputee  . Congestive Heart Failure Mother   . Diabetes Father   . Stroke Sister   . Lupus Sister        X 2  . Diabetes Brother   . Cancer Brother        duodenal, prostate   . Stroke Maternal Grandmother   . Stroke Maternal Grandfather   . Breast cancer Sister 8  . Colon cancer Neg Hx   . Colon polyps Neg Hx   . Thyroid disease Neg Hx     Social History:  reports that she has never smoked. She has never used smokeless tobacco. She reports that she does not drink alcohol or use drugs.  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

## 2018-09-24 ENCOUNTER — Ambulatory Visit: Payer: Federal, State, Local not specified - PPO | Admitting: Family Medicine

## 2018-09-24 ENCOUNTER — Other Ambulatory Visit: Payer: Self-pay

## 2018-09-24 ENCOUNTER — Encounter: Payer: Self-pay | Admitting: Family Medicine

## 2018-09-24 VITALS — BP 132/82 | HR 77 | Temp 97.6°F | Ht 59.5 in | Wt 158.3 lb

## 2018-09-24 DIAGNOSIS — G5711 Meralgia paresthetica, right lower limb: Secondary | ICD-10-CM | POA: Insufficient documentation

## 2018-09-24 DIAGNOSIS — R202 Paresthesia of skin: Secondary | ICD-10-CM

## 2018-09-24 LAB — BASIC METABOLIC PANEL WITH GFR
BUN: 12 mg/dL (ref 6–23)
CO2: 27 meq/L (ref 19–32)
Calcium: 9.1 mg/dL (ref 8.4–10.5)
Chloride: 103 meq/L (ref 96–112)
Creatinine, Ser: 1 mg/dL (ref 0.40–1.20)
GFR: 66.23 mL/min (ref >60.00)
Glucose, Bld: 90 mg/dL (ref 70–99)
Potassium: 4 meq/L (ref 3.5–5.1)
Sodium: 138 meq/L (ref 135–145)

## 2018-09-24 LAB — CBC WITH DIFFERENTIAL/PLATELET
BASOS ABS: 0.1 10*3/uL (ref 0.0–0.1)
Basophils Relative: 0.9 % (ref 0.0–3.0)
Eosinophils Absolute: 0.1 10*3/uL (ref 0.0–0.7)
Eosinophils Relative: 1.9 % (ref 0.0–5.0)
HCT: 41.3 % (ref 36.0–46.0)
Hemoglobin: 14 g/dL (ref 12.0–15.0)
Lymphocytes Relative: 39.4 % (ref 12.0–46.0)
Lymphs Abs: 2.2 10*3/uL (ref 0.7–4.0)
MCHC: 33.8 g/dL (ref 30.0–36.0)
MCV: 94.6 fl (ref 78.0–100.0)
MONOS PCT: 8.3 % (ref 3.0–12.0)
Monocytes Absolute: 0.5 10*3/uL (ref 0.1–1.0)
Neutro Abs: 2.8 10*3/uL (ref 1.4–7.7)
Neutrophils Relative %: 49.5 % (ref 43.0–77.0)
Platelets: 309 10*3/uL (ref 150.0–400.0)
RBC: 4.37 Mil/uL (ref 3.87–5.11)
RDW: 13.3 % (ref 11.5–15.5)
WBC: 5.7 10*3/uL (ref 4.0–10.5)

## 2018-09-24 LAB — VITAMIN B12: Vitamin B-12: 1435 pg/mL — ABNORMAL HIGH (ref 211–911)

## 2018-09-24 MED ORDER — GABAPENTIN 300 MG PO CAPS: 300.0000 mg | ORAL_CAPSULE | Freq: Two times a day (BID) | ORAL | 3 refills | Status: DC

## 2018-09-24 MED ORDER — VITAMIN B-12 1000 MCG PO TABS: 1000.0000 ug | ORAL_TABLET | Freq: Every day | ORAL | Status: DC

## 2018-09-24 NOTE — Patient Instructions (Addendum)
You are having nerve type pain but unclear why.  Labs today.  Treat with gabapentin 300mg  at bedtime. If tolerated well, may increase to 300mg  twice daily.  If not improving with this, let me know for neurology evaluation with nerve conduction studies.

## 2018-09-24 NOTE — Assessment & Plan Note (Signed)
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 Recent hypothyroidism could contribute.  Rx gabapentin 300mg  nightly x1 wk with option to increase to BID dosing. Continue oral B12 supplementation.  Check CBC, BPM, and vit b12.  If unrevealing evaluation and not improving, consider referral for NCS.

## 2018-10-20 ENCOUNTER — Telehealth: Payer: Self-pay | Admitting: Family Medicine

## 2018-10-20 DIAGNOSIS — E890 Postprocedural hypothyroidism: Secondary | ICD-10-CM

## 2018-10-20 DIAGNOSIS — R202 Paresthesia of skin: Secondary | ICD-10-CM

## 2018-10-20 NOTE — Telephone Encounter (Signed)
Seen last month. Can we call for an update on leg numbness symptoms on gabapentin and b12? Would also ensure her thyroid dose had recently been adjusted as she was under treated - and this could contribute to nerve type symptoms.  She has thyroid check planned next month I believe.  To let us know if ongoing symptoms with normal thyroid levels for next step which would be neurology eval with nerve conduction studies.

## 2018-10-20 NOTE — Telephone Encounter (Signed)
Left message on vm per dpr relaying Dr. Synthia Innocent message. Asked pt to call back with answers to Dr. Synthia Innocent questions.

## 2018-10-20 NOTE — Telephone Encounter (Signed)
Pt returning call.  States the numbness is worse, especially when lying in bed. Says it feels like something is crawling under her skin. Still taking gabapentin, vit B12 and thyroid med has been adjusted. Next f/u 11/18/18.  Pt agrees to neurology referral.

## 2018-10-21 NOTE — Telephone Encounter (Signed)
Referral placed.

## 2018-10-21 NOTE — Addendum Note (Signed)
Addended by: Ria Bush on: 10/21/2018 07:45 AM   Modules accepted: Orders

## 2018-11-10 ENCOUNTER — Telehealth: Payer: Self-pay

## 2018-11-10 NOTE — Telephone Encounter (Signed)
Attempted to reach the pt and discuss her pre charting for 11/11/18 visit.  Pt's number would not connect to a person or vm.

## 2018-11-10 NOTE — Telephone Encounter (Signed)
Patient returning your call.

## 2018-11-10 NOTE — Telephone Encounter (Signed)
Pt returned my call and I was able to update allergies, med and PMH for 11/11/18 visit.  Pt understands that although there may be some limitations with this type of visit, we will take all precautions to reduce any security or privacy concerns.  Pt understands that this will be treated like an in office visit and we will file with pt's insurance, and there may be a patient responsible charge related to this service.

## 2018-11-11 ENCOUNTER — Ambulatory Visit (INDEPENDENT_AMBULATORY_CARE_PROVIDER_SITE_OTHER): Payer: Federal, State, Local not specified - PPO | Admitting: Neurology

## 2018-11-11 ENCOUNTER — Other Ambulatory Visit: Payer: Self-pay

## 2018-11-11 ENCOUNTER — Encounter: Payer: Self-pay | Admitting: Neurology

## 2018-11-11 DIAGNOSIS — G5711 Meralgia paresthetica, right lower limb: Secondary | ICD-10-CM

## 2018-11-11 MED ORDER — GABAPENTIN 300 MG PO CAPS: ORAL_CAPSULE | ORAL | 3 refills | Status: DC

## 2018-11-11 NOTE — Progress Notes (Signed)
     Virtual Visit via Video Note  I connected with Kelsey Yoder on 11/11/18 at 11:00 AM EDT by a video enabled telemedicine application and verified that I am speaking with the correct person using two identifiers.  Location: Patient: The patient is at home. Provider: Physician in office.   I discussed the limitations of evaluation and management by telemedicine and the availability of in person appointments. The patient expressed understanding and agreed to proceed.  History of Present Illness: Kelsey Yoder is a 73 year old right-handed black female with a history of thyroid disease.  The patient is sent over to the office today for onset of some numbness and dysesthesias affecting the right anterolateral aspect of the thigh that began in January 2020.  The patient had spontaneous onset of her symptoms.  She reports ongoing problems with tingling and burning sensations going down to the knee and then going up into the hip area on the right.  The sensation is in the lateral aspect of the thigh only.  She reports no weakness of the extremities, she occasionally may have some tingling in the toes but this is not a persistent problem.  She denies any problems whatsoever with the arms or hands.  She denies any neck pain or low back pain.  She denies issues controlling the bowels or the bladder.  She is able to walk without discomfort, she has no balance issues.  She is having difficulty sleeping at night because of the discomfort in the right thigh.  She was placed on gabapentin 300 mg twice daily but she cut back on the medication to 300 mg at night when she started running low on the medication.  She believes that there may be a modest benefit at the current dose with the gabapentin.  She is sent to this office for an evaluation.   Observations/Objective: The video evaluation reveals that the patient is alert and cooperative.  The speech is well enunciated, not aphasic.  The patient is  answering questions appropriately.  She has a symmetric face, she is able to protrude the tongue in the midline with good lateral movement of the tongue.  The patient has good finger-nose-finger and heel shin bilaterally.  Gait is normal, tandem gait normal.  The Romberg is negative.  Range move the neck is relatively full.  Assessment and Plan: 1.  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  The distribution of the discomfort is within the lateral femoral cutaneous nerve distribution.  The patient is on gabapentin, the dose will need to be increased.  She will go to 300 mg in the morning and 60 mg in the evening, she will call for any dose adjustments.  She will follow-up in about 3 months.  Follow Up Instructions: 65-month follow-up with me.   I discussed the assessment and treatment plan with the patient. The patient was provided an opportunity to ask questions and all were answered. The patient agreed with the plan and demonstrated an understanding of the instructions.   The patient was advised to call back or seek an in-person evaluation if the symptoms worsen or if the condition fails to improve as anticipated.  I provided 30 minutes of non-face-to-face time during this encounter.   Kathrynn Ducking, MD

## 2018-11-15 ENCOUNTER — Other Ambulatory Visit (INDEPENDENT_AMBULATORY_CARE_PROVIDER_SITE_OTHER): Payer: Federal, State, Local not specified - PPO

## 2018-11-15 ENCOUNTER — Other Ambulatory Visit: Payer: Self-pay

## 2018-11-15 ENCOUNTER — Other Ambulatory Visit: Payer: Federal, State, Local not specified - PPO

## 2018-11-15 DIAGNOSIS — E890 Postprocedural hypothyroidism: Secondary | ICD-10-CM | POA: Diagnosis not present

## 2018-11-15 LAB — TSH: TSH: 0.6 u[IU]/mL (ref 0.35–4.50)

## 2018-11-15 LAB — T4, FREE: Free T4: 1.28 ng/dL (ref 0.60–1.60)

## 2018-11-18 ENCOUNTER — Encounter: Payer: Self-pay | Admitting: Endocrinology

## 2018-11-18 ENCOUNTER — Ambulatory Visit (INDEPENDENT_AMBULATORY_CARE_PROVIDER_SITE_OTHER): Payer: Federal, State, Local not specified - PPO | Admitting: Endocrinology

## 2018-11-18 DIAGNOSIS — E890 Postprocedural hypothyroidism: Secondary | ICD-10-CM | POA: Diagnosis not present

## 2018-11-18 NOTE — Progress Notes (Signed)
Patient ID: Kelsey Yoder, female   DOB: Dec 18, 1945, 73 y.o.   MRN: [id number]                                                                                                               Reason for Appointment:  Post radioactive iodine hypothyroidism, follow-up   Today's office visit was provided via telemedicine using video technique Explained to the patient and the the limitations of evaluation and management by telemedicine and the availability of in person appointments.  The patient understood the limitations and agreed to proceed. Patient also understood that the telehealth visit is billable. . Location of the patient: Home . Location of the provider: Office Only the patient and myself were participating in the encounter   Referring physician: Danise Mina    History of Present Illness:   Prior to the consultation she had a history for the prior 2-3 months  of palpitations with her heart racing at times especially at night She was also having ankle swelling but no other typical symptoms of hyperthyroidism including weight loss.  Her labs showed that she had a positive result for thyrotropin receptor antibody measuring 13.5  Since she had continued need for antithyroid drugs she was given I-131 treatment on 10/24/16 with 12 mCi, her uptake was 82% Her methimazole was stopped on her visit on 11/14/16 when her free T4 was 0.71 However with her free T4 being upper normal in June methimazole was restarted at 5 mg daily Around September she started feeling more fatigued than before  She was found to be hypothyroid with free T4 0.53 and was started on levothyroxine 25 g daily  RECENT history:  She has been on levothyroxine supplementation since about 11/18  In 12/18 she was complaining of more fatigue and sleepiness and also some weight gain and her dose was increased to 37.5 mcg daily Subsequently in 6/19 she was again complaining of feeling more tired and sleepy without any  cold intolerance This was increased at that time to 50 mcg  On a previous visit she was complaining of feeling tired and sleepy but symptoms did not correlate with her thyroid levels  When her TSH was 10.5 she was not very symptomatic and was on 50 mcg levothyroxine  Not clear if she is improved with increasing her dose up to 75 mcg daily She confirms the dose today No recent fatigue She is seeing slight improvement in her weight however  As before she is taking her levothyroxine consistently before breakfast and her multivitamins and calcium about 6 hours later  TSH is now 10.5 without change in free T4    Wt Readings from Last 3 Encounters:  09/24/18 158 lb 5 oz (71.8 kg)  09/23/18 156 lb 3.2 oz (70.9 kg)  08/20/18 153 lb 8 oz (69.6 kg)    Thyroid levels from recent labs are:   Lab Results  Component Value Date   TSH 0.60 11/15/2018   TSH 10.55 (H) 09/20/2018   TSH 2.06 03/22/2018   FREET4 1.28  11/15/2018   FREET4 0.93 09/20/2018   FREET4 0.93 03/22/2018          Allergies as of 11/18/2018   No Known Allergies     Medication List       Accurate as of Nov 18, 2018  1:17 PM. If you have any questions, ask your nurse or doctor.        Calcium Carb-Cholecalciferol 600-800 MG-UNIT Tabs Take 1 tablet by mouth daily.   fish oil-omega-3 fatty acids 1000 MG capsule Take 1 g by mouth daily.   gabapentin 300 MG capsule Commonly known as:  NEURONTIN 1 capsule in the morning, 2 in the evening   levothyroxine 75 MCG tablet Commonly known as:  SYNTHROID Take 1 tablet (75 mcg total) by mouth daily.   multivitamin tablet Take 1 tablet by mouth daily.   OVER THE COUNTER MEDICATION Take 1 capsule by mouth daily. Tumeric   triamcinolone cream 0.1 % Commonly known as:  KENALOG Apply 1 application topically 2 (two) times daily. Apply to AA.   vitamin B-12 1000 MCG tablet Commonly known as:  CYANOCOBALAMIN Take 1 tablet (1,000 mcg total) by mouth daily.    Vitamin D3 50 MCG (2000 UT) capsule Take 1 capsule by mouth daily.           Past Medical History:  Diagnosis Date  . Aortic valve sclerosis    last echo 04/2009  . 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 Cataract   . Hyperthyroidism 01/07/2016   Dr Dwyane Dee, positive TSHR Ab  . Osteopenia   . PONV (postoperative nausea and vomiting)    post-op nausea  . Sinus drainage     Past Surgical History:  Procedure Laterality Date  . BREAST BIOPSY Right 2010   benign   . BREAST EXCISIONAL BIOPSY Right 2010   benign  . CARPAL TUNNEL RELEASE Right 04/25/2013   CARPAL TUNNEL RELEASE;  Surgeon: Tennis Must, MD;  Location: [facility]  . CATARACT EXTRACTION Bilateral 2013  . COLONOSCOPY  2002   rectosigmoid bulge thought 2/2 fibroids  . COLONOSCOPY  2005   polyps, diverticulosis, rpt 7-10 yrs ([facility])  . COLONOSCOPY  08/2015   HP, mild diverticulosis, rpt 5 yrs  ([facility])  . DENTAL SURGERY     dental implants  . ESOPHAGOGASTRODUODENOSCOPY  2005   antral gastropathy, neg H pylori screen ([facility])  . TONSILLECTOMY  1976  . TOTAL ABDOMINAL HYSTERECTOMY W/ BILATERAL SALPINGOOPHORECTOMY  2008   h/o abnormal pap smears    Family History  Problem Relation Age of Onset  . Diabetes Mother        double amputee  . Congestive Heart Failure Mother   . Diabetes Father   . Stroke Sister   . Lupus Sister        X 2  . Diabetes Brother   . Cancer Brother        duodenal, prostate   . Stroke Maternal Grandmother   . Stroke Maternal Grandfather   . Breast cancer Sister 34  . Colon cancer Neg Hx   . Colon polyps Neg Hx   . Thyroid disease Neg Hx     Social History:  reports that she has never smoked. She has never used smokeless tobacco. She reports that she does not drink alcohol or use drugs.  Allergies: No Known Allergies    Review of Systems   No history of hypertension   Examination:   There were no vitals taken  for this visit.  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

## 2018-12-16 ENCOUNTER — Other Ambulatory Visit: Payer: Self-pay | Admitting: Family Medicine

## 2018-12-16 DIAGNOSIS — Z1231 Encounter for screening mammogram for malignant neoplasm of breast: Secondary | ICD-10-CM

## 2018-12-28 ENCOUNTER — Other Ambulatory Visit: Payer: Self-pay

## 2018-12-28 ENCOUNTER — Telehealth: Payer: Self-pay | Admitting: Endocrinology

## 2018-12-28 MED ORDER — LEVOTHYROXINE SODIUM 75 MCG PO TABS: 75.0000 ug | ORAL_TABLET | Freq: Every day | ORAL | 3 refills | Status: DC

## 2018-12-28 NOTE — Telephone Encounter (Signed)
Rx sent 

## 2018-12-28 NOTE — Telephone Encounter (Signed)
MEDICATION: Levothryoxine  PHARMACY:  [facility] [place] : yes  IS PATIENT OUT OF MEDICATION: no  IF NOT; HOW MUCH IS LEFT: 3 days worth  LAST APPOINTMENT DATE: @5 /01/2019  NEXT APPOINTMENT DATE:@9 /02/2019  DO WE HAVE YOUR PERMISSION TO LEAVE A DETAILED MESSAGE: yes  OTHER COMMENTS:    **Let patient know to contact pharmacy at the end of the day to make sure medication is ready. **  ** Please notify patient to allow 48-72 hours to process**  **Encourage patient to contact the pharmacy for refills or they can request refills through [facility]**

## 2019-02-01 ENCOUNTER — Other Ambulatory Visit: Payer: Self-pay

## 2019-02-01 ENCOUNTER — Ambulatory Visit
Admission: RE | Admit: 2019-02-01 | Discharge: 2019-02-01 | Disposition: A | Discharge status: Home / Self Care | Payer: Federal, State, Local not specified - PPO | Source: Ambulatory Visit | Attending: Family Medicine | Admitting: Family Medicine

## 2019-02-01 DIAGNOSIS — Z1231 Encounter for screening mammogram for malignant neoplasm of breast: Secondary | ICD-10-CM

## 2019-02-02 ENCOUNTER — Encounter: Payer: Self-pay | Admitting: Family Medicine

## 2019-02-02 LAB — HM MAMMOGRAPHY

## 2019-03-22 ENCOUNTER — Other Ambulatory Visit (INDEPENDENT_AMBULATORY_CARE_PROVIDER_SITE_OTHER): Payer: Federal, State, Local not specified - PPO

## 2019-03-22 ENCOUNTER — Other Ambulatory Visit: Payer: Self-pay

## 2019-03-22 DIAGNOSIS — E890 Postprocedural hypothyroidism: Secondary | ICD-10-CM | POA: Diagnosis not present

## 2019-03-22 LAB — TSH: TSH: 0.11 u[IU]/mL — ABNORMAL LOW (ref 0.35–4.50)

## 2019-03-22 LAB — T4, FREE: Free T4: 1.13 ng/dL (ref 0.60–1.60)

## 2019-03-24 ENCOUNTER — Ambulatory Visit (INDEPENDENT_AMBULATORY_CARE_PROVIDER_SITE_OTHER): Payer: Federal, State, Local not specified - PPO | Admitting: Endocrinology

## 2019-03-24 ENCOUNTER — Encounter: Payer: Self-pay | Admitting: Endocrinology

## 2019-03-24 ENCOUNTER — Other Ambulatory Visit: Payer: Self-pay

## 2019-03-24 DIAGNOSIS — E890 Postprocedural hypothyroidism: Secondary | ICD-10-CM | POA: Diagnosis not present

## 2019-03-24 MED ORDER — LEVOTHYROXINE SODIUM 75 MCG PO TABS: 75.0000 ug | ORAL_TABLET | Freq: Every day | ORAL | 3 refills | Status: DC

## 2019-03-24 NOTE — Progress Notes (Signed)
Patient ID: Kelsey Yoder, female   DOB: 09/07/1945, 73 y.o.   MRN: [id number]                                                                                                               Reason for Appointment:  Post radioactive iodine hypothyroidism, follow-up   Today's office visit was provided via telemedicine using video technique Explained to the patient and the the limitations of evaluation and management by telemedicine and the availability of in person appointments.  The patient understood the limitations and agreed to proceed. Patient also understood that the telehealth visit is billable. . Location of the patient: Home . Location of the provider: Office Only the patient and myself were participating in the encounter   Referring physician: Danise Mina    History of Present Illness:   Prior to the consultation she had a history for the prior 2-3 months  of palpitations with her heart racing at times especially at night She was also having ankle swelling but no other typical symptoms of hyperthyroidism including weight loss.  Her labs showed that she had a positive result for thyrotropin receptor antibody measuring 13.5  Since she had continued need for antithyroid drugs she was given I-131 treatment on 10/24/16 with 12 mCi, her uptake was 82% Her methimazole was stopped on her visit on 11/14/16 when her free T4 was 0.71 However with her free T4 being upper normal in June methimazole was restarted at 5 mg daily Around September she started feeling more fatigued than before  She was found to be hypothyroid with free T4 0.53 and was started on levothyroxine 25 g daily  RECENT history:  She has been on levothyroxine supplementation since about 11/18  In 12/18 she was complaining of more fatigue and sleepiness and also some weight gain and her dose was increased to 37.5 mcg daily Subsequently in 6/19 she was again complaining of feeling more tired and sleepy without any  cold intolerance This was increased at that time to 50 mcg  When her TSH was 10.5 she was not very symptomatic and was on 50 mcg levothyroxine  Not clear if she improved symptomatically with increasing her dose up to 75 mcg daily  Follow-up in 5/20 showed her TSH to be 0.69 her dose was continued at 75 mcg daily She feels fairly good without any unusual fatigue, weight loss, palpitations or shakiness  She confirms she is still taking her levothyroxine consistently before breakfast and her multivitamins and calcium about 6 hours later  TSH is now suppressed at 0.11 with no significant change in T4 level    Wt Readings from Last 3 Encounters:  09/24/18 158 lb 5 oz (71.8 kg)  09/23/18 156 lb 3.2 oz (70.9 kg)  08/20/18 153 lb 8 oz (69.6 kg)    Thyroid levels from recent labs are:   Lab Results  Component Value Date   TSH 0.11 (L) 03/22/2019   TSH 0.60 11/15/2018   TSH 10.55 (H) 09/20/2018   FREET4 1.13 03/22/2019  FREET4 1.28 11/15/2018   FREET4 0.93 09/20/2018          Allergies as of 03/24/2019   No Known Allergies     Medication List       Accurate as of March 24, 2019  1:39 PM. If you have any questions, ask your nurse or doctor.        Calcium Carb-Cholecalciferol 600-800 MG-UNIT Tabs Take 1 tablet by mouth daily.   fish oil-omega-3 fatty acids 1000 MG capsule Take 1 g by mouth daily.   gabapentin 300 MG capsule Commonly known as: NEURONTIN 1 capsule in the morning, 2 in the evening   levothyroxine 75 MCG tablet Commonly known as: SYNTHROID Take 1 tablet (75 mcg total) by mouth daily.   multivitamin tablet Take 1 tablet by mouth daily.   OVER THE COUNTER MEDICATION Take 1 capsule by mouth daily. Tumeric   triamcinolone cream 0.1 % Commonly known as: KENALOG Apply 1 application topically 2 (two) times daily. Apply to AA.   vitamin B-12 1000 MCG tablet Commonly known as: CYANOCOBALAMIN Take 1 tablet (1,000 mcg total) by mouth daily.    Vitamin D3 50 MCG (2000 UT) capsule Take 1 capsule by mouth daily.           Past Medical History:  Diagnosis Date  . Aortic valve sclerosis    last echo 04/2009  . 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 Cataract   . Hyperthyroidism 01/07/2016   Dr Dwyane Dee, positive TSHR Ab  . Osteopenia   . PONV (postoperative nausea and vomiting)    post-op nausea  . Sinus drainage     Past Surgical History:  Procedure Laterality Date  . BREAST BIOPSY Right 2010   benign   . BREAST EXCISIONAL BIOPSY Right 2010   benign  . CARPAL TUNNEL RELEASE Right 04/25/2013   CARPAL TUNNEL RELEASE;  Surgeon: Tennis Must, MD;  Location: [facility]  . CATARACT EXTRACTION Bilateral 2013  . COLONOSCOPY  2002   rectosigmoid bulge thought 2/2 fibroids  . COLONOSCOPY  2005   polyps, diverticulosis, rpt 7-10 yrs ([facility])  . COLONOSCOPY  08/2015   HP, mild diverticulosis, rpt 5 yrs  ([facility])  . DENTAL SURGERY     dental implants  . ESOPHAGOGASTRODUODENOSCOPY  2005   antral gastropathy, neg H pylori screen ([facility])  . TONSILLECTOMY  1976  . TOTAL ABDOMINAL HYSTERECTOMY W/ BILATERAL SALPINGOOPHORECTOMY  2008   h/o abnormal pap smears    Family History  Problem Relation Age of Onset  . Diabetes Mother        double amputee  . Congestive Heart Failure Mother   . Diabetes Father   . Stroke Sister   . Lupus Sister        X 2  . Diabetes Brother   . Cancer Brother        duodenal, prostate   . Stroke Maternal Grandmother   . Stroke Maternal Grandfather   . Breast cancer Sister 87  . Colon cancer Neg Hx   . Colon polyps Neg Hx   . Thyroid disease Neg Hx     Social History:  reports that she has never smoked. She has never used smokeless tobacco. She reports that she does not drink alcohol or use drugs.  Allergies: No Known Allergies    Review of Systems      Examination:   There were no vitals taken for this visit.     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

## 2019-04-25 ENCOUNTER — Telehealth: Payer: Self-pay

## 2019-04-25 NOTE — Telephone Encounter (Signed)
Pt has been r/s with NP °

## 2019-04-25 NOTE — Telephone Encounter (Signed)
Lvm asking for pt to call back so we could reschedule the 04/29/2019 appt due to office not being opened on Fridays.  Pt was provided with [facility] #.

## 2019-04-29 ENCOUNTER — Encounter

## 2019-04-29 ENCOUNTER — Ambulatory Visit: Payer: Federal, State, Local not specified - PPO | Admitting: Neurology

## 2019-05-18 NOTE — Progress Notes (Signed)
PATIENT: Kelsey Yoder DOB: Dec 24, 1945  REASON FOR VISIT: follow up HISTORY FROM: patient  HISTORY OF PRESENT ILLNESS: Today 05/19/19  Ms. Timberman is a 73 year old female with history of numbness and dysesthesias affecting the right anterior lateral aspect of the thigh since January 2020.  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  She remains on gabapentin.  She reports some benefit with gabapentin, but she is only taking 600 mg at bedtime.  She indicates her symptoms do not bother her during the day.  There is no pain associated, only burning and tingling to the right anterior lateral aspect of her thigh.  She feels the symptoms at night, mostly when lying on her right side.  She has been dealing with some thyroid issues, has gained about 20 pounds since.  She denies any trouble with walking or balance.  She has not had any falls.  She denies any changes to her bowels or bladder.  She does not have history of diabetes.  She presents today for follow-up unaccompanied.  HISTORY 11/11/2018 Dr. Jannifer Franklin: Kelsey Yoder is a 73 year old right-handed black female with a history of thyroid disease. The patient is sent over to the office today for onset of some numbness and dysesthesias affecting the right anterolateral aspect of the thigh that began in January 2020.  The patient had spontaneous onset of her symptoms.  She reports ongoing problems with tingling and burning sensations going down to the knee and then going up into the hip area on the right.  The sensation is in the lateral aspect of the thigh only.  She reports no weakness of the extremities, she occasionally may have some tingling in the toes but this is not a persistent problem.  She denies any problems whatsoever with the arms or hands.  She denies any neck pain or low back pain.  She denies issues controlling the bowels or the bladder.  She is able to walk without discomfort, she has no balance issues.   She is having difficulty sleeping at night because of the discomfort in the right thigh.  She was placed on gabapentin 300 mg twice daily but she cut back on the medication to 300 mg at night when she started running low on the medication. She believes that there may be a modest benefit at the current dose with the gabapentin.  She is sent to this office for an evaluation.  REVIEW OF SYSTEMS: Out of a complete 14 system review of symptoms, the patient complains only of the following symptoms, and all other reviewed systems are negative.  Numbness  ALLERGIES: No Known Allergies  HOME MEDICATIONS: Outpatient Medications Prior to Visit  Medication Sig Dispense Refill  . Calcium Carb-Cholecalciferol 600-800 MG-UNIT TABS Take 1 tablet by mouth daily.    . Cholecalciferol (VITAMIN D3) 50 MCG (2000 UT) capsule Take 1 capsule by mouth daily.    Marland Kitchen levothyroxine (SYNTHROID) 75 MCG tablet Take 1 tablet (75 mcg total) by mouth daily before breakfast. Skip dose once a week 90 tablet 3  . Multiple Vitamin (MULTIVITAMIN) tablet Take 1 tablet by mouth daily.    Marland Kitchen OVER THE COUNTER MEDICATION Take 1 capsule by mouth daily. Tumeric    . vitamin B-12 (CYANOCOBALAMIN) 1000 MCG tablet Take 1 tablet (1,000 mcg total) by mouth daily.    Marland Kitchen gabapentin (NEURONTIN) 300 MG capsule 1 capsule in the morning, 2 in the evening 90 capsule 3  . fish oil-omega-3 fatty acids 1000 MG  capsule Take 1 g by mouth daily.    Marland Kitchen triamcinolone cream (KENALOG) 0.1 % Apply 1 application topically 2 (two) times daily. Apply to AA. 30 g 0   No facility-administered medications prior to visit.     PAST MEDICAL HISTORY: Past Medical History:  Diagnosis Date  . Aortic valve sclerosis    last echo 04/2009  . 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 Cataract   . Hyperthyroidism 01/07/2016   Dr Dwyane Dee, positive TSHR Ab  . Osteopenia   . PONV (postoperative nausea and vomiting)    post-op nausea  . Sinus drainage     PAST  SURGICAL HISTORY: Past Surgical History:  Procedure Laterality Date  . BREAST BIOPSY Right 2010   benign   . BREAST EXCISIONAL BIOPSY Right 2010   benign  . CARPAL TUNNEL RELEASE Right 04/25/2013   CARPAL TUNNEL RELEASE;  Surgeon: Tennis Must, MD;  Location: [facility]  . CATARACT EXTRACTION Bilateral 2013  . COLONOSCOPY  2002   rectosigmoid bulge thought 2/2 fibroids  . COLONOSCOPY  2005   polyps, diverticulosis, rpt 7-10 yrs ([facility])  . COLONOSCOPY  08/2015   HP, mild diverticulosis, rpt 5 yrs  ([facility])  . DENTAL SURGERY     dental implants  . ESOPHAGOGASTRODUODENOSCOPY  2005   antral gastropathy, neg H pylori screen ([facility])  . TONSILLECTOMY  1976  . TOTAL ABDOMINAL HYSTERECTOMY W/ BILATERAL SALPINGOOPHORECTOMY  2008   h/o abnormal pap smears    FAMILY HISTORY: Family History  Problem Relation Age of Onset  . Diabetes Mother        double amputee  . Congestive Heart Failure Mother   . Diabetes Father   . Stroke Sister   . Lupus Sister        X 2  . Diabetes Brother   . Cancer Brother        duodenal, prostate   . Stroke Maternal Grandmother   . Stroke Maternal Grandfather   . Breast cancer Sister 63  . Colon cancer Neg Hx   . Colon polyps Neg Hx   . Thyroid disease Neg Hx     SOCIAL HISTORY: Social History   Socioeconomic History  . Marital status: Married    Spouse name: Not on file  . Number of children: Not on file  . Years of education: Not on file  . Highest education level: Not on file  Occupational History  . Not on file  Social Needs  . Financial resource strain: Not on file  . Food insecurity    Worry: Not on file    Inability: Not on file  . Transportation needs    Medical: Not on file    Non-medical: Not on file  Tobacco Use  . Smoking status: Never Smoker  . Smokeless tobacco: Never Used  Substance and Sexual Activity  . Alcohol use: No    Alcohol/week: 0.0 standard drinks  . Drug use: No  . Sexual activity:  Yes    Partners: Male    Birth control/protection: Surgical  Lifestyle  . Physical activity    Days per week: Not on file    Minutes per session: Not on file  . Stress: Not on file  Relationships  . Social Herbalist on phone: Not on file    Gets together: Not on file    Attends religious service: Not on file    Active member of club or organization: Not on  file    Attends meetings of clubs or organizations: Not on file    Relationship status: Not on file  . Intimate partner violence    Fear of current or ex partner: Not on file    Emotionally abused: Not on file    Physically abused: Not on file    Forced sexual activity: Not on file  Other Topics Concern  . Not on file  Social History Narrative   Caffeine: non   Lives alone, no pets   Occupation: retired, was in Armed forces operational officer   Edu: Brewing technologist   Activity: goes to gym 3-4x/wk   Diet: some water, fruits and vegetables daily   Right handed    PHYSICAL EXAM  Vitals:   05/19/19 0935  BP: 109/65  Pulse: 77  Temp: 98.2 F (36.8 C)  TempSrc: Oral  Weight: 150 lb 12.8 oz (68.4 kg)  Height: 5' (1.524 m)   Body mass index is 29.45 kg/m.  Generalized: Well developed, in no acute distress   Neurological examination  Mentation: Alert oriented to time, place, history taking. Follows all commands speech and language fluent Cranial nerve II-XII: Pupils were equal round reactive to light. Extraocular movements were full, visual field were full on confrontational test. Facial sensation and strength were normal.  Head turning and shoulder shrug  were normal and symmetric. Motor: The motor testing reveals 5 over 5 strength of all 4 extremities. Good symmetric motor tone is noted throughout.  Sensory: Sensory testing is intact to soft touch, pinprick on all 4 extremities. No evidence of extinction is noted.  Coordination: Cerebellar testing reveals good finger-nose-finger and heel-to-shin bilaterally.  Gait and  station: Gait is normal. Tandem gait is normal. Romberg is negative. No drift is seen.  Reflexes: Deep tendon reflexes are symmetric and normal bilaterally.   DIAGNOSTIC DATA (LABS, IMAGING, TESTING) - I reviewed patient records, labs, notes, testing and imaging myself where available.  Lab Results  Component Value Date   WBC 5.7 09/24/2018   HGB 14.0 09/24/2018   HCT 41.3 09/24/2018   MCV 94.6 09/24/2018   PLT 309.0 09/24/2018      Component Value Date/Time   NA 138 09/24/2018 0740   NA 140 08/13/2011   K 4.0 09/24/2018 0740   K 4.3 08/13/2011   CL 103 09/24/2018 0740   CL 106 08/13/2011   CO2 27 09/24/2018 0740   CO2 21 08/13/2011   GLUCOSE 90 09/24/2018 0740   BUN 12 09/24/2018 0740   BUN 17 08/13/2011   CREATININE 1.00 09/24/2018 0740   CREATININE 0.91 08/13/2011   CALCIUM 9.1 09/24/2018 0740   CALCIUM 9.6 08/13/2011   PROT 7.3 06/18/2018 0818   PROT 6.7 08/13/2011   ALBUMIN 3.9 06/18/2018 0818   ALBUMIN 4.0 08/13/2011   AST 19 06/18/2018 0818   AST 16 08/13/2011   ALT 16 06/18/2018 0818   ALKPHOS 94 06/18/2018 0818   ALKPHOS 89 08/13/2011   BILITOT 0.4 06/18/2018 0818   BILITOT 0.4 08/13/2011   Lab Results  Component Value Date   CHOL 226 (H) 06/18/2018   HDL 49.70 06/18/2018   LDLCALC 157 (H) 06/18/2018   LDLDIRECT 151.4 05/24/2012   TRIG 101.0 06/18/2018   CHOLHDL 5 06/18/2018   No results found for: HGBA1C Lab Results  Component Value Date   VITAMINB12 1,435 (H) 09/24/2018   Lab Results  Component Value Date   TSH 0.11 (L) 03/22/2019      ASSESSMENT AND PLAN 73 y.o. year old female  has a past medical history of Aortic valve sclerosis, Carpal tunnel syndrome of right wrist (04/2013), Cataract, Hyperthyroidism (01/07/2016), Osteopenia, PONV (postoperative nausea and vomiting), and Sinus drainage. here with:  1.  Meralgia paresthetica, right  Overall, she has seen some improvement with gabapentin.  She has only been taking gabapentin 600 mg at  bedtime.  She will try to take 300 mg in mid afternoon, to see if that helps with her evening symptoms.  We have discussed the importance of avoiding tight waistbands and belts, also exercise and weight loss may be beneficial. She does not have diabetes. She will follow-up in 6 months or sooner if needed.  I did advise if her symptoms worsen or she develops any new symptoms she should let us know.  I spent 15 minutes with the patient. 50% of this time was spent discussing her plan of care.  Butler Denmark, AGNP-C, DNP 05/19/2019, 10:07 AM [facility] [address] [place], [place] [address] [phone]

## 2019-05-19 ENCOUNTER — Other Ambulatory Visit: Payer: Self-pay

## 2019-05-19 ENCOUNTER — Ambulatory Visit: Payer: Federal, State, Local not specified - PPO | Admitting: Neurology

## 2019-05-19 ENCOUNTER — Encounter: Payer: Self-pay | Admitting: Neurology

## 2019-05-19 VITALS — BP 109/65 | HR 77 | Temp 98.2°F | Ht 60.0 in | Wt 150.8 lb

## 2019-05-19 DIAGNOSIS — R202 Paresthesia of skin: Secondary | ICD-10-CM | POA: Diagnosis not present

## 2019-05-19 MED ORDER — GABAPENTIN 300 MG PO CAPS: ORAL_CAPSULE | ORAL | 6 refills | Status: DC

## 2019-05-19 NOTE — Patient Instructions (Signed)
Continue gabapentin 300 mg in the afternoon, 600 mg at bedtime. Try to avoid tight wastebands or belts. Consider exercise, weight loss for symptoms. Diabetes is also a risk factor for this condition.

## 2019-05-21 NOTE — Progress Notes (Signed)
I have read the note, and I agree with the clinical assessment and plan.  Marilynn Ekstein K America Sandall   

## 2019-06-26 ENCOUNTER — Other Ambulatory Visit: Payer: Self-pay | Admitting: Family Medicine

## 2019-06-26 DIAGNOSIS — R202 Paresthesia of skin: Secondary | ICD-10-CM

## 2019-06-26 DIAGNOSIS — R739 Hyperglycemia, unspecified: Secondary | ICD-10-CM

## 2019-06-26 DIAGNOSIS — E785 Hyperlipidemia, unspecified: Secondary | ICD-10-CM

## 2019-06-26 DIAGNOSIS — E559 Vitamin D deficiency, unspecified: Secondary | ICD-10-CM

## 2019-06-26 DIAGNOSIS — E0590 Thyrotoxicosis, unspecified without thyrotoxic crisis or storm: Secondary | ICD-10-CM

## 2019-06-26 DIAGNOSIS — R748 Abnormal levels of other serum enzymes: Secondary | ICD-10-CM

## 2019-06-27 ENCOUNTER — Other Ambulatory Visit: Payer: Federal, State, Local not specified - PPO

## 2019-06-27 ENCOUNTER — Other Ambulatory Visit: Payer: Self-pay

## 2019-06-27 ENCOUNTER — Other Ambulatory Visit (INDEPENDENT_AMBULATORY_CARE_PROVIDER_SITE_OTHER): Payer: Federal, State, Local not specified - PPO

## 2019-06-27 DIAGNOSIS — E0590 Thyrotoxicosis, unspecified without thyrotoxic crisis or storm: Secondary | ICD-10-CM | POA: Diagnosis not present

## 2019-06-27 DIAGNOSIS — E785 Hyperlipidemia, unspecified: Secondary | ICD-10-CM

## 2019-06-27 DIAGNOSIS — E559 Vitamin D deficiency, unspecified: Secondary | ICD-10-CM | POA: Diagnosis not present

## 2019-06-27 DIAGNOSIS — R202 Paresthesia of skin: Secondary | ICD-10-CM | POA: Diagnosis not present

## 2019-06-27 LAB — COMPREHENSIVE METABOLIC PANEL
ALT: 14 U/L (ref 0–35)
AST: 17 U/L (ref 0–37)
Albumin: 3.9 g/dL (ref 3.5–5.2)
Alkaline Phosphatase: 101 U/L (ref 39–117)
BUN: 16 mg/dL (ref 6–23)
CO2: 28 mEq/L (ref 19–32)
Calcium: 9.5 mg/dL (ref 8.4–10.5)
Chloride: 104 mEq/L (ref 96–112)
Creatinine, Ser: 0.91 mg/dL (ref 0.40–1.20)
GFR: 73.69 mL/min (ref >60.00)
Glucose, Bld: 88 mg/dL (ref 70–99)
Potassium: 4.3 mEq/L (ref 3.5–5.1)
Sodium: 139 mEq/L (ref 135–145)
Total Bilirubin: 0.5 mg/dL (ref 0.2–1.2)
Total Protein: 6.6 g/dL (ref 6.0–8.3)

## 2019-06-27 LAB — LIPID PANEL
Cholesterol: 228 mg/dL — ABNORMAL HIGH (ref 0–200)
HDL: 46.8 mg/dL (ref >39.00)
LDL Cholesterol: 159 mg/dL — ABNORMAL HIGH (ref 0–99)
NonHDL: 181.22
Total CHOL/HDL Ratio: 5
Triglycerides: 111 mg/dL (ref 0.0–149.0)
VLDL: 22.2 mg/dL (ref 0.0–40.0)

## 2019-06-27 LAB — VITAMIN B12: Vitamin B-12: 1408 pg/mL — ABNORMAL HIGH (ref 211–911)

## 2019-06-27 LAB — T4, FREE: Free T4: 1.13 ng/dL (ref 0.60–1.60)

## 2019-06-27 LAB — TSH: TSH: 1.68 u[IU]/mL (ref 0.35–4.50)

## 2019-06-27 LAB — VITAMIN D 25 HYDROXY (VIT D DEFICIENCY, FRACTURES): VITD: 50.29 ng/mL (ref 30.00–100.00)

## 2019-06-29 ENCOUNTER — Ambulatory Visit (INDEPENDENT_AMBULATORY_CARE_PROVIDER_SITE_OTHER): Payer: Federal, State, Local not specified - PPO | Admitting: Family Medicine

## 2019-06-29 ENCOUNTER — Encounter: Payer: Self-pay | Admitting: Family Medicine

## 2019-06-29 ENCOUNTER — Other Ambulatory Visit: Payer: Self-pay

## 2019-06-29 VITALS — BP 120/60 | HR 88 | Temp 97.8°F | Ht 59.5 in | Wt 150.4 lb

## 2019-06-29 DIAGNOSIS — E785 Hyperlipidemia, unspecified: Secondary | ICD-10-CM | POA: Diagnosis not present

## 2019-06-29 DIAGNOSIS — Z0000 Encounter for general adult medical examination without abnormal findings: Secondary | ICD-10-CM | POA: Diagnosis not present

## 2019-06-29 DIAGNOSIS — M85852 Other specified disorders of bone density and structure, left thigh: Secondary | ICD-10-CM | POA: Diagnosis not present

## 2019-06-29 DIAGNOSIS — E0590 Thyrotoxicosis, unspecified without thyrotoxic crisis or storm: Secondary | ICD-10-CM

## 2019-06-29 DIAGNOSIS — G5711 Meralgia paresthetica, right lower limb: Secondary | ICD-10-CM

## 2019-06-29 DIAGNOSIS — E559 Vitamin D deficiency, unspecified: Secondary | ICD-10-CM

## 2019-06-29 MED ORDER — CYANOCOBALAMIN 500 MCG PO TABS: 500.0000 ug | ORAL_TABLET | Freq: Every day | ORAL | Status: DC

## 2019-06-29 NOTE — Assessment & Plan Note (Addendum)
Chronic, stable TFTs on current regimen. Appreciate endo care, has appt tomorrow.

## 2019-06-29 NOTE — Progress Notes (Signed)
This visit was conducted in person.  BP 120/60 (BP Location: Left Arm, Patient Position: Sitting, Cuff Size: Normal)   Pulse 88   Temp 97.8 F (36.6 C) (Temporal)   Ht 4' 11.5" (1.511 m)   Wt 150 lb 7 oz (68.2 kg)   SpO2 97%   BMI 29.88 kg/m    CC: CPE Subjective:    Patient ID: Kelsey Yoder, female    DOB: [birth year], 73 y.o.   MRN: [id number]  HPI: Kelsey Yoder is a 73 y.o. female presenting on 06/29/2019 for Annual Exam   Did not see health advisor this year - does not have medicare. She has Theatre stage manager.   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  Persistent R lateral thigh burning despite b12 replacement and gabapentin 300/600mg  daily. Symptoms worse at night time - keeps her awake. No RLS symptoms.  Preventative: COLONOSCOPY 08/2015;HP, mild diverticulosis, rpt 5 yrs ([facility]) [facility] - [facility]YN4/2018(Dr. Anyanwu). Planned f/u Q2 yrs.S/p total hysterectomy 2008 for h/o abnormal paps, ovaries removed as well.  Mammogram 01/2019 WNL DEXA -11/2016- osteopenia, T -1.8at hip. Regular with calcium and vit D. Limited walking due to recent illness. Flu shottoday Td 2000, 2010.  Pneumovax 2014. prevnar 05/2014 Zostavax 2012 Shingrix - discussed, declines  Advanced directive:living will in chart 06/2014 - no prolonged life support if terminal condition. Would wantHCPOAto besister [name]. HCPOA form provided previously.  Seat belt use discussed Sunscreen use discussed. 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  Diet: some water, fruits and vegetables daily     Relevant past medical,  surgical, family and social history reviewed and updated as indicated. Interim medical history since our last visit reviewed. Allergies and medications reviewed and updated. Outpatient Medications Prior to Visit  Medication Sig Dispense Refill  . Calcium Carb-Cholecalciferol 600-800 MG-UNIT TABS Take 1 tablet by mouth daily.    . Cholecalciferol (VITAMIN D3) 50 MCG (2000 UT) capsule Take 1 capsule by mouth daily.    Marland Kitchen gabapentin (NEURONTIN) 300 MG capsule 1 capsule in the morning, 2 in the evening 90 capsule 6  . levothyroxine (SYNTHROID) 75 MCG tablet Take 1 tablet (75 mcg total) by mouth daily before breakfast. Skip dose once a week 90 tablet 3  . Multiple Vitamin (MULTIVITAMIN) tablet Take 1 tablet by mouth daily.    Marland Kitchen OVER THE COUNTER MEDICATION Take 1 capsule by mouth daily. Tumeric    . vitamin B-12 (CYANOCOBALAMIN) 1000 MCG tablet Take 1 tablet (1,000 mcg total) by mouth daily.    . fish oil-omega-3 fatty acids 1000 MG capsule Take 1 g by mouth daily.    Marland Kitchen triamcinolone cream (KENALOG) 0.1 % Apply 1 application topically 2 (two) times daily. Apply to AA. 30 g 0   No facility-administered medications prior to visit.     Per HPI unless specifically indicated in ROS section below Review of Systems  Constitutional: Negative for activity change, appetite change, chills, fatigue, fever and unexpected weight change.  HENT: Negative for hearing loss.   Eyes: Negative for visual disturbance.  Respiratory: Negative for cough, chest tightness, shortness of breath and wheezing.   Cardiovascular:  Negative for chest pain, palpitations and leg swelling.  Gastrointestinal: Negative for abdominal distention, abdominal pain, blood in stool, constipation, diarrhea, nausea and vomiting.  Genitourinary: Negative for difficulty urinating and hematuria.  Musculoskeletal: Negative for arthralgias, myalgias and neck pain.  Skin: Negative for rash.  Neurological: Negative for dizziness, seizures, syncope  and headaches.  Hematological: Negative for adenopathy. Does not bruise/bleed easily.  Psychiatric/Behavioral: Negative for dysphoric mood. The patient is not nervous/anxious.    Objective:    BP 120/60 (BP Location: Left Arm, Patient Position: Sitting, Cuff Size: Normal)   Pulse 88   Temp 97.8 F (36.6 C) (Temporal)   Ht 4' 11.5" (1.511 m)   Wt 150 lb 7 oz (68.2 kg)   SpO2 97%   BMI 29.88 kg/m   Wt Readings from Last 3 Encounters:  06/29/19 150 lb 7 oz (68.2 kg)  05/19/19 150 lb 12.8 oz (68.4 kg)  09/24/18 158 lb 5 oz (71.8 kg)    Physical Exam Vitals and nursing note reviewed.  Constitutional:      General: She is not in acute distress.    Appearance: Normal appearance. She is well-developed. She is not ill-appearing.  HENT:     Head: Normocephalic and atraumatic.     Right Ear: Hearing, tympanic membrane, ear canal and external ear normal.     Left Ear: Hearing, tympanic membrane, ear canal and external ear normal.     Nose: Nose normal.     Mouth/Throat:     Pharynx: Uvula midline.  Eyes:     General: No scleral icterus.    Extraocular Movements: Extraocular movements intact.     Conjunctiva/sclera: Conjunctivae normal.     Pupils: Pupils are equal, round, and reactive to light.  Neck:     Thyroid: No thyromegaly or thyroid tenderness.     Vascular: No carotid bruit.  Cardiovascular:     Rate and Rhythm: Normal rate and regular rhythm.     Pulses: Normal pulses.          Radial pulses are 2+ on the right side and 2+ on the left side.     Heart sounds: Normal heart sounds. No murmur.  Pulmonary:     Effort: Pulmonary effort is normal. No respiratory distress.     Breath sounds: Normal breath sounds. No wheezing, rhonchi or rales.  Abdominal:     General: Abdomen is flat. Bowel sounds are normal. There is no distension.     Palpations: Abdomen is soft. There is no mass.     Tenderness: There is no abdominal tenderness. There is no guarding or rebound.     Hernia:  No hernia is present.  Musculoskeletal:        General: Normal range of motion.     Cervical back: Normal range of motion and neck supple.     Right lower leg: No edema.     Left lower leg: No edema.  Lymphadenopathy:     Cervical: No cervical adenopathy.  Skin:    General: Skin is warm and dry.     Findings: No rash.  Neurological:     General: No focal deficit present.     Mental Status: She is alert and oriented to person, place, and time.     Comments: CN grossly intact, station and gait intact  Psychiatric:        Mood and Affect: Mood normal.        Behavior: Behavior normal.  Thought Content: Thought content normal.        Judgment: Judgment normal.       Results for orders placed or performed in visit on 06/27/19  T4, Free  Result Value Ref Range   Free T4 1.13 0.60 - 1.60 ng/dL  TSH  Result Value Ref Range   TSH 1.68 0.35 - 4.50 uIU/mL  Vitamin B12  Result Value Ref Range   Vitamin B-12 1,408 (H) 211 - 911 pg/mL  vit d  Result Value Ref Range   VITD 50.29 30.00 - 100.00 ng/mL  Comprehensive metabolic panel  Result Value Ref Range   Sodium 139 135 - 145 mEq/L   Potassium 4.3 3.5 - 5.1 mEq/L   Chloride 104 96 - 112 mEq/L   CO2 28 19 - 32 mEq/L   Glucose, Bld 88 70 - 99 mg/dL   BUN 16 6 - 23 mg/dL   Creatinine, Ser 0.91 0.40 - 1.20 mg/dL   Total Bilirubin 0.5 0.2 - 1.2 mg/dL   Alkaline Phosphatase 101 39 - 117 U/L   AST 17 0 - 37 U/L   ALT 14 0 - 35 U/L   Total Protein 6.6 6.0 - 8.3 g/dL   Albumin 3.9 3.5 - 5.2 g/dL   GFR 73.69 >60.00 mL/min   Calcium 9.5 8.4 - 10.5 mg/dL  Lipid panel  Result Value Ref Range   Cholesterol 228 (H) 0 - 200 mg/dL   Triglycerides 111.0 0.0 - 149.0 mg/dL   HDL 46.80 >39.00 mg/dL   VLDL 22.2 0.0 - 40.0 mg/dL   LDL Cholesterol 159 (H) 0 - 99 mg/dL   Total CHOL/HDL Ratio 5    NonHDL 181.22    Assessment & Plan:  This visit occurred during the SARS-CoV-2 public health emergency.  Safety protocols were in place,  including screening questions prior to the visit, additional usage of staff PPE, and extensive cleaning of exam room while observing appropriate contact time as indicated for disinfecting solutions.   Problem List Items Addressed This Visit    Vitamin D deficiency    Continue replacement with good effect.       Osteopenia of neck of left femur    Continue cal, vit D and regular weight bearing exercises.       Mild hyperlipidemia    Chronic, stable off meds. Encouraged low chol diet.  The 10-year ASCVD risk score Mikey Bussing DC Brooke Bonito., et al., 2013) is: 12.3%   Values used to calculate the score:     Age: 38 years     Sex: Female     Is Non-Hispanic African American: Yes     Diabetic: No     Tobacco smoker: No     Systolic Blood Pressure: 123456 mmHg     Is BP treated: No     HDL Cholesterol: 46.8 mg/dL     Total Cholesterol: 228 mg/dL       Meralgia paresthetica of right side    S/p neuro eval. Denies tight clothes. Doing well on gabapentin 900mg  daily. Will lower b12 level as recent readings high.       Hyperthyroidism    Chronic, stable TFTs on current regimen. Appreciate endo care, has appt tomorrow.       Health maintenance examination - Primary    Preventative protocols reviewed and updated unless pt declined. Discussed healthy diet and lifestyle.           Meds ordered this encounter  Medications  . vitamin B-12 (V-R VITAMIN  B-12) 500 MCG tablet    Sig: Take 1 tablet (500 mcg total) by mouth daily.    Dispense:      No orders of the defined types were placed in this encounter.   Patient instructions: Consider shingrix vaccine (new 2 shot shingles series), it is a good vaccine.  Decrease b12 replacement to 571mcg daily or 1032mcg a few times a week.  Work on low cholesterol diet.  You are doing well today Return as needed or in 1 year for next physical.   Follow up plan: Return in about 1 year (around 06/28/2020).  Ria Bush, MD

## 2019-06-29 NOTE — Assessment & Plan Note (Signed)
Preventative protocols reviewed and updated unless pt declined. Discussed healthy diet and lifestyle.  

## 2019-06-29 NOTE — Assessment & Plan Note (Addendum)
S/p neuro eval. Denies tight clothes. Doing well on gabapentin 900mg  daily. Will lower b12 level as recent readings high.

## 2019-06-29 NOTE — Patient Instructions (Addendum)
Consider shingrix vaccine (new 2 shot shingles series), it is a good vaccine.  Decrease b12 replacement to 569mcg daily or 109mcg a few times a week.  Work on low cholesterol diet.  You are doing well today Return as needed or in 1 year for next physical.   Health Maintenance After Age 71 After age 92, you are at a higher risk for certain long-term diseases and infections as well as injuries from falls. Falls are a major cause of broken bones and head injuries in people who are older than age 70. Getting regular preventive care can help to keep you healthy and well. Preventive care includes getting regular testing and making lifestyle changes as recommended by your health care provider. Talk with your health care provider about:  Which screenings and tests you should have. A screening is a test that checks for a disease when you have no symptoms.  A diet and exercise plan that is right for you. What should I know about screenings and tests to prevent falls? Screening and testing are the best ways to find a health problem early. Early diagnosis and treatment give you the best chance of managing medical conditions that are common after age 49. Certain conditions and lifestyle choices may make you more likely to have a fall. Your health care provider may recommend:  Regular vision checks. Poor vision and conditions such as cataracts can make you more likely to have a fall. If you wear glasses, make sure to get your prescription updated if your vision changes.  Medicine review. Work with your health care provider to regularly review all of the medicines you are taking, including over-the-counter medicines. Ask your health care provider about any side effects that may make you more likely to have a fall. Tell your health care provider if any medicines that you take make you feel dizzy or sleepy.  Osteoporosis screening. Osteoporosis is a condition that causes the bones to get weaker. This can make the  bones weak and cause them to break more easily.  Blood pressure screening. Blood pressure changes and medicines to control blood pressure can make you feel dizzy.  Strength and balance checks. Your health care provider may recommend certain tests to check your strength and balance while standing, walking, or changing positions.  Foot health exam. Foot pain and numbness, as well as not wearing proper footwear, can make you more likely to have a fall.  Depression screening. You may be more likely to have a fall if you have a fear of falling, feel emotionally low, or feel unable to do activities that you used to do.  Alcohol use screening. Using too much alcohol can affect your balance and may make you more likely to have a fall. What actions can I take to lower my risk of falls? General instructions  Talk with your health care provider about your risks for falling. Tell your health care provider if: ? You fall. Be sure to tell your health care provider about all falls, even ones that seem minor. ? You feel dizzy, sleepy, or off-balance.  Take over-the-counter and prescription medicines only as told by your health care provider. These include any supplements.  Eat a healthy diet and maintain a healthy weight. A healthy diet includes low-fat dairy products, low-fat (lean) meats, and fiber from whole grains, beans, and lots of fruits and vegetables. Home safety  Remove any tripping hazards, such as rugs, cords, and clutter.  Install safety equipment such as grab bars  in bathrooms and safety rails on stairs.  Keep rooms and walkways well-lit. Activity   Follow a regular exercise program to stay fit. This will help you maintain your balance. Ask your health care provider what types of exercise are appropriate for you.  If you need a cane or walker, use it as recommended by your health care provider.  Wear supportive shoes that have nonskid soles. Lifestyle  Do not drink alcohol if your  health care provider tells you not to drink.  If you drink alcohol, limit how much you have: ? 0-1 drink a day for women. ? 0-2 drinks a day for men.  Be aware of how much alcohol is in your drink. In the [place] one drink equals one typical bottle of beer (12 oz), one-half glass of wine (5 oz), or one shot of hard liquor (1 oz).  Do not use any products that contain nicotine or tobacco, such as cigarettes and e-cigarettes. If you need help quitting, ask your health care provider. Summary  Having a healthy lifestyle and getting preventive care can help to protect your health and wellness after age 48.  Screening and testing are the best way to find a health problem early and help you avoid having a fall. Early diagnosis and treatment give you the best chance for managing medical conditions that are more common for people who are older than age 19.  Falls are a major cause of broken bones and head injuries in people who are older than age 87. Take precautions to prevent a fall at home.  Work with your health care provider to learn what changes you can make to improve your health and wellness and to prevent falls. This information is not intended to replace advice given to you by your health care provider. Make sure you discuss any questions you have with your health care provider. Document Released: 05/13/2017 Document Revised: 10/21/2018 Document Reviewed: 05/13/2017 Elsevier Patient Education  2020 [employer].

## 2019-06-29 NOTE — Assessment & Plan Note (Signed)
Continue cal, vit D and regular weight bearing exercises.

## 2019-06-29 NOTE — Assessment & Plan Note (Signed)
Chronic, stable off meds. Encouraged low chol diet.  The 10-year ASCVD risk score Mikey Bussing DC Brooke Bonito., et al., 2013) is: 12.3%   Values used to calculate the score:     Age: 73 years     Sex: Female     Is Non-Hispanic African American: Yes     Diabetic: No     Tobacco smoker: No     Systolic Blood Pressure: 123456 mmHg     Is BP treated: No     HDL Cholesterol: 46.8 mg/dL     Total Cholesterol: 228 mg/dL

## 2019-06-29 NOTE — Assessment & Plan Note (Signed)
Continue replacement with good effect.

## 2019-06-30 ENCOUNTER — Ambulatory Visit: Payer: Federal, State, Local not specified - PPO | Admitting: Endocrinology

## 2019-06-30 ENCOUNTER — Encounter: Payer: Self-pay | Admitting: Endocrinology

## 2019-06-30 VITALS — BP 128/70 | HR 77 | Ht 59.5 in | Wt 152.6 lb

## 2019-06-30 DIAGNOSIS — E890 Postprocedural hypothyroidism: Secondary | ICD-10-CM

## 2019-06-30 NOTE — Progress Notes (Signed)
Patient ID: Kelsey Yoder, female   DOB: [birth year], 73 y.o.   MRN: [id number]                                                                                                               Reason for Appointment:  Post radioactive iodine hypothyroidism, follow-up    Referring physician: Danise Mina    History of Present Illness:   Prior to the consultation she had a history for the prior 2-3 months  of palpitations with her heart racing at times especially at night She was also having ankle swelling but no other typical symptoms of hyperthyroidism including weight loss.  Her labs showed that she had a positive result for thyrotropin receptor antibody measuring 13.5  Since she had continued need for antithyroid drugs she was given I-131 treatment on 10/24/16 with 12 mCi, her uptake was 82% Her methimazole was stopped on her visit on 11/14/16 when her free T4 was 0.71  In 04/2017 she was found to be hypothyroid with free T4 0.53 and was started on levothyroxine 25 g daily  RECENT history:  She has been on levothyroxine supplementation since 04/2017 Her dose has been gradually increased subsequently She usually does not complain of symptoms of unusual fatigue when her TSH is high Thyroid was not palpable after her I-131 treatment  Although 5/20 showed her TSH to be 0.69 subsequently her TSH was suppressed at 0.11 in 9/20 Her dose was continued at 75 mcg with skipping the tablet on Saturdays  Recently not complaining of unusual fatigue, heat or cold intolerance, palpitations or shakiness Her weight is the same recently  She is consistent with taking the levothyroxine consistently before breakfast and her multivitamins and calcium about 6 hours later  TSH is now back to normal at 1.7 No change in free T4 level   Wt Readings from Last 3 Encounters:  06/30/19 152 lb 9.6 oz (69.2 kg)  06/29/19 150 lb 7 oz (68.2 kg)  05/19/19 150 lb 12.8 oz (68.4 kg)    Thyroid levels from  recent labs are:   Lab Results  Component Value Date   TSH 1.68 06/27/2019   TSH 0.11 (L) 03/22/2019   TSH 0.60 11/15/2018   FREET4 1.13 06/27/2019   FREET4 1.13 03/22/2019   FREET4 1.28 11/15/2018          Allergies as of 06/30/2019   No Known Allergies     Medication List       Accurate as of June 30, 2019  3:28 PM. If you have any questions, ask your nurse or doctor.        Calcium Carb-Cholecalciferol 600-800 MG-UNIT Tabs Take 1 tablet by mouth daily.   gabapentin 300 MG capsule Commonly known as: NEURONTIN 1 capsule in the morning, 2 in the evening   levothyroxine 75 MCG tablet Commonly known as: SYNTHROID Take 1 tablet (75 mcg total) by mouth daily before breakfast. Skip dose once a week   multivitamin tablet Take 1 tablet by mouth daily.   OVER  THE COUNTER MEDICATION Take 1 capsule by mouth daily. Tumeric   vitamin B-12 500 MCG tablet Commonly known as: V-R VITAMIN B-12 Take 1 tablet (500 mcg total) by mouth daily.   Vitamin D3 50 MCG (2000 UT) capsule Take 1 capsule by mouth daily.           Past Medical History:  Diagnosis Date  . Aortic valve sclerosis    last echo 04/2009  . 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 Cataract   . Hyperthyroidism 01/07/2016   Dr Dwyane Dee, positive TSHR Ab  . Osteopenia   . PONV (postoperative nausea and vomiting)    post-op nausea  . Sinus drainage     Past Surgical History:  Procedure Laterality Date  . BREAST BIOPSY Right 2010   benign   . BREAST EXCISIONAL BIOPSY Right 2010   benign  . CARPAL TUNNEL RELEASE Right 04/25/2013   CARPAL TUNNEL RELEASE;  Surgeon: Tennis Must, MD;  Location: [facility]  . CATARACT EXTRACTION Bilateral 2013  . COLONOSCOPY  2002   rectosigmoid bulge thought 2/2 fibroids  . COLONOSCOPY  2005   polyps, diverticulosis, rpt 7-10 yrs ([facility])  . COLONOSCOPY  08/2015   HP, mild diverticulosis, rpt 5 yrs  ([facility])  . DENTAL SURGERY     dental  implants  . ESOPHAGOGASTRODUODENOSCOPY  2005   antral gastropathy, neg H pylori screen ([facility])  . TONSILLECTOMY  1976  . TOTAL ABDOMINAL HYSTERECTOMY W/ BILATERAL SALPINGOOPHORECTOMY  2008   h/o abnormal pap smears    Family History  Problem Relation Age of Onset  . Diabetes Mother        double amputee  . Congestive Heart Failure Mother   . Diabetes Father   . Stroke Sister   . Lupus Sister        X 2  . Diabetes Brother   . Cancer Brother        duodenal, prostate   . Stroke Maternal Grandmother   . Stroke Maternal Grandfather   . Breast cancer Sister 78  . Colon cancer Neg Hx   . Colon polyps Neg Hx   . Thyroid disease Neg Hx     Social History:  reports that she has never smoked. She has never used smokeless tobacco. She reports that she does not drink alcohol or use drugs.  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

## 2019-09-03 ENCOUNTER — Ambulatory Visit: Payer: Federal, State, Local not specified - PPO | Attending: Internal Medicine

## 2019-09-03 DIAGNOSIS — Z23 Encounter for immunization: Secondary | ICD-10-CM | POA: Insufficient documentation

## 2019-09-03 NOTE — Progress Notes (Signed)
   Covid-19 Vaccination Clinic  Name:  Kelsey Yoder    MRN: [id number] DOB: 1945/08/12  09/03/2019  Kelsey Yoder was observed post Covid-19 immunization for 15 minutes without incidence. She was provided with Vaccine Information Sheet and instruction to access the V-Safe system.   Kelsey Yoder was instructed to call 911 with any severe reactions post vaccine: Marland Kitchen Difficulty breathing  . Swelling of your face and throat  . A fast heartbeat  . A bad rash all over your body  . Dizziness and weakness    Immunizations Administered    Name Date Dose VIS Date Route   Pfizer COVID-19 Vaccine 09/03/2019 10:51 AM 0.3 mL 06/24/2019 Intramuscular   Manufacturer: [place]   Lot: [id number]   [place]: [id number]

## 2019-09-26 ENCOUNTER — Ambulatory Visit: Payer: Federal, State, Local not specified - PPO | Attending: Internal Medicine

## 2019-09-26 DIAGNOSIS — Z23 Encounter for immunization: Secondary | ICD-10-CM

## 2019-09-26 NOTE — Progress Notes (Signed)
   Covid-19 Vaccination Clinic  Name:  Kelsey Yoder    MRN: [id number] DOB: Jul 05, 1946  09/26/2019  [name] was observed post Covid-19 immunization for 15 minutes without incident. She was provided with Vaccine Information Sheet and instruction to access the V-Safe system.   [name] was instructed to call 911 with any severe reactions post vaccine: Marland Kitchen Difficulty breathing  . Swelling of face and throat  . A fast heartbeat  . A bad rash all over body  . Dizziness and weakness   Immunizations Administered    Name Date Dose VIS Date Route   Pfizer COVID-19 Vaccine 09/26/2019  9:36 AM 0.3 mL 06/24/2019 Intramuscular   Manufacturer: [place]   Lot: [id number]   [place]: [id number]

## 2019-11-15 NOTE — Progress Notes (Signed)
PATIENT: Kelsey Yoder DOB: 10/09/1945  REASON FOR VISIT: follow up HISTORY FROM: patient  HISTORY OF PRESENT ILLNESS: Today 11/16/19  Ms. Cranmer is a 74 year old female with history of numbness and dyskinesias affecting the right anterior lateral aspect of the thigh since January 2020.  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  She remains on gabapentin.  She does not have history of diabetes, reports may be prediabetic.  She is overall doing well.  Her symptoms are 75% better. At night she may feel tingling to her right lateral thigh, but is relieved by propping the leg up.  She is able to fall asleep.  She continues to walk a few miles daily.  She denies any changes to the balance, walking, bowel or bladder, or any new symptoms.  She presents today for follow-up accompanied.  HISTORY  05/19/2019 SS: Ms. Bures is a 74 year old female with history of numbness and dysesthesias affecting the right anterior lateral aspect of the thigh since January 2020.  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  She remains on gabapentin.  She reports some benefit with gabapentin, but she is only taking 600 mg at bedtime.  She indicates her symptoms do not bother her during the day.  There is no pain associated, only burning and tingling to the right anterior lateral aspect of her thigh.  She feels the symptoms at night, mostly when lying on her right side.  She has been dealing with some thyroid issues, has gained about 20 pounds since.  She denies any trouble with walking or balance.  She has not had any falls.  She denies any changes to her bowels or bladder.  She does not have history of diabetes.  She presents today for follow-up unaccompanied.  REVIEW OF SYSTEMS: Out of a complete 14 system review of symptoms, the patient complains only of the following symptoms, and all other reviewed systems are negative.  tingling  ALLERGIES: No Known  Allergies  HOME MEDICATIONS: Outpatient Medications Prior to Visit  Medication Sig Dispense Refill  . Calcium Carb-Cholecalciferol 600-800 MG-UNIT TABS Take 1 tablet by mouth daily.    . Cholecalciferol (VITAMIN D3) 50 MCG (2000 UT) capsule Take 1 capsule by mouth daily.    Marland Kitchen levothyroxine (SYNTHROID) 75 MCG tablet Take 1 tablet (75 mcg total) by mouth daily before breakfast. Skip dose once a week 90 tablet 3  . Multiple Vitamin (MULTIVITAMIN) tablet Take 1 tablet by mouth daily.    Marland Kitchen OVER THE COUNTER MEDICATION Take 1 capsule by mouth daily. Tumeric    . vitamin B-12 (V-R VITAMIN B-12) 500 MCG tablet Take 1 tablet (500 mcg total) by mouth daily.    Marland Kitchen gabapentin (NEURONTIN) 300 MG capsule 1 capsule in the morning, 2 in the evening 90 capsule 6   No facility-administered medications prior to visit.    PAST MEDICAL HISTORY: Past Medical History:  Diagnosis Date  . Aortic valve sclerosis    last echo 04/2009  . 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 Cataract   . Hyperthyroidism 01/07/2016   Dr Dwyane Dee, positive TSHR Ab  . Osteopenia   . PONV (postoperative nausea and vomiting)    post-op nausea  . Sinus drainage     PAST SURGICAL HISTORY: Past Surgical History:  Procedure Laterality Date  . BREAST BIOPSY Right 2010   benign   . BREAST EXCISIONAL BIOPSY Right 2010   benign  . CARPAL TUNNEL RELEASE  Right 04/25/2013   CARPAL TUNNEL RELEASE;  Surgeon: Tennis Must, MD;  Location: [facility]  . CATARACT EXTRACTION Bilateral 2013  . COLONOSCOPY  2002   rectosigmoid bulge thought 2/2 fibroids  . COLONOSCOPY  2005   polyps, diverticulosis, rpt 7-10 yrs ([facility])  . COLONOSCOPY  08/2015   HP, mild diverticulosis, rpt 5 yrs  ([facility])  . DENTAL SURGERY     dental implants  . ESOPHAGOGASTRODUODENOSCOPY  2005   antral gastropathy, neg H pylori screen ([facility])  . TONSILLECTOMY  1976  . TOTAL ABDOMINAL HYSTERECTOMY W/ BILATERAL SALPINGOOPHORECTOMY  2008    h/o abnormal pap smears    FAMILY HISTORY: Family History  Problem Relation Age of Onset  . Diabetes Mother        double amputee  . Congestive Heart Failure Mother   . Diabetes Father   . Stroke Sister   . Lupus Sister        X 2  . Diabetes Brother   . Cancer Brother        duodenal, prostate   . Stroke Maternal Grandmother   . Stroke Maternal Grandfather   . Breast cancer Sister 90  . Colon cancer Neg Hx   . Colon polyps Neg Hx   . Thyroid disease Neg Hx     SOCIAL HISTORY: Social History   Socioeconomic History  . Marital status: Married    Spouse name: Not on file  . Number of children: Not on file  . Years of education: Not on file  . Highest education level: Not on file  Occupational History  . Not on file  Tobacco Use  . Smoking status: Never Smoker  . Smokeless tobacco: Never Used  Substance and Sexual Activity  . Alcohol use: No    Alcohol/week: 0.0 standard drinks  . Drug use: No  . Sexual activity: Yes    Partners: Male    Birth control/protection: Surgical  Other Topics Concern  . Not on file  Social History Narrative   Caffeine: non   Lives alone, no pets   Occupation: retired, was in Armed forces operational officer   Edu: master's   Activity: goes to gym 3-4x/wk   Diet: some water, fruits and vegetables daily   Right handed    Social Determinants of Health   Financial Resource Strain:   . Difficulty of Paying Living Expenses:   Food Insecurity:   . Worried About Charity fundraiser in the Last Year:   . Arboriculturist in the Last Year:   Transportation Needs:   . Film/video editor (Medical):   Marland Kitchen Lack of Transportation (Non-Medical):   Physical Activity:   . Days of Exercise per Week:   . Minutes of Exercise per Session:   Stress:   . Feeling of Stress :   Social Connections:   . Frequency of Communication with Friends and Family:   . Frequency of Social Gatherings with Friends and Family:   . Attends Religious Services:   . Active  Member of Clubs or Organizations:   . Attends Archivist Meetings:   Marland Kitchen Marital Status:   Intimate Partner Violence:   . Fear of Current or Ex-Partner:   . Emotionally Abused:   Marland Kitchen Physically Abused:   . Sexually Abused:     PHYSICAL EXAM  Vitals:   11/16/19 0948  BP: 131/75  Pulse: 69  Temp: (!) 97 F (36.1 C)  Weight: 153 lb (69.4 kg)  Height:  4\' 11"  (1.499 m)   Body mass index is 30.9 kg/m.  Generalized: Well developed, in no acute distress   Neurological examination  Mentation: Alert oriented to time, place, history taking. Follows all commands speech and language fluent Cranial nerve II-XII: Pupils were equal round reactive to light. Extraocular movements were full, visual field were full on confrontational test. Facial sensation and strength were normal. Head turning and shoulder shrug  were normal and symmetric. Motor: The motor testing reveals 5 over 5 strength of all 4 extremities. Good symmetric motor tone is noted throughout.  Sensory: Sensory testing is intact to soft touch on all 4 extremities. No evidence of extinction is noted.  Coordination: Cerebellar testing reveals good finger-nose-finger and heel-to-shin bilaterally.  Gait and station: Gait is normal. Tandem gait is normal. Romberg is negative. No drift is seen.  Reflexes: Deep tendon reflexes are symmetric and normal bilaterally.   DIAGNOSTIC DATA (LABS, IMAGING, TESTING) - I reviewed patient records, labs, notes, testing and imaging myself where available.  Lab Results  Component Value Date   WBC 5.7 09/24/2018   HGB 14.0 09/24/2018   HCT 41.3 09/24/2018   MCV 94.6 09/24/2018   PLT 309.0 09/24/2018      Component Value Date/Time   NA 139 06/27/2019 0814   NA 140 08/13/2011 0000   K 4.3 06/27/2019 0814   K 4.3 08/13/2011 0000   CL 104 06/27/2019 0814   CL 106 08/13/2011 0000   CO2 28 06/27/2019 0814   CO2 21 08/13/2011 0000   GLUCOSE 88 06/27/2019 0814   BUN 16 06/27/2019 0814    BUN 17 08/13/2011 0000   CREATININE 0.91 06/27/2019 0814   CREATININE 0.91 08/13/2011 0000   CALCIUM 9.5 06/27/2019 0814   CALCIUM 9.6 08/13/2011 0000   PROT 6.6 06/27/2019 0814   PROT 6.7 08/13/2011 0000   ALBUMIN 3.9 06/27/2019 0814   ALBUMIN 4.0 08/13/2011 0000   AST 17 06/27/2019 0814   AST 16 08/13/2011 0000   ALT 14 06/27/2019 0814   ALKPHOS 101 06/27/2019 0814   ALKPHOS 89 08/13/2011 0000   BILITOT 0.5 06/27/2019 0814   BILITOT 0.4 08/13/2011 0000   Lab Results  Component Value Date   CHOL 228 (H) 06/27/2019   HDL 46.80 06/27/2019   LDLCALC 159 (H) 06/27/2019   LDLDIRECT 151.4 05/24/2012   TRIG 111.0 06/27/2019   CHOLHDL 5 06/27/2019   No results found for: HGBA1C Lab Results  Component Value Date   VITAMINB12 1,408 (H) 06/27/2019   Lab Results  Component Value Date   TSH 1.68 06/27/2019      ASSESSMENT AND PLAN 74 y.o. year old female  has a past medical history of Aortic valve sclerosis, Carpal tunnel syndrome of right wrist (04/2013), Cataract, Hyperthyroidism (01/07/2016), Osteopenia, PONV (postoperative nausea and vomiting), and Sinus drainage. here with:  1.  Meralgia paresthetica, right  She continues to improve, is 75% better.  She will remain on gabapentin 300 mg in the morning, 600 mg at bedtime.  I encouraged her to continue walking, avoid tight waistbands and belts.  She will continue routine follow-up with her PCP, follow-up here on an as-needed basis, encouraged to reach out if her condition changes or worsens. She says she has been told she is pre-diabetic, but I don't see a recent A1C.  I spent 20 minutes of face-to-face and non-face-to-face time with patient.  This included previsit chart review, lab review, study review, order entry, electronic health record documentation, patient education.  Butler Denmark, AGNP-C, DNP 11/16/2019, 10:08 AM [facility] [address] [place], [place] [address] [phone]

## 2019-11-16 ENCOUNTER — Other Ambulatory Visit: Payer: Self-pay

## 2019-11-16 ENCOUNTER — Encounter: Payer: Self-pay | Admitting: Neurology

## 2019-11-16 ENCOUNTER — Ambulatory Visit: Payer: Federal, State, Local not specified - PPO | Admitting: Neurology

## 2019-11-16 VITALS — BP 131/75 | HR 69 | Temp 97.0°F | Ht 59.0 in | Wt 153.0 lb

## 2019-11-16 DIAGNOSIS — G5711 Meralgia paresthetica, right lower limb: Secondary | ICD-10-CM

## 2019-11-16 MED ORDER — GABAPENTIN 300 MG PO CAPS: ORAL_CAPSULE | ORAL | 6 refills | Status: DC

## 2019-11-16 NOTE — Patient Instructions (Signed)
It was great to see you today! Continue on gabapentin  Continue your daily walking Continue routine visits with primary doctor Return for any new problem or if you condition worsens

## 2019-11-18 NOTE — Progress Notes (Signed)
I have read the note, and I agree with the clinical assessment and plan.  Charles K Willis   

## 2019-12-23 ENCOUNTER — Other Ambulatory Visit: Payer: Self-pay | Admitting: Family Medicine

## 2019-12-23 DIAGNOSIS — Z1231 Encounter for screening mammogram for malignant neoplasm of breast: Secondary | ICD-10-CM

## 2019-12-29 ENCOUNTER — Other Ambulatory Visit: Payer: Self-pay

## 2019-12-29 ENCOUNTER — Encounter: Payer: Self-pay | Admitting: Endocrinology

## 2019-12-29 ENCOUNTER — Ambulatory Visit: Payer: Federal, State, Local not specified - PPO | Admitting: Endocrinology

## 2019-12-29 VITALS — BP 120/60 | HR 68 | Ht 59.0 in | Wt 153.8 lb

## 2019-12-29 DIAGNOSIS — E890 Postprocedural hypothyroidism: Secondary | ICD-10-CM

## 2019-12-29 LAB — T4, FREE: Free T4: 1.16 ng/dL (ref 0.60–1.60)

## 2019-12-29 LAB — TSH: TSH: 0.81 u[IU]/mL (ref 0.35–4.50)

## 2019-12-29 NOTE — Progress Notes (Signed)
Please call to let patient know that the lab results are normal and no change needed, to come back in a year

## 2019-12-29 NOTE — Progress Notes (Signed)
Patient ID: Kelsey Yoder, female   DOB: Sep 26, 1945, 74 y.o.   MRN: [id number]                                                                                                               Reason for Appointment:  Post radioactive iodine hypothyroidism, follow-up    Referring physician: Danise Mina    History of Present Illness:   Prior to the consultation she had a history for the prior 2-3 months  of palpitations with her heart racing at times especially at night She was also having ankle swelling but no other typical symptoms of hyperthyroidism including weight loss.  Her labs showed that she had a positive result for thyrotropin receptor antibody measuring 13.5  Since she had continued need for antithyroid drugs she was given I-131 treatment on 10/24/16 with 12 mCi, her uptake was 82% Her methimazole was stopped on her visit on 11/14/16 when her free T4 was 0.71  In 04/2017 she was found to be hypothyroid with free T4 0.53 and was started on levothyroxine 25 g daily  RECENT history:  She has been on levothyroxine supplementation since 04/2017  She usually does not complain of symptoms of unusual fatigue when her TSH is high  Previously had needed increases in her levothyroxine dose but TSH was suppressed at 0.11 in 9/20  Her dose was continued at 75 mcg with skipping the tablet on Saturdays TSH was back to normal on her last visit  Currently not complaining of unusual fatigue, heat or cold intolerance or weight change Only if she has some insomnia she will have some fatigue  She is consistent with taking the levothyroxine consistently before breakfast and her multivitamins and calcium about 6 hours later  TSH previously was at 1.7 and today has not had any lab results   Wt Readings from Last 3 Encounters:  12/29/19 153 lb 12.8 oz (69.8 kg)  11/16/19 153 lb (69.4 kg)  06/30/19 152 lb 9.6 oz (69.2 kg)    Thyroid levels from recent labs are:   Lab Results    Component Value Date   TSH 1.68 06/27/2019   TSH 0.11 (L) 03/22/2019   TSH 0.60 11/15/2018   FREET4 1.13 06/27/2019   FREET4 1.13 03/22/2019   FREET4 1.28 11/15/2018          Allergies as of 12/29/2019   No Known Allergies     Medication List       Accurate as of December 29, 2019  1:36 PM. If you have any questions, ask your nurse or doctor.        Calcium Carb-Cholecalciferol 600-800 MG-UNIT Tabs Take 1 tablet by mouth daily.   gabapentin 300 MG capsule Commonly known as: NEURONTIN 1 capsule in the morning, 2 in the evening   levothyroxine 75 MCG tablet Commonly known as: SYNTHROID Take 1 tablet (75 mcg total) by mouth daily before breakfast. Skip dose once a week   multivitamin tablet Take 1 tablet by mouth daily.   OVER THE COUNTER  MEDICATION Take 1 capsule by mouth daily. Tumeric   vitamin B-12 500 MCG tablet Commonly known as: V-R VITAMIN B-12 Take 1 tablet (500 mcg total) by mouth daily. What changed: when to take this   Vitamin D3 50 MCG (2000 UT) capsule Take 1 capsule by mouth daily.           Past Medical History:  Diagnosis Date  . Aortic valve sclerosis    last echo 04/2009  . 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 Cataract   . Hyperthyroidism 01/07/2016   Dr Dwyane Dee, positive TSHR Ab  . Osteopenia   . PONV (postoperative nausea and vomiting)    post-op nausea  . Sinus drainage     Past Surgical History:  Procedure Laterality Date  . BREAST BIOPSY Right 2010   benign   . BREAST EXCISIONAL BIOPSY Right 2010   benign  . CARPAL TUNNEL RELEASE Right 04/25/2013   CARPAL TUNNEL RELEASE;  Surgeon: Tennis Must, MD;  Location: [facility]  . CATARACT EXTRACTION Bilateral 2013  . COLONOSCOPY  2002   rectosigmoid bulge thought 2/2 fibroids  . COLONOSCOPY  2005   polyps, diverticulosis, rpt 7-10 yrs ([facility])  . COLONOSCOPY  08/2015   HP, mild diverticulosis, rpt 5 yrs  ([facility])  . DENTAL SURGERY     dental  implants  . ESOPHAGOGASTRODUODENOSCOPY  2005   antral gastropathy, neg H pylori screen ([facility])  . TONSILLECTOMY  1976  . TOTAL ABDOMINAL HYSTERECTOMY W/ BILATERAL SALPINGOOPHORECTOMY  2008   h/o abnormal pap smears    Family History  Problem Relation Age of Onset  . Diabetes Mother        double amputee  . Congestive Heart Failure Mother   . Diabetes Father   . Stroke Sister   . Lupus Sister        X 2  . Diabetes Brother   . Cancer Brother        duodenal, prostate   . Stroke Maternal Grandmother   . Stroke Maternal Grandfather   . Breast cancer Sister 41  . Colon cancer Neg Hx   . Colon polyps Neg Hx   . Thyroid disease Neg Hx     Social History:  reports that she has never smoked. She has never used smokeless tobacco. She reports that she does not drink alcohol and does not use drugs.  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

## 2020-02-02 ENCOUNTER — Other Ambulatory Visit: Payer: Self-pay

## 2020-02-02 ENCOUNTER — Ambulatory Visit
Admission: RE | Admit: 2020-02-02 | Discharge: 2020-02-02 | Disposition: A | Discharge status: Home / Self Care | Payer: Federal, State, Local not specified - PPO | Source: Ambulatory Visit | Attending: Family Medicine | Admitting: Family Medicine

## 2020-02-02 DIAGNOSIS — Z1231 Encounter for screening mammogram for malignant neoplasm of breast: Secondary | ICD-10-CM

## 2020-05-29 ENCOUNTER — Telehealth: Payer: Self-pay | Admitting: Endocrinology

## 2020-05-29 ENCOUNTER — Other Ambulatory Visit: Payer: Self-pay | Admitting: *Deleted

## 2020-05-29 MED ORDER — LEVOTHYROXINE SODIUM 75 MCG PO TABS: 75.0000 ug | ORAL_TABLET | Freq: Every day | ORAL | 1 refills | Status: DC

## 2020-05-29 NOTE — Telephone Encounter (Signed)
Rx sent 

## 2020-05-29 NOTE — Telephone Encounter (Signed)
Patient states she is at the [facility] needing a RX for levothyroxine (SYNTHROID) 75 MCG tablet to be sent asap (Patient is completely out of the medication listed above) to  [place], [place] Phone:  [phone]  Fax:  [phone]     The above PHARM told patient they sent a RX request for the above last week and again today.

## 2020-06-13 ENCOUNTER — Encounter (HOSPITAL_COMMUNITY): Payer: Self-pay | Admitting: Emergency Medicine

## 2020-06-13 ENCOUNTER — Emergency Department (HOSPITAL_COMMUNITY): Payer: Federal, State, Local not specified - PPO

## 2020-06-13 ENCOUNTER — Emergency Department (HOSPITAL_COMMUNITY)
Admission: EM | Admit: 2020-06-13 | Discharge: 2020-06-14 | Disposition: A | Discharge status: Home / Self Care | Payer: Federal, State, Local not specified - PPO | Attending: Emergency Medicine | Admitting: Emergency Medicine

## 2020-06-13 ENCOUNTER — Other Ambulatory Visit: Payer: Self-pay

## 2020-06-13 ENCOUNTER — Telehealth: Payer: Self-pay

## 2020-06-13 DIAGNOSIS — R0789 Other chest pain: Secondary | ICD-10-CM

## 2020-06-13 DIAGNOSIS — R002 Palpitations: Secondary | ICD-10-CM

## 2020-06-13 DIAGNOSIS — R079 Chest pain, unspecified: Secondary | ICD-10-CM | POA: Diagnosis present

## 2020-06-13 LAB — CBC
HCT: 42.4 % (ref 36.0–46.0)
Hemoglobin: 13.8 g/dL (ref 12.0–15.0)
MCH: 31.1 pg (ref 26.0–34.0)
MCHC: 32.5 g/dL (ref 30.0–36.0)
MCV: 95.5 fL (ref 80.0–100.0)
Platelets: 322 10*3/uL (ref 150–400)
RBC: 4.44 MIL/uL (ref 3.87–5.11)
RDW: 12.8 % (ref 11.5–15.5)
WBC: 6.3 10*3/uL (ref 4.0–10.5)
nRBC: 0 % (ref 0.0–0.2)

## 2020-06-13 LAB — BASIC METABOLIC PANEL
Anion gap: 11 (ref 5–15)
BUN: 11 mg/dL (ref 8–23)
CO2: 26 mmol/L (ref 22–32)
Calcium: 9.3 mg/dL (ref 8.9–10.3)
Chloride: 105 mmol/L (ref 98–111)
Creatinine, Ser: 0.95 mg/dL (ref 0.44–1.00)
GFR, Estimated: >60 mL/min (ref >60)
Glucose, Bld: 108 mg/dL — ABNORMAL HIGH (ref 70–99)
Potassium: 3.7 mmol/L (ref 3.5–5.1)
Sodium: 142 mmol/L (ref 135–145)

## 2020-06-13 LAB — TROPONIN I (HIGH SENSITIVITY)
Troponin I (High Sensitivity): 3 ng/L (ref <18)
Troponin I (High Sensitivity): <2 ng/L (ref <18)

## 2020-06-13 NOTE — ED Notes (Signed)
Called 2X for triage w/o response

## 2020-06-13 NOTE — Telephone Encounter (Signed)
I went and talked to Mrs. Isley about this case.  This is a very concerning history.  Intermittent chest pain lasting multiple minutes at a time repetitively as well as intermittent tachycardia lasting minutes could be severe life-threatening problems.  Concern for potential unstable angina.  I have very strongly recommend that the patient go to the emergency room today and now for evaluation.  These issues described could certainly lead to sudden cardiac death if not fully evaluated.

## 2020-06-13 NOTE — ED Triage Notes (Signed)
Pt here from home with c/o left sided chest pain  Felt like her heart was racing last night , pain has been ongoing for about 3 weeks

## 2020-06-13 NOTE — Telephone Encounter (Signed)
Pt said on and off for 3 wks pt has had dull left sided CP that last for 1 - 2 mins. Sometimes pt has CP and fast heart beat and other times pt will have fast heart beat that might last 1 - 5 mins per episode without CP.  Last episode occurred last night for 1 -2  mins of both dull lt sided CP and fast heart beat; pt does not know how to ck pulse but said heart sounds like it is beating fast when episode occurs. In last 24 hrs had 1 - 2 mins of dull lt sided CP once or twice and fast heart beat for 1 - 5 mins only one time. Pt said she has numbness in lt hand that has been going on since 2018-2019 when saw specialist and was given " a sock to wear"; pt had already had carpal tunnel surgery on rt hand prior to lt hand bothering pt. Pt said lt hand is worsening with numbness and grip in that hand. Pt does not have problems walking but if sits in chair where feet do not touch floor pt has tingling in feet. No SOB,H/A or dizziness. Pt does not have radiation of CP.pt said couple of years ago had overactive thyroid and had fast heart rate on and off but no CP. Pt said Dr Dwyane Dee took care of thyroid issue.  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 Pt wants appt at [facility]; No available appts at [facility] this afternoon and Dr Darnell Level (PCP) is off on 06/14/20. Pt scheduled in office (no covid symptoms) on 06/14/20 at 9:20 with Dr Lorelei Pont. Sending note to Dr Darnell Level as PCP and Dr Lorelei Pont. UC & ED precautions given and pt voiced understanding.

## 2020-06-13 NOTE — Telephone Encounter (Signed)
Dr Lorelei Pont is concerned about pts condition with CP and fast heart beat and said pt should be evaluated today at ED.I spoke with pt and pt refuses to go to hospital ED. I explained to pt that Dr Lorelei Pont is concerned with the symptoms pt is having and what could possibly be causing the symptoms on and off for 2 - 3 wks. Dr Lorelei Pont wants to make sure that pt is aware and understands the risk of death with these type symptoms.After reviewing why concerned with pts symptoms and condition and pt finally said that she cannot leave her home with workers there; pt will see if her husband can come home to be with workers and a friend will take pt to [facility] UC at [facility] or [facility] ED. Pt said that we sound serious about what is going on with pt and I advised pt that the doctors are concerned for pts well being and giving the best advise possible. Pt said OK and said to cancel the appt with Dr Lorelei Pont on 06/14/20 and pt will go either to UC or ED today. FYI to Dr Darnell Level and Dr Lorelei Pont.

## 2020-06-13 NOTE — Telephone Encounter (Signed)
Noted. Will await [facility] eval.  plz call tomorrow for update, schedule OV with me may place 12:30pm Friday.

## 2020-06-14 ENCOUNTER — Other Ambulatory Visit: Payer: Self-pay

## 2020-06-14 ENCOUNTER — Ambulatory Visit: Payer: Federal, State, Local not specified - PPO | Admitting: Family Medicine

## 2020-06-14 LAB — T4, FREE: Free T4: 0.97 ng/dL (ref 0.61–1.12)

## 2020-06-14 LAB — TSH: TSH: 6.117 u[IU]/mL — ABNORMAL HIGH (ref 0.350–4.500)

## 2020-06-14 NOTE — ED Provider Notes (Signed)
[facility] EMERGENCY DEPARTMENT Provider Note   CSN: [id number] Arrival date & time: 06/13/20  1512     History Chief Complaint  Patient presents with  . Chest Pain    Kelsey Yoder is a 74 y.o. female.  HPI  HPI: A 74 year old patient with a history of hypercholesterolemia presents for evaluation of chest pain. Initial onset of pain was more than 6 hours ago. The patient's chest pain is well-localized, is described as heaviness/pressure/tightness and is not worse with exertion. The patient's chest pain is middle- or left-sided, is not sharp and does not radiate to the arms/jaw/neck. The patient does not complain of nausea and denies diaphoresis. The patient has no history of stroke, has no history of peripheral artery disease, has not smoked in the past 90 days, denies any history of treated diabetes, has no relevant family history of coronary artery disease (first degree relative at less than age 75), is not hypertensive and does not have an elevated BMI (>=30).    74 year old female with a history of aortic valve sclerosis, post ablative hypothyroidism, who presents to the emergency department today for evaluation of palpitations and chest discomfort.  Patient states she woke up last night and felt like her heart was pounding.  This lasted for about 5 minutes and subsided after she completed some deep breathing.  The palpitations are currently resolved.  She has not had similar symptoms before.  Additionally she notes she has been having intermittent left-sided chest pain for the last few weeks.  The episodes last for a few minutes at a time and resolve on their own.  She describes it as an aching pain that feels like a pulled muscle.  She sometimes has the pain when she does exercises with lifting her arm or vacuums in her home.  However she states she walks about 4 to 5 miles per day and does not get any chest pain with this activity.  She denies any associated  shortness of breath, nausea, diaphoresis.  Denies any early family history of heart disease, tobacco use, diabetes, hypertension.  She does have a history of hyperlipidemia but usually this is controlled by diet and lifestyle changes.  Past Medical History:  Diagnosis Date  . Aortic valve sclerosis    last echo 04/2009  . 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 Cataract   . Hyperthyroidism 01/07/2016   Dr Dwyane Dee, positive TSHR Ab  . Osteopenia   . PONV (postoperative nausea and vomiting)    post-op nausea  . Sinus drainage     Patient Active Problem List   Diagnosis Date Noted  . Health maintenance examination 06/29/2019  . Meralgia paresthetica of right side 09/24/2018  . Skin rash 08/12/2018  . 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 Hyperthyroidism 01/07/2016  . 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 Insomnia 06/05/2015  . Advanced care planning/counseling discussion 06/02/2014  . Achilles tendonitis 03/04/2013  . Encounter for general adult medical examination with abnormal findings 05/27/2012  . Aortic valve sclerosis   . Vitamin D deficiency   . Mild hyperlipidemia   . Osteopenia of neck of left femur     Past Surgical History:  Procedure Laterality Date  . BREAST BIOPSY Right 2010   benign   . BREAST EXCISIONAL BIOPSY Right 2010   benign  . CARPAL TUNNEL RELEASE Right 04/25/2013   CARPAL TUNNEL RELEASE;  Surgeon: Tennis Must, MD;  Location: [facility]  . CATARACT EXTRACTION Bilateral 2013  .  COLONOSCOPY  2002   rectosigmoid bulge thought 2/2 fibroids  . COLONOSCOPY  2005   polyps, diverticulosis, rpt 7-10 yrs ([facility])  . COLONOSCOPY  08/2015   HP, mild diverticulosis, rpt 5 yrs  ([facility])  . DENTAL SURGERY     dental implants  . ESOPHAGOGASTRODUODENOSCOPY  2005   antral gastropathy, neg H pylori screen ([facility])  . TONSILLECTOMY  1976  . TOTAL ABDOMINAL HYSTERECTOMY W/ BILATERAL SALPINGOOPHORECTOMY  2008   h/o abnormal pap  smears     OB History    Gravida  0   Para  0   Term  0   Preterm  0   AB  0   Living  0     SAB  0   TAB  0   Ectopic  0   Multiple  0   Live Births              Family History  Problem Relation Age of Onset  . Diabetes Mother        double amputee  . Congestive Heart Failure Mother   . Diabetes Father   . Stroke Sister   . Lupus Sister        X 2  . Diabetes Brother   . Cancer Brother        duodenal, prostate   . Stroke Maternal Grandmother   . Stroke Maternal Grandfather   . Breast cancer Sister 80  . Colon cancer Neg Hx   . Colon polyps Neg Hx   . Thyroid disease Neg Hx     Social History   Tobacco Use  . Smoking status: Never Smoker  . Smokeless tobacco: Never Used  Vaping Use  . Vaping Use: Never used  Substance Use Topics  . Alcohol use: No    Alcohol/week: 0.0 standard drinks  . Drug use: No    Home Medications Prior to Admission medications   Medication Sig Start Date End Date Taking? Authorizing Provider  Calcium Carb-Cholecalciferol 600-800 MG-UNIT TABS Take 1 tablet by mouth daily.    [provider]  Cholecalciferol (VITAMIN D3) 50 MCG (2000 UT) capsule Take 1 capsule by mouth daily. 05/14/17   [provider]  gabapentin (NEURONTIN) 300 MG capsule 1 capsule in the morning, 2 in the evening 11/16/19   Suzzanne Cloud, NP  levothyroxine (SYNTHROID) 75 MCG tablet Take 1 tablet (75 mcg total) by mouth daily before breakfast. Skip dose once a week 05/29/20   [name], MD  Multiple Vitamin (MULTIVITAMIN) tablet Take 1 tablet by mouth daily.    [provider]  OVER THE COUNTER MEDICATION Take 1 capsule by mouth daily. Tumeric    [provider]  vitamin B-12 (V-R VITAMIN B-12) 500 MCG tablet Take 1 tablet (500 mcg total) by mouth daily. Patient taking differently: Take 500 mcg by mouth every other day.  06/29/19   Ria Bush, MD    Allergies    Patient has no known allergies.  Review of  Systems   Review of Systems  Constitutional: Negative for chills and fever.  HENT: Negative for ear pain and sore throat.   Eyes: Negative for pain and visual disturbance.  Respiratory: Negative for cough and shortness of breath.   Cardiovascular: Positive for chest pain and palpitations. Negative for leg swelling.  Gastrointestinal: Negative for abdominal pain, constipation, diarrhea, nausea and vomiting.  Genitourinary: Negative for dysuria and hematuria.  Musculoskeletal: Negative for back pain.  Skin: Negative for color change.  Neurological: Negative for headaches.  All other systems reviewed and are negative.   Physical Exam Updated Vital Signs BP 138/71 (BP Location: Right Arm)   Pulse 63   Temp (!) 97.3 F (36.3 C)   Resp 18   Ht 4' 11.5" (1.511 m)   Wt 64.9 kg   SpO2 100%   BMI 28.40 kg/m   Physical Exam Vitals and nursing note reviewed.  Constitutional:      General: She is not in acute distress.    Appearance: She is well-developed.  HENT:     Head: Normocephalic and atraumatic.  Eyes:     Conjunctiva/sclera: Conjunctivae normal.  Cardiovascular:     Rate and Rhythm: Normal rate and regular rhythm.     Heart sounds: No murmur heard.   Pulmonary:     Effort: Pulmonary effort is normal. No respiratory distress.     Breath sounds: Normal breath sounds. No decreased breath sounds, wheezing, rhonchi or rales.  Chest:     Chest wall: No tenderness.  Abdominal:     Palpations: Abdomen is soft.     Tenderness: There is no abdominal tenderness.  Musculoskeletal:     Cervical back: Neck supple.     Right lower leg: No tenderness. No edema.     Left lower leg: No edema.  Skin:    General: Skin is warm and dry.  Neurological:     Mental Status: She is alert.     ED Results / Procedures / Treatments   Labs (all labs ordered are listed, but only abnormal results are displayed) Labs Reviewed  BASIC METABOLIC PANEL - Abnormal; Notable for the following  components:      Result Value   Glucose, Bld 108 (*)    All other components within normal limits  TSH - Abnormal; Notable for the following components:   TSH 6.117 (*)    All other components within normal limits  CBC  T4, FREE  T3, FREE  TROPONIN I (HIGH SENSITIVITY)  TROPONIN I (HIGH SENSITIVITY)    EKG EKG Interpretation  Date/Time:  Wednesday June 13 2020 16:05:14 EST Ventricular Rate:  71 PR Interval:  174 QRS Duration: 88 QT Interval:  364 QTC Calculation: 395 R Axis:   -15 Text Interpretation: Normal sinus rhythm Normal ECG No old tracing to compare Confirmed by Addison Lank [id number]) on 06/14/2020 1:20:14 AM   Radiology DG Chest 2 View  Result Date: 06/13/2020 CLINICAL DATA:  Chest pain EXAM: CHEST - 2 VIEW COMPARISON:  None. FINDINGS: The heart size and mediastinal contours are within normal limits. Senescent lung changes without focal consolidation. No pneumothorax or pleural effusion. Thoracic spondylosis. Degenerative changes bilateral AC joints. IMPRESSION: No acute cardiopulmonary process. Electronically Signed   By: Dahlia Bailiff MD   On: 06/13/2020 16:40    Procedures Procedures (including critical care time)  Medications Ordered in ED Medications - No data to display  ED Course  I have reviewed the triage vital signs and the nursing notes.  Pertinent labs & imaging results that were available during my care of the patient were reviewed by me and considered in my medical decision making (see chart for details).    MDM Rules/Calculators/A&P HEAR Score: 69                        74 year old female presenting for evaluation of palpitations and chest pain.  Episode of palpitations occurred last night and lasted for about 5 minutes.  Chest pain on and off for the last 3 weeks.  It is not exertional in nature and seems atypical.  Reviewed/interpreted w/u CBC, BMP, unremakable Delta trops are negative  EKG - with NSR, no acute ischemic changes,  arrhythmia or STEMI  CXR reviewed/interpreted - No acute cardiopulmonary process.  Have low suspicion for ACS at this time. Heart score is 3 and sxs seen atypical. Given palpitations, TSH added to assess for thyroid dysfunction and is pending at the time of discharge. Doubt critical thyrotoxicosis but pt concern about thyroid levels and she had palpitations when hyperthyroid in the past. Advised to f/u with pcp on results of this. Will also refer to cardiology given her palpitations. She may benefit from holter or zio patch. Advised on close monitoring of sxs and strict return precautions. She voices understanding of the plan and reasons to return. All questions answered, pt stable for d/c.   Pt seen in conjunction with Dr. Leonette Monarch who personally evaluated the patient and is in agreement with the plan.   Final Clinical Impression(s) / ED Diagnoses Final diagnoses:  Atypical chest pain  Palpitations    Rx / DC Orders ED Discharge Orders         Ordered    Ambulatory referral to Cardiology        06/14/20 0303           Bishop Dublin 06/14/20 0650    Fatima Blank, MD 06/14/20 [phone]

## 2020-06-14 NOTE — Discharge Instructions (Addendum)
Your thyroid test was abnormal and there have been some follow up labs added that are still in process and will need to be followed up by your regular doctor.   Your labs were otherwise reassuring.   You will need to make an appointment to follow up with your regular doctor next week for reassessment. Additionally, you were given an ambulatory referral to cardiology. They will reach out to you to schedule an appointment for follow up. Please monitor symptoms closely. Return to the emergency department for any new or worsening symptoms.

## 2020-06-14 NOTE — ED Notes (Signed)
Pt given verbal and written d/c instructions, verbalized understanding of instructions given, ambulatory to pov for d/c

## 2020-06-14 NOTE — Telephone Encounter (Signed)
No need to see me tomorrow if seeing cardiology Monday.  Thanks

## 2020-06-14 NOTE — Telephone Encounter (Addendum)
Spoke with pt asking for an update.  States she is doing fine.  Says she has this type of episode every now and then.  Says the discomfort feels more like a pull than a pain.  However, she has a f/u with cards on Mon, 06/18/20.   Do you still want OV scheduled for tomorrow?

## 2020-06-15 LAB — T3, FREE: T3, Free: 2.4 pg/mL (ref 2.0–4.4)

## 2020-06-15 NOTE — Telephone Encounter (Signed)
Noted  

## 2020-06-25 ENCOUNTER — Other Ambulatory Visit: Payer: Self-pay

## 2020-06-25 ENCOUNTER — Encounter: Payer: Self-pay | Admitting: Cardiology

## 2020-06-25 ENCOUNTER — Ambulatory Visit: Payer: Federal, State, Local not specified - PPO | Admitting: Cardiology

## 2020-06-25 VITALS — BP 138/72 | HR 72 | Ht 59.5 in | Wt 146.4 lb

## 2020-06-25 DIAGNOSIS — E7800 Pure hypercholesterolemia, unspecified: Secondary | ICD-10-CM

## 2020-06-25 DIAGNOSIS — R002 Palpitations: Secondary | ICD-10-CM

## 2020-06-25 NOTE — Progress Notes (Signed)
Cardiology Office Note:    Date:  06/25/2020   ID:  ZAMZAM WHINERY, DOB 09-06-1945, MRN [id number]  PCP:  Ria Bush, MD  [facility] Cardiologist:  Kate Sable, MD  [place] Electrophysiologist:  None   Referring MD: Rodney Booze, PA-C   Chief Complaint  Patient presents with  . Other    Chest pain/Palpitations. Meds reviewed verbally with pt.   Kelsey Yoder is a 74 y.o. female who is being seen today for the evaluation of chest pain/palpitations at the request of Couture, Cortni S, PA-C.   History of Present Illness:    Kelsey Yoder is a 74 y.o. female with a hx of hypothyroidism, hyperlipidemia, who presents due to palpitations.  She states having an episode of increased heart rates 2 weeks ago while at home.  Symptoms lasted a few seconds and resolved after she took some deep breaths.  Has not had any episodes since.  Previous episode was when she was hyper thyroid.  She has since undergone radiation therapy and currently hypothyroid.  Takes Synthroid.  She called her primary care physician office and was recommended to go to the emergency room.  Also endorses left wrist numbness which she attributes to her carpal tunnel syndrome.  She denies chest pain, shortness of breath, dizziness, edema.  Has no history of heart disease.  in the ED 06/14/2020 .  EKG with no evidence for ischemia.  Serial troponins normal.  Etiology deemed noncardiac, patient discharged with outpatient follow-up.  Had an outside echo 04/2019 where EF was normal, 59%, aortic valve sclerosis noted.  Has annual physical exam with primary care provider next month.  Lab work including lipid panel is usually measured.  Past Medical History:  Diagnosis Date  . Aortic valve sclerosis    last echo 04/2009  . 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 Cataract   . Hyperthyroidism 01/07/2016   Dr Dwyane Dee, positive TSHR Ab  . Osteopenia   . PONV (postoperative nausea  and vomiting)    post-op nausea  . Sinus drainage     Past Surgical History:  Procedure Laterality Date  . BREAST BIOPSY Right 2010   benign   . BREAST EXCISIONAL BIOPSY Right 2010   benign  . CARPAL TUNNEL RELEASE Right 04/25/2013   CARPAL TUNNEL RELEASE;  Surgeon: Tennis Must, MD;  Location: [facility]  . CATARACT EXTRACTION Bilateral 2013  . COLONOSCOPY  2002   rectosigmoid bulge thought 2/2 fibroids  . COLONOSCOPY  2005   polyps, diverticulosis, rpt 7-10 yrs ([facility])  . COLONOSCOPY  08/2015   HP, mild diverticulosis, rpt 5 yrs  ([facility])  . DENTAL SURGERY     dental implants  . ESOPHAGOGASTRODUODENOSCOPY  2005   antral gastropathy, neg H pylori screen ([facility])  . TONSILLECTOMY  1976  . TOTAL ABDOMINAL HYSTERECTOMY W/ BILATERAL SALPINGOOPHORECTOMY  2008   h/o abnormal pap smears    Current Medications: Current Meds  Medication Sig  . Calcium Carb-Cholecalciferol 600-800 MG-UNIT TABS Take 1 tablet by mouth daily.  . Cholecalciferol (VITAMIN D3) 50 MCG (2000 UT) capsule Take 1 capsule by mouth daily.  Marland Kitchen ELDERBERRY PO Take by mouth daily at 12 noon.  Marland Kitchen levothyroxine (SYNTHROID) 75 MCG tablet Take 1 tablet (75 mcg total) by mouth daily before breakfast. Skip dose once a week  . Multiple Vitamin (MULTIVITAMIN) tablet Take 1 tablet by mouth daily.  . Multiple Vitamins-Minerals (ZINC PO) Take by mouth daily at 6 (  six) AM.  . OVER THE COUNTER MEDICATION Take 1 capsule by mouth daily. Tumeric     Allergies:   Patient has no known allergies.   Social History   Socioeconomic History  . Marital status: Married    Spouse name: Not on file  . Number of children: Not on file  . Years of education: Not on file  . Highest education level: Not on file  Occupational History  . Not on file  Tobacco Use  . Smoking status: Never Smoker  . Smokeless tobacco: Never Used  Vaping Use  . Vaping Use: Never used  Substance and Sexual Activity  . Alcohol use: No     Alcohol/week: 0.0 standard drinks  . Drug use: No  . Sexual activity: Yes    Partners: Male    Birth control/protection: Surgical  Other Topics Concern  . Not on file  Social History Narrative   Caffeine: non   Lives alone, no pets   Occupation: retired, was in Armed forces operational officer   Edu: Brewing technologist   Activity: goes to gym 3-4x/wk   Diet: some water, fruits and vegetables daily   Right handed    Social Determinants of Radio broadcast assistant Strain: Not on file  Food Insecurity: Not on file  Transportation Needs: Not on file  Physical Activity: Not on file  Stress: Not on file  Social Connections: Not on file     Family History: The patient's family history includes Breast cancer (age of onset: 75) in her sister; Cancer in her brother; Congestive Heart Failure in her mother; Diabetes in her brother, father, and mother; Lupus in her sister; Stroke in her maternal grandfather, maternal grandmother, and sister. There is no history of Colon cancer, Colon polyps, or Thyroid disease.  ROS:   Please see the history of present illness.     All other systems reviewed and are negative.  EKGs/Labs/Other Studies Reviewed:    The following studies were reviewed today:   EKG:  EKG is  ordered today.  The ekg ordered today demonstrates sinus rhythm, normal ECG  Recent Labs: 06/27/2019: ALT 14 06/13/2020: BUN 11; Creatinine, Ser 0.95; Hemoglobin 13.8; Platelets 322; Potassium 3.7; Sodium 142 06/14/2020: TSH 6.117  Recent Lipid Panel    Component Value Date/Time   CHOL 228 (H) 06/27/2019 0814   CHOL 208 08/13/2011 0000   TRIG 111.0 06/27/2019 0814   TRIG 128 08/13/2011 0000   HDL 46.80 06/27/2019 0814   CHOLHDL 5 06/27/2019 0814   VLDL 22.2 06/27/2019 0814   LDLCALC 159 (H) 06/27/2019 0814   LDLDIRECT 151.4 05/24/2012 0819     Risk Assessment/Calculations:      Physical Exam:    VS:  BP 138/72 (BP Location: Right Arm, Patient Position: Sitting, Cuff Size: Normal)    Pulse 72   Ht 4' 11.5" (1.511 m)   Wt 146 lb 6 oz (66.4 kg)   SpO2 98%   BMI 29.07 kg/m     Wt Readings from Last 3 Encounters:  06/25/20 146 lb 6 oz (66.4 kg)  06/13/20 143 lb (64.9 kg)  12/29/19 153 lb 12.8 oz (69.8 kg)     GEN:  Well nourished, well developed in no acute distress HEENT: Normal NECK: No JVD; No carotid bruits LYMPHATICS: No lymphadenopathy CARDIAC: RRR, no murmurs, rubs, gallops RESPIRATORY:  Clear to auscultation without rales, wheezing or rhonchi  ABDOMEN: Soft, non-tender, non-distended MUSCULOSKELETAL:  No edema; No deformity  SKIN: Warm and dry NEUROLOGIC:  Alert and  oriented x 3 PSYCHIATRIC:  Normal affect   ASSESSMENT:    1. Palpitations   2. Pure hypercholesterolemia    PLAN:    In order of problems listed above:  1. Patient with an episode of palpitations, lasting a few seconds.  Denies chest pain or shortness of breath.  Will not perform testing at this point as symptoms involve one incident occurring 2 weeks ago.  If symptoms were to recur, and or become persistent will consider cardiac monitor. 2. Hyperlipidemia, lab work from a year ago shows 10-year ASCVD risk of 14%.  Repeat lab work with primary care provider as planned next month.  Patient plans to follow-up with primary care provider regarding statin therapy if needed.  Recommend statin therapy if ASCVD risk is over 7.5%.  Follow-up as needed    Medication Adjustments/Labs and Tests Ordered: Current medicines are reviewed at length with the patient today.  Concerns regarding medicines are outlined above.  Orders Placed This Encounter  Procedures  . EKG 12-Lead   No orders of the defined types were placed in this encounter.   Patient Instructions  Medication Instructions:  Your physician recommends that you continue on your current medications as directed. Please refer to the Current Medication list given to you today.  *If you need a refill on your cardiac medications before  your next appointment, please call your pharmacy*  Follow-Up: At [facility], you and your health needs are our priority.  As part of our continuing mission to provide you with exceptional heart care, we have created designated Provider Care Teams.  These Care Teams include your primary Cardiologist (physician) and Advanced Practice Providers (APPs -  Physician Assistants and Nurse Practitioners) who all work together to provide you with the care you need, when you need it.  We recommend signing up for the patient portal called "MyChart".  Sign up information is provided on this After Visit Summary.  MyChart is used to connect with patients for Virtual Visits (Telemedicine).  Patients are able to view lab/test results, encounter notes, upcoming appointments, etc.  Non-urgent messages can be sent to your provider as well.   To learn more about what you can do with MyChart, go to [email or web address].    Your next appointment:   As needed.     Signed, Kate Sable, MD  06/25/2020 4:44 PM    [facility]

## 2020-06-25 NOTE — Patient Instructions (Signed)

## 2020-06-28 ENCOUNTER — Other Ambulatory Visit: Payer: Self-pay | Admitting: Family Medicine

## 2020-06-28 DIAGNOSIS — E890 Postprocedural hypothyroidism: Secondary | ICD-10-CM

## 2020-06-28 DIAGNOSIS — E785 Hyperlipidemia, unspecified: Secondary | ICD-10-CM

## 2020-06-28 DIAGNOSIS — E559 Vitamin D deficiency, unspecified: Secondary | ICD-10-CM

## 2020-06-29 ENCOUNTER — Other Ambulatory Visit: Payer: Self-pay

## 2020-06-29 ENCOUNTER — Other Ambulatory Visit (INDEPENDENT_AMBULATORY_CARE_PROVIDER_SITE_OTHER): Payer: Federal, State, Local not specified - PPO

## 2020-06-29 DIAGNOSIS — E785 Hyperlipidemia, unspecified: Secondary | ICD-10-CM

## 2020-06-29 DIAGNOSIS — E559 Vitamin D deficiency, unspecified: Secondary | ICD-10-CM

## 2020-06-29 DIAGNOSIS — E890 Postprocedural hypothyroidism: Secondary | ICD-10-CM

## 2020-06-29 LAB — LIPID PANEL
Cholesterol: 218 mg/dL — ABNORMAL HIGH (ref 0–200)
HDL: 55.3 mg/dL (ref >39.00)
LDL Cholesterol: 147 mg/dL — ABNORMAL HIGH (ref 0–99)
NonHDL: 162.84
Total CHOL/HDL Ratio: 4
Triglycerides: 80 mg/dL (ref 0.0–149.0)
VLDL: 16 mg/dL (ref 0.0–40.0)

## 2020-06-29 LAB — TSH: TSH: 4.47 u[IU]/mL (ref 0.35–4.50)

## 2020-06-29 LAB — T4, FREE: Free T4: 0.88 ng/dL (ref 0.60–1.60)

## 2020-06-29 LAB — COMPREHENSIVE METABOLIC PANEL
ALT: 15 U/L (ref 0–35)
AST: 18 U/L (ref 0–37)
Albumin: 3.9 g/dL (ref 3.5–5.2)
Alkaline Phosphatase: 115 U/L (ref 39–117)
BUN: 14 mg/dL (ref 6–23)
CO2: 30 mEq/L (ref 19–32)
Calcium: 9.4 mg/dL (ref 8.4–10.5)
Chloride: 107 mEq/L (ref 96–112)
Creatinine, Ser: 0.9 mg/dL (ref 0.40–1.20)
GFR: 63.99 mL/min (ref >60.00)
Glucose, Bld: 90 mg/dL (ref 70–99)
Potassium: 4.2 mEq/L (ref 3.5–5.1)
Sodium: 142 mEq/L (ref 135–145)
Total Bilirubin: 0.5 mg/dL (ref 0.2–1.2)
Total Protein: 6.7 g/dL (ref 6.0–8.3)

## 2020-06-29 LAB — VITAMIN D 25 HYDROXY (VIT D DEFICIENCY, FRACTURES): VITD: 47.74 ng/mL (ref 30.00–100.00)

## 2020-07-10 ENCOUNTER — Other Ambulatory Visit: Payer: Self-pay

## 2020-07-10 ENCOUNTER — Ambulatory Visit (INDEPENDENT_AMBULATORY_CARE_PROVIDER_SITE_OTHER): Payer: Federal, State, Local not specified - PPO | Admitting: Family Medicine

## 2020-07-10 ENCOUNTER — Encounter: Payer: Self-pay | Admitting: Family Medicine

## 2020-07-10 VITALS — BP 116/60 | HR 82 | Temp 97.7°F | Ht 59.5 in | Wt 146.1 lb

## 2020-07-10 DIAGNOSIS — M85852 Other specified disorders of bone density and structure, left thigh: Secondary | ICD-10-CM | POA: Diagnosis not present

## 2020-07-10 DIAGNOSIS — G5711 Meralgia paresthetica, right lower limb: Secondary | ICD-10-CM

## 2020-07-10 DIAGNOSIS — E890 Postprocedural hypothyroidism: Secondary | ICD-10-CM

## 2020-07-10 DIAGNOSIS — E785 Hyperlipidemia, unspecified: Secondary | ICD-10-CM | POA: Diagnosis not present

## 2020-07-10 DIAGNOSIS — Z23 Encounter for immunization: Secondary | ICD-10-CM | POA: Diagnosis not present

## 2020-07-10 DIAGNOSIS — Z0000 Encounter for general adult medical examination without abnormal findings: Secondary | ICD-10-CM | POA: Diagnosis not present

## 2020-07-10 MED ORDER — TURMERIC 500 MG PO CAPS: 1.0000 | ORAL_CAPSULE | Freq: Every day | ORAL | Status: DC

## 2020-07-10 NOTE — Assessment & Plan Note (Signed)
Sees endo. Continue levothyroxine replacement.

## 2020-07-10 NOTE — Progress Notes (Signed)
Patient ID: Kelsey Yoder, female    DOB: 22-Mar-1946, 74 y.o.   MRN: [id number]  This visit was conducted in person.  BP 116/60 (BP Location: Left Arm, Patient Position: Sitting, Cuff Size: Normal)   Pulse 82   Temp 97.7 F (36.5 C) (Temporal)   Ht 4' 11.5" (1.511 m)   Wt 146 lb 2 oz (66.3 kg)   SpO2 96%   BMI 29.02 kg/m    CC: CPE Subjective:   HPI: Kelsey Yoder is a 74 y.o. female presenting on 07/10/2020 for Annual Exam   Did not see health advisor this year.  Does not have medicare.   No exam data present  [place] Visit from 07/10/2020 in [place] at [facility] Total Score 0      No flowsheet data found.    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 May be worsening with numbness spreading up forearm.  Intermittent R lateral thigh burning s/p neuro eval - meralgia paresthetica. Manages with gabapentin 600mg  at night PRN.   Hypothyroidism followed by endo. No thyroid symptoms.  Recent episode of palpitation 1 month ago - seen at ER then saw cardiology s/p reassuring evaluation. This happened during a time she ran out of thyroid medication.   Preventative: COLONOSCOPY 08/2015;HP, mild diverticulosis, rpt 5 yrs ([facility]) Well woman - OBGYN4/2018(Dr. Anyanwu). Planned f/u Q2 - overdue yrs.S/p total hysterectomy 2008 for h/o abnormal paps, ovaries removed as well. Mammogram 01/2020, Birads2 @ breast center DEXA -11/2016- osteopenia, T -1.8at hip. Regular with calcium and vit D and weight bearing exercise. Flu shottoday COVID vaccine Pfizer 08/2019, 09/2019  Td 2000, 2010.  Pneumovax 2014. prevnar 05/2014.  Zostavax 2012  Shingrix - discussed, declines  Advanced directive:living will in chart 06/2014 - no prolonged life support if terminal condition. Would wantHCPOAto besister [name]. HCPOA form providedpreviously.  Seat belt use discussed Sunscreen use discussed. 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  Diet: some water, fruits and vegetables daily     Relevant past medical, surgical, family and social history reviewed and updated as indicated. Interim medical history since our last visit reviewed. Allergies and medications reviewed and updated. Outpatient Medications Prior to Visit  Medication Sig Dispense Refill  . Calcium Carb-Cholecalciferol 600-800 MG-UNIT TABS Take 1 tablet by mouth daily.    . Cholecalciferol (VITAMIN D3) 50 MCG (2000 UT) capsule Take 1 capsule by mouth daily.    Marland Kitchen ELDERBERRY PO Take by mouth daily at 12 noon.    . gabapentin (NEURONTIN) 300 MG capsule Takes 2 capsules at bedtime as needed    . levothyroxine (SYNTHROID) 75 MCG tablet Take 1 tablet (75 mcg total) by mouth daily before breakfast. Skip dose once a week 90 tablet 1  . Multiple Vitamin (MULTIVITAMIN) tablet Take 1 tablet by mouth daily.    . Multiple Vitamins-Minerals (ZINC PO) Take by mouth daily at 6 (six) AM.    . OVER THE COUNTER MEDICATION Take 1 capsule by mouth daily. Tumeric     No facility-administered medications prior to visit.     Per HPI unless specifically indicated in ROS section below Review of Systems  Constitutional:  Negative for activity change, appetite change, chills, fatigue, fever and unexpected weight change.  HENT: Negative for hearing loss.   Eyes: Negative for visual disturbance.  Respiratory: Negative for cough, chest tightness, shortness of breath and wheezing.   Cardiovascular: Positive for palpitations (saw cards). Negative for chest pain and leg swelling.  Gastrointestinal: Negative for abdominal distention, abdominal pain, blood in stool, constipation,  diarrhea, nausea and vomiting.  Genitourinary: Negative for difficulty urinating and hematuria.  Musculoskeletal: Negative for arthralgias, myalgias and neck pain.  Skin: Negative for rash.  Neurological: Negative for dizziness, seizures, syncope and headaches.  Hematological: Negative for adenopathy. Does not bruise/bleed easily.  Psychiatric/Behavioral: Negative for dysphoric mood. The patient is not nervous/anxious.    Objective:  BP 116/60 (BP Location: Left Arm, Patient Position: Sitting, Cuff Size: Normal)   Pulse 82   Temp 97.7 F (36.5 C) (Temporal)   Ht 4' 11.5" (1.511 m)   Wt 146 lb 2 oz (66.3 kg)   SpO2 96%   BMI 29.02 kg/m   Wt Readings from Last 3 Encounters:  07/10/20 146 lb 2 oz (66.3 kg)  06/25/20 146 lb 6 oz (66.4 kg)  06/13/20 143 lb (64.9 kg)      Physical Exam Vitals and nursing note reviewed.  Constitutional:      General: She is not in acute distress.    Appearance: Normal appearance. She is well-developed and well-nourished. She is not ill-appearing.  HENT:     Head: Normocephalic and atraumatic.     Right Ear: Hearing, tympanic membrane, ear canal and external ear normal.     Left Ear: Hearing, tympanic membrane, ear canal and external ear normal.     Mouth/Throat:     Mouth: Oropharynx is clear and moist and mucous membranes are normal.     Pharynx: No posterior oropharyngeal edema.  Eyes:     General: No scleral icterus.    Extraocular Movements: Extraocular movements intact and EOM normal.     Conjunctiva/sclera: Conjunctivae normal.     Pupils: Pupils are equal, round, and reactive to light.  Neck:     Thyroid: No thyroid mass or thyromegaly.     Vascular: No carotid bruit.  Cardiovascular:     Rate and Rhythm: Normal rate and regular rhythm.     Pulses: Normal pulses and intact distal pulses.          Radial pulses are 2+ on the right side and 2+ on the left side.     Heart sounds: Normal heart sounds. No murmur heard.   Pulmonary:      Effort: Pulmonary effort is normal. No respiratory distress.     Breath sounds: Normal breath sounds. No wheezing, rhonchi or rales.  Abdominal:     General: Abdomen is flat. Bowel sounds are normal. There is no distension.     Palpations: Abdomen is soft. There is no mass.     Tenderness: There is no abdominal tenderness. There is no guarding or rebound.     Hernia: No hernia is present.  Musculoskeletal:        General: No edema. Normal range of motion.     Cervical back: Normal range of motion and neck supple.     Right lower leg: No edema.     Left lower leg: No edema.  Lymphadenopathy:     Cervical: No cervical adenopathy.  Skin:    General: Skin is warm and dry.     Findings: No rash.  Neurological:  General: No focal deficit present.     Mental Status: She is alert and oriented to person, place, and time.     Comments: CN grossly intact, station and gait intact  Psychiatric:        Mood and Affect: Mood and affect and mood normal.        Behavior: Behavior normal.        Thought Content: Thought content normal.        Judgment: Judgment normal.       Results for orders placed or performed in visit on 06/29/20  VITAMIN D 25 Hydroxy (Vit-D Deficiency, Fractures)  Result Value Ref Range   VITD 47.74 30.00 - 100.00 ng/mL  T4, free  Result Value Ref Range   Free T4 0.88 0.60 - 1.60 ng/dL  TSH  Result Value Ref Range   TSH 4.47 0.35 - 4.50 uIU/mL  Comprehensive metabolic panel  Result Value Ref Range   Sodium 142 135 - 145 mEq/L   Potassium 4.2 3.5 - 5.1 mEq/L   Chloride 107 96 - 112 mEq/L   CO2 30 19 - 32 mEq/L   Glucose, Bld 90 70 - 99 mg/dL   BUN 14 6 - 23 mg/dL   Creatinine, Ser 0.90 0.40 - 1.20 mg/dL   Total Bilirubin 0.5 0.2 - 1.2 mg/dL   Alkaline Phosphatase 115 39 - 117 U/L   AST 18 0 - 37 U/L   ALT 15 0 - 35 U/L   Total Protein 6.7 6.0 - 8.3 g/dL   Albumin 3.9 3.5 - 5.2 g/dL   GFR 63.99 >60.00 mL/min   Calcium 9.4 8.4 - 10.5 mg/dL  Lipid panel   Result Value Ref Range   Cholesterol 218 (H) 0 - 200 mg/dL   Triglycerides 80.0 0.0 - 149.0 mg/dL   HDL 55.30 >39.00 mg/dL   VLDL 16.0 0.0 - 40.0 mg/dL   LDL Cholesterol 147 (H) 0 - 99 mg/dL   Total CHOL/HDL Ratio 4    NonHDL 162.84    Assessment & Plan:  This visit occurred during the SARS-CoV-2 public health emergency.  Safety protocols were in place, including screening questions prior to the visit, additional usage of staff PPE, and extensive cleaning of exam room while observing appropriate contact time as indicated for disinfecting solutions.   Problem List Items Addressed This Visit    Postablative hypothyroidism    Sees endo. Continue levothyroxine replacement.       Osteopenia of neck of left femur    Continue cal, vit D, regular weight bearing exercise      Mild hyperlipidemia    Chronic, off meds.  Brother and sister with h/o strokes.  Discussed statin - declines at this time. Will renew efforts at low chol diet.  The 10-year ASCVD risk score Mikey Bussing DC Brooke Bonito., et al., 2013) is: 12.4%   Values used to calculate the score:     Age: 48 years     Sex: Female     Is Non-Hispanic African American: Yes     Diabetic: No     Tobacco smoker: No     Systolic Blood Pressure: 99991111 mmHg     Is BP treated: No     HDL Cholesterol: 55.3 mg/dL     Total Cholesterol: 218 mg/dL       Meralgia paresthetica of right side   Relevant Medications   gabapentin (NEURONTIN) 300 MG capsule   Health maintenance examination - Primary    Preventative protocols reviewed and  updated unless pt declined. Discussed healthy diet and lifestyle.        Other Visit Diagnoses    Need for influenza vaccination       Relevant Orders   Flu Vaccine QUAD High Dose(Fluad) (Completed)       Meds ordered this encounter  Medications  . Turmeric 500 MG CAPS    Sig: Take 1 capsule by mouth daily.   Orders Placed This Encounter  Procedures  . Flu Vaccine QUAD High Dose(Fluad)    Patient  instructions: Flu shot today  Continue working on low cholesterol diet to improve LDL readings - increase fiber and beans/lentils/legumes.  Call to schedule follow up with hand surgery clinic.  Call to schedule well woman exam with Dr Mont Dutton office. Consider shingrix vaccine (updated shingles shot).  Consider updating tetanus vaccine.  Return as needed or in 1 year for next physical  Follow up plan: Return in about 1 year (around 07/10/2021) for annual exam, prior fasting for blood work.  Eustaquio Boyden, MD

## 2020-07-10 NOTE — Assessment & Plan Note (Signed)
Preventative protocols reviewed and updated unless pt declined. Discussed healthy diet and lifestyle.  

## 2020-07-10 NOTE — Assessment & Plan Note (Signed)
Continue cal, vit D, regular weight bearing exercise

## 2020-07-10 NOTE — Assessment & Plan Note (Addendum)
Chronic, off meds.  Brother and sister with h/o strokes.  Discussed statin - declines at this time. Will renew efforts at low chol diet.  The 10-year ASCVD risk score Denman George DC Montez Hageman., et al., 2013) is: 12.4%   Values used to calculate the score:     Age: 74 years     Sex: Female     Is Non-Hispanic African American: Yes     Diabetic: No     Tobacco smoker: No     Systolic Blood Pressure: 116 mmHg     Is BP treated: No     HDL Cholesterol: 55.3 mg/dL     Total Cholesterol: 218 mg/dL

## 2020-07-10 NOTE — Patient Instructions (Addendum)
Flu shot today  Continue working on low cholesterol diet to improve LDL readings - increase fiber and beans/lentils/legumes.  Call to schedule follow up with hand surgery clinic.  Call to schedule well woman exam with Dr Mont Dutton office. Consider shingrix vaccine (updated shingles shot).  Consider updating tetanus vaccine.  Return as needed or in 1 year for next physical  Health Maintenance After Age 72 After age 56, you are at a higher risk for certain long-term diseases and infections as well as injuries from falls. Falls are a major cause of broken bones and head injuries in people who are older than age 6. Getting regular preventive care can help to keep you healthy and well. Preventive care includes getting regular testing and making lifestyle changes as recommended by your health care provider. Talk with your health care provider about:  Which screenings and tests you should have. A screening is a test that checks for a disease when you have no symptoms.  A diet and exercise plan that is right for you. What should I know about screenings and tests to prevent falls? Screening and testing are the best ways to find a health problem early. Early diagnosis and treatment give you the best chance of managing medical conditions that are common after age 13. Certain conditions and lifestyle choices may make you more likely to have a fall. Your health care provider may recommend:  Regular vision checks. Poor vision and conditions such as cataracts can make you more likely to have a fall. If you wear glasses, make sure to get your prescription updated if your vision changes.  Medicine review. Work with your health care provider to regularly review all of the medicines you are taking, including over-the-counter medicines. Ask your health care provider about any side effects that may make you more likely to have a fall. Tell your health care provider if any medicines that you take make you feel dizzy  or sleepy.  Osteoporosis screening. Osteoporosis is a condition that causes the bones to get weaker. This can make the bones weak and cause them to break more easily.  Blood pressure screening. Blood pressure changes and medicines to control blood pressure can make you feel dizzy.  Strength and balance checks. Your health care provider may recommend certain tests to check your strength and balance while standing, walking, or changing positions.  Foot health exam. Foot pain and numbness, as well as not wearing proper footwear, can make you more likely to have a fall.  Depression screening. You may be more likely to have a fall if you have a fear of falling, feel emotionally low, or feel unable to do activities that you used to do.  Alcohol use screening. Using too much alcohol can affect your balance and may make you more likely to have a fall. What actions can I take to lower my risk of falls? General instructions  Talk with your health care provider about your risks for falling. Tell your health care provider if: ? You fall. Be sure to tell your health care provider about all falls, even ones that seem minor. ? You feel dizzy, sleepy, or off-balance.  Take over-the-counter and prescription medicines only as told by your health care provider. These include any supplements.  Eat a healthy diet and maintain a healthy weight. A healthy diet includes low-fat dairy products, low-fat (lean) meats, and fiber from whole grains, beans, and lots of fruits and vegetables. Home safety  Remove any tripping hazards, such as  rugs, cords, and clutter.  Install safety equipment such as grab bars in bathrooms and safety rails on stairs.  Keep rooms and walkways well-lit. Activity   Follow a regular exercise program to stay fit. This will help you maintain your balance. Ask your health care provider what types of exercise are appropriate for you.  If you need a cane or walker, use it as recommended  by your health care provider.  Wear supportive shoes that have nonskid soles. Lifestyle  Do not drink alcohol if your health care provider tells you not to drink.  If you drink alcohol, limit how much you have: ? 0-1 drink a day for women. ? 0-2 drinks a day for men.  Be aware of how much alcohol is in your drink. In the [place] one drink equals one typical bottle of beer (12 oz), one-half glass of wine (5 oz), or one shot of hard liquor (1 oz).  Do not use any products that contain nicotine or tobacco, such as cigarettes and e-cigarettes. If you need help quitting, ask your health care provider. Summary  Having a healthy lifestyle and getting preventive care can help to protect your health and wellness after age 44.  Screening and testing are the best way to find a health problem early and help you avoid having a fall. Early diagnosis and treatment give you the best chance for managing medical conditions that are more common for people who are older than age 87.  Falls are a major cause of broken bones and head injuries in people who are older than age 67. Take precautions to prevent a fall at home.  Work with your health care provider to learn what changes you can make to improve your health and wellness and to prevent falls. This information is not intended to replace advice given to you by your health care provider. Make sure you discuss any questions you have with your health care provider. Document Revised: 10/21/2018 Document Reviewed: 05/13/2017 Elsevier Patient Education  2020 [employer].

## 2020-08-27 ENCOUNTER — Encounter: Payer: Self-pay | Admitting: Obstetrics & Gynecology

## 2020-08-27 ENCOUNTER — Other Ambulatory Visit: Payer: Self-pay

## 2020-08-27 ENCOUNTER — Ambulatory Visit (INDEPENDENT_AMBULATORY_CARE_PROVIDER_SITE_OTHER): Payer: Federal, State, Local not specified - PPO | Admitting: Obstetrics & Gynecology

## 2020-08-27 VITALS — BP 114/75 | HR 69 | Ht 59.5 in | Wt 146.2 lb

## 2020-08-27 DIAGNOSIS — Z01419 Encounter for gynecological examination (general) (routine) without abnormal findings: Secondary | ICD-10-CM | POA: Diagnosis not present

## 2020-08-27 NOTE — Progress Notes (Signed)
GYNECOLOGY ANNUAL PREVENTATIVE CARE ENCOUNTER NOTE  History:     Kelsey Yoder is a 75 y.o. G37 PMP female s/p TAH/BSO for benign indications in 2008 here for a routine annual gynecologic exam.  Current complaints: none.  Denies abnormal vaginal bleeding, discharge, vasomotor symptoms, pelvic pain, problems with intercourse or other gynecologic concerns.      Gynecologic History No LMP recorded. Patient has had a hysterectomy. Normal vaginal cuff pap smears from 2008-2013; no further paps needed Last mammogram: 02/02/2020. Results were: normal  Obstetric History OB History  Gravida Para Term Preterm AB Living  0 0 0 0 0 0  SAB IAB Ectopic Multiple Live Births  0 0 0 0      Past Medical History:  Diagnosis Date  . Aortic valve sclerosis    last echo 04/2009  . 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 Cataract   . Hyperthyroidism 01/07/2016   Dr Dwyane Dee, positive TSHR Ab  . Osteopenia   . PONV (postoperative nausea and vomiting)    post-op nausea  . Sinus drainage     Past Surgical History:  Procedure Laterality Date  . BREAST BIOPSY Right 2010   benign   . BREAST EXCISIONAL BIOPSY Right 2010   benign  . CARPAL TUNNEL RELEASE Right 04/25/2013   CARPAL TUNNEL RELEASE;  Surgeon: Tennis Must, MD;  [place]  . CATARACT EXTRACTION Bilateral 2013  . COLONOSCOPY  2002   rectosigmoid bulge thought 2/2 fibroids  . COLONOSCOPY  2005   polyps, diverticulosis, rpt 7-10 yrs ([facility])  . COLONOSCOPY  08/2015   HP, mild diverticulosis, rpt 5 yrs  ([facility])  . DENTAL SURGERY     dental implants  . ESOPHAGOGASTRODUODENOSCOPY  2005   antral gastropathy, neg H pylori screen ([facility])  . TONSILLECTOMY  1976  . TOTAL ABDOMINAL HYSTERECTOMY W/ BILATERAL SALPINGOOPHORECTOMY  2008   h/o abnormal pap smears    Current Outpatient Medications on File Prior to Visit  Medication Sig Dispense Refill  . Calcium Carb-Cholecalciferol 600-800 MG-UNIT  TABS Take 1 tablet by mouth daily.    . Cholecalciferol (VITAMIN D3) 50 MCG (2000 UT) capsule Take 1 capsule by mouth daily.    Marland Kitchen ELDERBERRY PO Take by mouth daily at 12 noon.    Marland Kitchen levothyroxine (SYNTHROID) 75 MCG tablet Take 1 tablet (75 mcg total) by mouth daily before breakfast. Skip dose once a week 90 tablet 1  . Multiple Vitamin (MULTIVITAMIN) tablet Take 1 tablet by mouth daily.    . Turmeric 500 MG CAPS Take 1 capsule by mouth daily.    Marland Kitchen gabapentin (NEURONTIN) 300 MG capsule Takes 2 capsules at bedtime as needed (Patient not taking: Reported on 08/27/2020)    . Multiple Vitamins-Minerals (ZINC PO) Take by mouth daily at 6 (six) AM. (Patient not taking: Reported on 08/27/2020)     No current facility-administered medications on file prior to visit.    No Known Allergies  Social History:  reports that she has never smoked. She has never used smokeless tobacco. She reports that she does not drink alcohol and does not use drugs.  Family History  Problem Relation Age of Onset  . Diabetes Mother        double amputee  . Congestive Heart Failure Mother   . Diabetes Father   . Stroke Sister   . Lupus Sister        X 2  . Diabetes Brother   .  Cancer Brother        duodenal, prostate   . Stroke Maternal Grandmother   . Stroke Maternal Grandfather   . Breast cancer Sister 25  . Colon cancer Neg Hx   . Colon polyps Neg Hx   . Thyroid disease Neg Hx     The following portions of the patient's history were reviewed and updated as appropriate: allergies, current medications, past family history, past medical history, past social history, past surgical history and problem list.  Review of Systems Pertinent items noted in HPI and remainder of comprehensive ROS otherwise negative.  Physical Exam:  BP 114/75   Pulse 69   Ht 4' 11.5" (1.511 m)   Wt 146 lb 3.2 oz (66.3 kg)   BMI 29.03 kg/m  CONSTITUTIONAL: Well-developed, well-nourished female in no acute distress.  HENT:   Normocephalic, atraumatic, External right and left ear normal.  EYES: Conjunctivae and EOM are normal. Pupils are equal, round, and reactive to light. No scleral icterus.  NECK: Normal range of motion, supple, no masses.  Normal thyroid.  SKIN: Skin is warm and dry. No rash noted. Not diaphoretic. No erythema. No pallor. MUSCULOSKELETAL: Normal range of motion. No tenderness.  No cyanosis, clubbing, or edema. NEUROLOGIC: Alert and oriented to person, place, and time. Normal reflexes, muscle tone coordination.  PSYCHIATRIC: Normal mood and affect. Normal behavior. Normal judgment and thought content. CARDIOVASCULAR: Normal heart rate noted, regular rhythm RESPIRATORY: Clear to auscultation bilaterally. Effort and breath sounds normal, no problems with respiration noted. BREASTS: Symmetric in size. No masses, tenderness, skin changes, nipple drainage, or lymphadenopathy bilaterally. Performed in the presence of a chaperone. ABDOMEN: Soft, no distention noted.  No tenderness, rebound or guarding.  PELVIC: Normal appearing external genitalia and urethral meatus; vagina is with significant atrophy, well-healed vaginal cuff.  No abnormal discharge noted, no tenderness.  Performed in the presence of a chaperone.   Assessment and Plan:    1. Well woman exam with routine gynecological exam Normal postmenopausal gynecologic examination. No pap smears indicated given absence of cervix, hysterectomy done for benign reasons.  Can come for follow up as needed. Routine preventative health maintenance measures emphasized. Please refer to After Visit Summary for other counseling recommendations.      Verita Schneiders, MD, [place] for [employer], [place]

## 2020-08-27 NOTE — Patient Instructions (Signed)
Preventive Care 61 Years and Older, Female Preventive care refers to lifestyle choices and visits with your health care provider that can promote health and wellness. This includes:  A yearly physical exam. This is also called an annual wellness visit.  Regular dental and eye exams.  Immunizations.  Screening for certain conditions.  Healthy lifestyle choices, such as: ? Eating a healthy diet. ? Getting regular exercise. ? Not using drugs or products that contain nicotine and tobacco. ? Limiting alcohol use. What can I expect for my preventive care visit? Physical exam Your health care provider will check your:  Height and weight. These may be used to calculate your BMI (body mass index). BMI is a measurement that tells if you are at a healthy weight.  Heart rate and blood pressure.  Body temperature.  Skin for abnormal spots. Counseling Your health care provider may ask you questions about your:  Past medical problems.  Family's medical history.  Alcohol, tobacco, and drug use.  Emotional well-being.  Home life and relationship well-being.  Sexual activity.  Diet, exercise, and sleep habits.  History of falls.  Memory and ability to understand (cognition).  Work and work Statistician.  Pregnancy and menstrual history.  Access to firearms. What immunizations do I need? Vaccines are usually given at various ages, according to a schedule. Your health care provider will recommend vaccines for you based on your age, medical history, and lifestyle or other factors, such as travel or where you work.   What tests do I need? Blood tests  Lipid and cholesterol levels. These may be checked every 5 years, or more often depending on your overall health.  Hepatitis C test.  Hepatitis B test. Screening  Lung cancer screening. You may have this screening every year starting at age 73 if you have a 30-pack-year history of smoking and currently smoke or have quit within  the past 15 years.  Colorectal cancer screening. ? All adults should have this screening starting at age 73 and continuing until age 73. ? Your health care provider may recommend screening at age 73 if you are at increased risk. ? You will have tests every 1-10 years, depending on your results and the type of screening test.  Diabetes screening. ? This is done by checking your blood sugar (glucose) after you have not eaten for a while (fasting). ? You may have this done every 1-3 years.  Mammogram. ? This may be done every 1-2 years. ? Talk with your health care provider about how often you should have regular mammograms.  Abdominal aortic aneurysm (AAA) screening. You may need this if you are a current or former smoker.  BRCA-related cancer screening. This may be done if you have a family history of breast, ovarian, tubal, or peritoneal cancers. Other tests  STD (sexually transmitted disease) testing, if you are at risk.  Bone density scan. This is done to screen for osteoporosis. You may have this done starting at age 73. Talk with your health care provider about your test results, treatment options, and if necessary, the need for more tests. Follow these instructions at home: Eating and drinking  Eat a diet that includes fresh fruits and vegetables, whole grains, lean protein, and low-fat dairy products. Limit your intake of foods with high amounts of sugar, saturated fats, and salt.  Take vitamin and mineral supplements as recommended by your health care provider.  Do not drink alcohol if your health care provider tells you not to drink.  If you drink alcohol: ? Limit how much you have to 0-1 drink a day. ? Be aware of how much alcohol is in your drink. In the [place] one drink equals one 12 oz bottle of beer (355 mL), one 5 oz glass of wine (148 mL), or one 1 oz glass of hard liquor (44 mL).   Lifestyle  Take daily care of your teeth and gums. Brush your teeth every morning  and night with fluoride toothpaste. Floss one time each day.  Stay active. Exercise for at least 30 minutes 5 or more days each week.  Do not use any products that contain nicotine or tobacco, such as cigarettes, e-cigarettes, and chewing tobacco. If you need help quitting, ask your health care provider.  Do not use drugs.  If you are sexually active, practice safe sex. Use a condom or other form of protection in order to prevent STIs (sexually transmitted infections).  Talk with your health care provider about taking a low-dose aspirin or statin.  Find healthy ways to cope with stress, such as: ? Meditation, yoga, or listening to music. ? Journaling. ? Talking to a trusted person. ? Spending time with friends and family. Safety  Always wear your seat belt while driving or riding in a vehicle.  Do not drive: ? If you have been drinking alcohol. Do not ride with someone who has been drinking. ? When you are tired or distracted. ? While texting.  Wear a helmet and other protective equipment during sports activities.  If you have firearms in your house, make sure you follow all gun safety procedures. What's next?  Visit your health care provider once a year for an annual wellness visit.  Ask your health care provider how often you should have your eyes and teeth checked.  Stay up to date on all vaccines. This information is not intended to replace advice given to you by your health care provider. Make sure you discuss any questions you have with your health care provider. Document Revised: 06/20/2020 Document Reviewed: 06/24/2018 Elsevier Patient Education  2021 Elsevier Inc.  

## 2020-09-06 ENCOUNTER — Other Ambulatory Visit: Payer: Self-pay | Admitting: Orthopedic Surgery

## 2020-09-17 ENCOUNTER — Encounter (HOSPITAL_BASED_OUTPATIENT_CLINIC_OR_DEPARTMENT_OTHER): Payer: Self-pay | Admitting: Orthopedic Surgery

## 2020-09-17 ENCOUNTER — Other Ambulatory Visit: Payer: Self-pay

## 2020-09-21 ENCOUNTER — Other Ambulatory Visit (HOSPITAL_COMMUNITY)
Admission: RE | Admit: 2020-09-21 | Discharge: 2020-09-21 | Disposition: A | Discharge status: Home / Self Care | Payer: Federal, State, Local not specified - PPO | Source: Ambulatory Visit | Attending: Orthopedic Surgery | Admitting: Orthopedic Surgery

## 2020-09-21 DIAGNOSIS — Z20822 Contact with and (suspected) exposure to covid-19: Secondary | ICD-10-CM | POA: Diagnosis not present

## 2020-09-21 DIAGNOSIS — Z79890 Hormone replacement therapy (postmenopausal): Secondary | ICD-10-CM | POA: Diagnosis not present

## 2020-09-21 DIAGNOSIS — Z01812 Encounter for preprocedural laboratory examination: Secondary | ICD-10-CM | POA: Insufficient documentation

## 2020-09-21 DIAGNOSIS — G5602 Carpal tunnel syndrome, left upper limb: Secondary | ICD-10-CM | POA: Diagnosis present

## 2020-09-21 NOTE — Progress Notes (Signed)
Reminder text sent to pt to go for covid testing. Address of testing site and hours for testing provided.

## 2020-09-21 NOTE — Pre-Procedure Instructions (Signed)

## 2020-09-22 LAB — SARS CORONAVIRUS 2 (TAT 6-24 HRS): SARS Coronavirus 2: NEGATIVE

## 2020-09-24 ENCOUNTER — Ambulatory Visit (HOSPITAL_BASED_OUTPATIENT_CLINIC_OR_DEPARTMENT_OTHER): Payer: Federal, State, Local not specified - PPO | Admitting: Anesthesiology

## 2020-09-24 ENCOUNTER — Ambulatory Visit (HOSPITAL_BASED_OUTPATIENT_CLINIC_OR_DEPARTMENT_OTHER)
Admission: RE | Admit: 2020-09-24 | Discharge: 2020-09-24 | Disposition: A | Discharge status: Home / Self Care | Payer: Federal, State, Local not specified - PPO | Attending: Orthopedic Surgery | Admitting: Orthopedic Surgery

## 2020-09-24 ENCOUNTER — Other Ambulatory Visit: Payer: Self-pay

## 2020-09-24 ENCOUNTER — Encounter (HOSPITAL_BASED_OUTPATIENT_CLINIC_OR_DEPARTMENT_OTHER): Payer: Self-pay | Admitting: Orthopedic Surgery

## 2020-09-24 ENCOUNTER — Encounter (HOSPITAL_BASED_OUTPATIENT_CLINIC_OR_DEPARTMENT_OTHER)
Admission: RE | Disposition: A | Discharge status: Home / Self Care | Payer: Self-pay | Source: Home / Self Care | Attending: Orthopedic Surgery

## 2020-09-24 DIAGNOSIS — Z79890 Hormone replacement therapy (postmenopausal): Secondary | ICD-10-CM | POA: Insufficient documentation

## 2020-09-24 DIAGNOSIS — Z20822 Contact with and (suspected) exposure to covid-19: Secondary | ICD-10-CM | POA: Insufficient documentation

## 2020-09-24 DIAGNOSIS — G5602 Carpal tunnel syndrome, left upper limb: Secondary | ICD-10-CM | POA: Insufficient documentation

## 2020-09-24 HISTORY — PX: CARPAL TUNNEL RELEASE: SHX101

## 2020-09-24 SURGERY — CARPAL TUNNEL RELEASE
Anesthesia: General | Site: Wrist | Laterality: Left

## 2020-09-24 MED ORDER — ONDANSETRON HCL 4 MG/2ML IJ SOLN
INTRAMUSCULAR | Status: DC | PRN
  Administered 2020-09-24: 4 mg via INTRAVENOUS

## 2020-09-24 MED ORDER — FENTANYL CITRATE (PF) 100 MCG/2ML IJ SOLN: 25.0000 ug | INTRAMUSCULAR | Status: DC | PRN

## 2020-09-24 MED ORDER — 0.9 % SODIUM CHLORIDE (POUR BTL) OPTIME
TOPICAL | Status: DC | PRN
  Administered 2020-09-24: 200 mL

## 2020-09-24 MED ORDER — ONDANSETRON HCL 4 MG/2ML IJ SOLN: 4.0000 mg | Freq: Once | INTRAMUSCULAR | Status: DC | PRN

## 2020-09-24 MED ORDER — OXYCODONE HCL 5 MG/5ML PO SOLN: 5.0000 mg | Freq: Once | ORAL | Status: DC | PRN

## 2020-09-24 MED ORDER — PROPOFOL 10 MG/ML IV BOLUS
INTRAVENOUS | Status: DC | PRN
  Administered 2020-09-24: 130 mg via INTRAVENOUS

## 2020-09-24 MED ORDER — BUPIVACAINE HCL (PF) 0.25 % IJ SOLN
INTRAMUSCULAR | Status: DC | PRN
  Administered 2020-09-24: 9 mL

## 2020-09-24 MED ORDER — CEFAZOLIN SODIUM-DEXTROSE 2-4 GM/100ML-% IV SOLN
2.0000 g | INTRAVENOUS | Status: AC
  Administered 2020-09-24: 2 g via INTRAVENOUS

## 2020-09-24 MED ORDER — FENTANYL CITRATE (PF) 100 MCG/2ML IJ SOLN
INTRAMUSCULAR | Status: AC
  Filled 2020-09-24: qty 2

## 2020-09-24 MED ORDER — FENTANYL CITRATE (PF) 100 MCG/2ML IJ SOLN
INTRAMUSCULAR | Status: DC | PRN
  Administered 2020-09-24 (×2): 50 ug via INTRAVENOUS

## 2020-09-24 MED ORDER — LIDOCAINE 2% (20 MG/ML) 5 ML SYRINGE
INTRAMUSCULAR | Status: DC | PRN
  Administered 2020-09-24: 60 mg via INTRAVENOUS

## 2020-09-24 MED ORDER — DEXAMETHASONE SODIUM PHOSPHATE 10 MG/ML IJ SOLN
INTRAMUSCULAR | Status: DC | PRN
  Administered 2020-09-24: 5 mg via INTRAVENOUS

## 2020-09-24 MED ORDER — ACETAMINOPHEN 325 MG PO TABS: 325.0000 mg | ORAL_TABLET | ORAL | Status: DC | PRN

## 2020-09-24 MED ORDER — CEFAZOLIN SODIUM-DEXTROSE 2-4 GM/100ML-% IV SOLN
INTRAVENOUS | Status: AC
  Filled 2020-09-24: qty 100

## 2020-09-24 MED ORDER — MEPERIDINE HCL 25 MG/ML IJ SOLN: 6.2500 mg | INTRAMUSCULAR | Status: DC | PRN

## 2020-09-24 MED ORDER — LACTATED RINGERS IV SOLN: INTRAVENOUS | Status: DC

## 2020-09-24 MED ORDER — PHENYLEPHRINE 40 MCG/ML (10ML) SYRINGE FOR IV PUSH (FOR BLOOD PRESSURE SUPPORT)
PREFILLED_SYRINGE | INTRAVENOUS | Status: DC | PRN
  Administered 2020-09-24: 80 ug via INTRAVENOUS

## 2020-09-24 MED ORDER — HYDROCODONE-ACETAMINOPHEN 5-325 MG PO TABS: ORAL_TABLET | ORAL | 0 refills | Status: DC

## 2020-09-24 MED ORDER — ACETAMINOPHEN 160 MG/5ML PO SOLN
325.0000 mg | ORAL | Status: DC | PRN
Start: 2020-09-24 — End: 2020-09-24

## 2020-09-24 MED ORDER — OXYCODONE HCL 5 MG PO TABS: 5.0000 mg | ORAL_TABLET | Freq: Once | ORAL | Status: DC | PRN

## 2020-09-24 SURGICAL SUPPLY — 35 items
APL PRP STRL LF DISP 70% ISPRP (MISCELLANEOUS) ×1
BLADE SURG 15 STRL LF DISP TIS (BLADE) ×2 IMPLANT
BLADE SURG 15 STRL SS (BLADE) ×4
BNDG CMPR 9X4 STRL LF SNTH (GAUZE/BANDAGES/DRESSINGS) ×1
BNDG ELASTIC 3X5.8 VLCR STR LF (GAUZE/BANDAGES/DRESSINGS) ×2 IMPLANT
BNDG ESMARK 4X9 LF (GAUZE/BANDAGES/DRESSINGS) ×2 IMPLANT
BNDG GAUZE ELAST 4 BULKY (GAUZE/BANDAGES/DRESSINGS) ×2 IMPLANT
CHLORAPREP W/TINT 26 (MISCELLANEOUS) ×2 IMPLANT
CORD BIPOLAR FORCEPS 12FT (ELECTRODE) ×2 IMPLANT
COVER BACK TABLE 60X90IN (DRAPES) ×2 IMPLANT
COVER MAYO STAND STRL (DRAPES) ×2 IMPLANT
COVER WAND RF STERILE (DRAPES) IMPLANT
CUFF TOURN SGL QUICK 18X4 (TOURNIQUET CUFF) ×2 IMPLANT
DRAPE EXTREMITY T 121X128X90 (DISPOSABLE) ×2 IMPLANT
DRAPE SURG 17X23 STRL (DRAPES) ×2 IMPLANT
DRSG PAD ABDOMINAL 8X10 ST (GAUZE/BANDAGES/DRESSINGS) ×2 IMPLANT
GAUZE SPONGE 4X4 12PLY STRL (GAUZE/BANDAGES/DRESSINGS) ×2 IMPLANT
GAUZE XEROFORM 1X8 LF (GAUZE/BANDAGES/DRESSINGS) ×2 IMPLANT
GLOVE SRG 8 PF TXTR STRL LF DI (GLOVE) ×1 IMPLANT
GLOVE SURG ENC MOIS LTX SZ7.5 (GLOVE) ×2 IMPLANT
GLOVE SURG UNDER POLY LF SZ8 (GLOVE) ×2
GOWN STRL REUS W/ TWL LRG LVL3 (GOWN DISPOSABLE) ×1 IMPLANT
GOWN STRL REUS W/TWL LRG LVL3 (GOWN DISPOSABLE) ×2
GOWN STRL REUS W/TWL XL LVL3 (GOWN DISPOSABLE) ×2 IMPLANT
NEEDLE HYPO 25X1 1.5 SAFETY (NEEDLE) ×2 IMPLANT
NS IRRIG 1000ML POUR BTL (IV SOLUTION) ×2 IMPLANT
PACK BASIN DAY SURGERY FS (CUSTOM PROCEDURE TRAY) ×2 IMPLANT
PADDING CAST ABS 4INX4YD NS (CAST SUPPLIES) ×1
PADDING CAST ABS COTTON 4X4 ST (CAST SUPPLIES) ×1 IMPLANT
STOCKINETTE 4X48 STRL (DRAPES) ×2 IMPLANT
SUT ETHILON 4 0 PS 2 18 (SUTURE) ×2 IMPLANT
SYR BULB EAR ULCER 3OZ GRN STR (SYRINGE) ×2 IMPLANT
SYR CONTROL 10ML LL (SYRINGE) ×2 IMPLANT
TOWEL GREEN STERILE FF (TOWEL DISPOSABLE) ×4 IMPLANT
UNDERPAD 30X36 HEAVY ABSORB (UNDERPADS AND DIAPERS) ×2 IMPLANT

## 2020-09-24 NOTE — Anesthesia Procedure Notes (Signed)
Procedure Name: LMA Insertion Date/Time: 09/24/2020 2:02 PM Performed by: Eulas Post, Bhavesh Vazquez W, CRNA Pre-anesthesia Checklist: Patient identified, Emergency Drugs available, Suction available and Patient being monitored Patient Re-evaluated:Patient Re-evaluated prior to induction Oxygen Delivery Method: Circle system utilized Preoxygenation: Pre-oxygenation with 100% oxygen Induction Type: IV induction Ventilation: Mask ventilation without difficulty LMA: LMA inserted LMA Size: 4.0 Number of attempts: 1 Placement Confirmation: positive ETCO2 and breath sounds checked- equal and bilateral Tube secured with: Tape Dental Injury: Teeth and Oropharynx as per pre-operative assessment

## 2020-09-24 NOTE — Anesthesia Preprocedure Evaluation (Signed)
Anesthesia Evaluation  Patient identified by MRN, date of birth, ID band Patient awake    Reviewed: Allergy & Precautions, H&P , NPO status , Patient's Chart, lab work & pertinent test results  History of Anesthesia Complications (+) PONV  Airway Mallampati: I  TM Distance: >3 FB Neck ROM: Full    Dental   Pulmonary    breath sounds clear to auscultation       Cardiovascular + Valvular Problems/Murmurs  Rhythm:Regular Rate:Normal  Aortic sclerosis   Neuro/Psych    GI/Hepatic   Endo/Other  Hypothyroidism   Renal/GU      Musculoskeletal   Abdominal   Peds  Hematology   Anesthesia Other Findings   Reproductive/Obstetrics                             Anesthesia Physical  Anesthesia Plan  ASA: II  Anesthesia Plan: General   Post-op Pain Management:    Induction: Intravenous  PONV Risk Score and Plan: 4 or greater and Ondansetron, Dexamethasone and Propofol infusion  Airway Management Planned: LMA  Additional Equipment: None  Intra-op Plan:   Post-operative Plan: Extubation in OR  Informed Consent: I have reviewed the patients History and Physical, chart, labs and discussed the procedure including the risks, benefits and alternatives for the proposed anesthesia with the patient or authorized representative who has indicated his/her understanding and acceptance.       Plan Discussed with: CRNA, Surgeon and Anesthesiologist  Anesthesia Plan Comments:         Anesthesia Quick Evaluation

## 2020-09-24 NOTE — H&P (Signed)
Kelsey Yoder is an 75 y.o. female.   Chief Complaint: carpal tunnel syndrome HPI: 75 yo female with numbness and tingling left hand.  Positive nerve conduction studies.  She wishes to have left carpal tunnel release.  Allergies: No Known Allergies  Past Medical History:  Diagnosis Date  . Aortic valve sclerosis    last echo 04/2009  . 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 Cataract   . Hyperthyroidism 01/07/2016   Dr Dwyane Dee, positive TSHR Ab  . Osteopenia   . PONV (postoperative nausea and vomiting)    post-op nausea  . Sinus drainage     Past Surgical History:  Procedure Laterality Date  . BREAST BIOPSY Right 2010   benign   . BREAST EXCISIONAL BIOPSY Right 2010   benign  . CARPAL TUNNEL RELEASE Right 04/25/2013   CARPAL TUNNEL RELEASE;  Surgeon: Tennis Must, MD;  Location: [facility]  . CATARACT EXTRACTION Bilateral 2013  . COLONOSCOPY  2002   rectosigmoid bulge thought 2/2 fibroids  . COLONOSCOPY  2005   polyps, diverticulosis, rpt 7-10 yrs ([facility])  . COLONOSCOPY  08/2015   HP, mild diverticulosis, rpt 5 yrs  ([facility])  . DENTAL SURGERY     dental implants  . ESOPHAGOGASTRODUODENOSCOPY  2005   antral gastropathy, neg H pylori screen ([facility])  . TONSILLECTOMY  1976  . TOTAL ABDOMINAL HYSTERECTOMY W/ BILATERAL SALPINGOOPHORECTOMY  2008   h/o abnormal pap smears    Family History: Family History  Problem Relation Age of Onset  . Diabetes Mother        double amputee  . Congestive Heart Failure Mother   . Diabetes Father   . Stroke Sister   . Lupus Sister        X 2  . Diabetes Brother   . Cancer Brother        duodenal, prostate   . Stroke Maternal Grandmother   . Stroke Maternal Grandfather   . Breast cancer Sister 12  . Colon cancer Neg Hx   . Colon polyps Neg Hx   . Thyroid disease Neg Hx     Social History:   reports that she has never smoked. She has never used smokeless tobacco. She reports that she does not  drink alcohol and does not use drugs.  Medications: Medications Prior to Admission  Medication Sig Dispense Refill  . Calcium Carb-Cholecalciferol 600-800 MG-UNIT TABS Take 1 tablet by mouth daily.    . Cholecalciferol (VITAMIN D3) 50 MCG (2000 UT) capsule Take 1 capsule by mouth daily.    Marland Kitchen ELDERBERRY PO Take by mouth daily at 12 noon.    Marland Kitchen levothyroxine (SYNTHROID) 75 MCG tablet Take 1 tablet (75 mcg total) by mouth daily before breakfast. Skip dose once a week 90 tablet 1  . Multiple Vitamin (MULTIVITAMIN) tablet Take 1 tablet by mouth daily.    . Turmeric 500 MG CAPS Take by mouth.      No results found for this or any previous visit (from the past 48 hour(s)).  No results found.   A comprehensive review of systems was negative.  Blood pressure 133/66, pulse 72, temperature 98.5 F (36.9 C), temperature source Oral, resp. rate 16, height 4' 11.5" (1.511 m), weight 66.7 kg, SpO2 100 %.  General appearance: alert, cooperative and appears stated age Head: Normocephalic, without obvious abnormality, atraumatic Neck: supple, symmetrical, trachea midline Cardio: regular rate and rhythm Resp: clear to auscultation bilaterally Extremities: Intact sensation and capillary  refill all digits.  +epl/fpl/io.  No wounds.  Pulses: 2+ and symmetric Skin: Skin color, texture, turgor normal. No rashes or lesions Neurologic: Grossly normal Incision/Wound: none  Assessment/Plan Left carpal tunnel syndrome.  Non operative and operative treatment options have been discussed with the patient and patient wishes to proceed with operative treatment. Risks, benefits, and alternatives of surgery have been discussed and the patient agrees with the plan of care.   Leanora Cover 09/24/2020, 12:57 PM

## 2020-09-24 NOTE — Transfer of Care (Signed)
Immediate Anesthesia Transfer of Care Note  Patient: ROSSIE SCARFONE  Procedure(s) Performed: LEFT CARPAL TUNNEL RELEASE (Left Wrist)  Patient Location: PACU  Anesthesia Type:General  Level of Consciousness: awake, alert  and oriented  Airway & Oxygen Therapy: Patient Spontanous Breathing and Patient connected to face mask oxygen  Post-op Assessment: Report given to RN and Post -op Vital signs reviewed and stable  Post vital signs: Reviewed and stable  Last Vitals:  Vitals Value Taken Time  BP    Temp    Pulse 68 09/24/20 1436  Resp    SpO2 100 % 09/24/20 1436  Vitals shown include unvalidated device data.  Last Pain:  Vitals:   09/24/20 1230  TempSrc: Oral  PainSc: 0-No pain      Patients Stated Pain Goal: 7 (18/28/83 3744)  Complications: No complications documented.

## 2020-09-24 NOTE — Anesthesia Postprocedure Evaluation (Signed)
Anesthesia Post Note  Patient: ANACLARA ACKLIN  Procedure(s) Performed: LEFT CARPAL TUNNEL RELEASE (Left Wrist)     Patient location during evaluation: PACU Anesthesia Type: General Level of consciousness: awake and alert Pain management: pain level controlled Vital Signs Assessment: post-procedure vital signs reviewed and stable Respiratory status: spontaneous breathing, nonlabored ventilation, respiratory function stable and patient connected to nasal cannula oxygen Cardiovascular status: blood pressure returned to baseline and stable Postop Assessment: no apparent nausea or vomiting Anesthetic complications: no   No complications documented.  Last Vitals:  Vitals:   09/24/20 1445 09/24/20 1500  BP: 128/70 (!) 144/79  Pulse: 70 62  Resp: 14 12  Temp:    SpO2: 100% 99%    Last Pain:  Vitals:   09/24/20 1500  TempSrc:   PainSc: 0-No pain                 Haruo Stepanek

## 2020-09-24 NOTE — Op Note (Signed)
09/24/2020 [facility]                              OPERATIVE REPORT   PREOPERATIVE DIAGNOSIS:  Left carpal tunnel syndrome.  POSTOPERATIVE DIAGNOSIS:  Left carpal tunnel syndrome.  PROCEDURE:  Left carpal tunnel release.  SURGEON:  Leanora Cover, MD  ASSISTANT:  none.  ANESTHESIA: General  IV FLUIDS:  Per anesthesia flow sheet.  ESTIMATED BLOOD LOSS:  Minimal.  COMPLICATIONS:  None.  SPECIMENS:  None.  TOURNIQUET TIME:    Total Tourniquet Time Documented: Upper Arm (Left) - 13 minutes Total: Upper Arm (Left) - 13 minutes   DISPOSITION:  Stable to PACU.  LOCATION: [facility]  INDICATIONS:  75 yo female with numbness and tingling left hand.  Positive nerve conduction studies.  She wishes to have a carpal tunnel release for management of her symptoms.  Risks, benefits and alternatives of surgery were discussed including the risk of blood loss; infection; damage to nerves, vessels, tendons, ligaments, bone; failure of surgery; need for additional surgery; complications with wound healing; continued pain; recurrence of carpal tunnel syndrome; and damage to motor branch. She voiced understanding of these risks and elected to proceed.   OPERATIVE COURSE:  After being identified preoperatively by myself, the patient and I agreed upon the procedure and site of procedure.  The surgical site was marked.  Surgical consent had been signed.  She was given IV Ancef as preoperative antibiotic prophylaxis.  She was transferred to the operating room and placed on the operating room table in supine position with the Left upper extremity on an armboard.  General anesthesia was induced by the anesthesiologist.  Left upper extremity was prepped and draped in normal sterile orthopaedic fashion.  A surgical pause was performed between the surgeons, anesthesia, and operating room staff, and all were in agreement as to the patient, procedure, and site of procedure.  Tourniquet  at the proximal aspect of the extremity was inflated to 250 mmHg after exsanguination of the arm with an Esmarch bandage  Incision was made over the transverse carpal ligament and carried into the subcutaneous tissues by spreading technique.  Bipolar electrocautery was used to obtain hemostasis.  The palmar fascia was sharply incised.  The transverse carpal ligament was identified and sharply incised.  It was incised distally first.  The flexor tendons were identified.  The flexor tendon to the ring finger was identified and retracted radially.  The transverse carpal ligament was then incised proximally.  Scissors were used to split the distal aspect of the volar antebrachial fascia.  A finger was placed into the wound to ensure complete decompression, which was the case.  The nerve was examined.  It was adherent to the radial leaflet.  The motor branch was identified and was intact.  The wound was copiously irrigated with sterile saline.  It was then closed with 4-0 nylon in a horizontal mattress fashion.  It was injected with 0.25% plain Marcaine to aid in postoperative analgesia.  It was dressed with sterile Xeroform, 4x4s, an ABD, and wrapped with Kerlix and an Ace bandage.  Tourniquet was deflated at 13 minutes.  Fingertips were pink with brisk capillary refill after deflation of the tourniquet.  Operative drapes were broken down.  The patient was awoken from anesthesia safely.  She was transferred back to stretcher and taken to the PACU in stable condition.  I will see her back in  the office in 1 week for postoperative followup.  I will give her a prescription for Norco 5/325 1-2 tabs PO q6 hours prn pain, dispense # 20.    Leanora Cover, MD Electronically signed, 09/24/20

## 2020-09-24 NOTE — Discharge Instructions (Addendum)

## 2020-09-25 ENCOUNTER — Encounter (HOSPITAL_BASED_OUTPATIENT_CLINIC_OR_DEPARTMENT_OTHER): Payer: Self-pay | Admitting: Orthopedic Surgery

## 2020-12-20 ENCOUNTER — Telehealth: Payer: Self-pay

## 2020-12-20 ENCOUNTER — Other Ambulatory Visit: Payer: Self-pay | Admitting: Endocrinology

## 2020-12-20 NOTE — Telephone Encounter (Signed)
Noted! Thank you

## 2020-12-20 NOTE — Telephone Encounter (Signed)
Patient has appointment for labs at [facility] and states it is for Dr Dwyane Dee but this location is closer to come in for lab work. Please order labs when able. Patient has an appointment on 12/28/20. Thank you.

## 2020-12-28 ENCOUNTER — Other Ambulatory Visit (INDEPENDENT_AMBULATORY_CARE_PROVIDER_SITE_OTHER): Payer: Federal, State, Local not specified - PPO

## 2020-12-28 ENCOUNTER — Other Ambulatory Visit: Payer: Self-pay

## 2020-12-28 DIAGNOSIS — E890 Postprocedural hypothyroidism: Secondary | ICD-10-CM

## 2020-12-28 LAB — T4, FREE: Free T4: 1.11 ng/dL (ref 0.60–1.60)

## 2020-12-28 LAB — TSH: TSH: 3.14 u[IU]/mL (ref 0.35–4.50)

## 2021-01-01 ENCOUNTER — Ambulatory Visit: Payer: Federal, State, Local not specified - PPO | Admitting: Endocrinology

## 2021-01-01 ENCOUNTER — Other Ambulatory Visit: Payer: Self-pay

## 2021-01-01 ENCOUNTER — Ambulatory Visit (INDEPENDENT_AMBULATORY_CARE_PROVIDER_SITE_OTHER): Payer: Federal, State, Local not specified - PPO | Admitting: Endocrinology

## 2021-01-01 VITALS — BP 118/62 | HR 80 | Ht 59.5 in | Wt 149.8 lb

## 2021-01-01 DIAGNOSIS — E890 Postprocedural hypothyroidism: Secondary | ICD-10-CM | POA: Diagnosis not present

## 2021-01-01 MED ORDER — LEVOTHYROXINE SODIUM 75 MCG PO TABS: 75.0000 ug | ORAL_TABLET | Freq: Every day | ORAL | 1 refills | Status: DC

## 2021-01-01 NOTE — Progress Notes (Signed)
Patient ID: Kelsey Yoder, female   DOB: 01/26/1946, 75 y.o.   MRN: [id number]                                                                                                               Reason for Appointment:  Post radioactive iodine hypothyroidism, follow-up    Referring physician: Danise Mina    History of Present Illness:   Prior to the consultation she had a history for the prior 2-3 months  of palpitations with her heart racing at times especially at night She was also having ankle swelling but no other typical symptoms of hyperthyroidism including weight loss.  Her labs showed that she had a positive result for thyrotropin receptor antibody measuring 13.5  Since she had continued need for antithyroid drugs she was given I-131 treatment on 10/24/16 with 12 mCi, her uptake was 82% Her methimazole was stopped on her visit on 11/14/16 when her free T4 was 0.71  In 04/2017 she was found to be hypothyroid with free T4 0.53 and was started on levothyroxine 25 g daily  RECENT history:  She has been on levothyroxine supplementation since 04/2017  She usually does not complain of symptoms of unusual fatigue when her TSH is high  Previously had needed periodic adjustment of her levothyroxine doses  Her LEVOTHYROXINE in 2020 was changed, taking 75 mcg with skipping the tablet on Saturdays since then She was last seen about a year ago  She says she feels good with no fatigue and no palpitations  She is consistent with taking the levothyroxine consistently before breakfast although she felt a little tired with some palpitations when she could not get it from the pharmacy in December She takes her multivitamins and calcium about 6 hours later  TSH previously was 6.1 when she ran out of her medication for 3 days but now it is back to 3.1 She has not missed any doses lately   Wt Readings from Last 3 Encounters:  01/01/21 149 lb 12.8 oz (67.9 kg)  09/24/20 147 lb 0.8 oz (66.7  kg)  08/27/20 146 lb 3.2 oz (66.3 kg)    Thyroid levels from recent labs are:   Lab Results  Component Value Date   TSH 3.14 12/28/2020   TSH 4.47 06/29/2020   TSH 6.117 (H) 06/14/2020   FREET4 1.11 12/28/2020   FREET4 0.88 06/29/2020   FREET4 0.97 06/14/2020          Allergies as of 01/01/2021   No Known Allergies      Medication List        Accurate as of January 01, 2021 10:49 AM. If you have any questions, ask your nurse or doctor.          Calcium Carb-Cholecalciferol 600-800 MG-UNIT Tabs Take 1 tablet by mouth daily.   ELDERBERRY PO Take by mouth daily at 12 noon.   HYDROcodone-acetaminophen 5-325 MG tablet Commonly known as: Norco 1-2 tabs po q6 hours prn pain   levothyroxine 75 MCG tablet Commonly known as:  SYNTHROID Take 1 tablet (75 mcg total) by mouth daily before breakfast. Skip dose once a week   multivitamin tablet Take 1 tablet by mouth daily.   Turmeric 500 MG Caps Take by mouth.   Vitamin D3 50 MCG (2000 UT) capsule Take 1 capsule by mouth daily.            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   Cataract    Hyperthyroidism 01/07/2016   Dr Dwyane Dee, positive TSHR Ab   Osteopenia    PONV (postoperative nausea and vomiting)    post-op nausea   Sinus drainage     Past Surgical History:  Procedure Laterality Date   BREAST BIOPSY Right 2010   benign    BREAST EXCISIONAL BIOPSY Right 2010   benign   CARPAL TUNNEL RELEASE Right 04/25/2013   CARPAL TUNNEL RELEASE;  Surgeon: Tennis Must, MD;  Location: [facility] RELEASE Left 09/24/2020   Procedure: LEFT CARPAL TUNNEL RELEASE;  Surgeon: Leanora Cover, MD;  Location: [place];  Service: Orthopedics;  Laterality: Left;  25 MINUTES   CATARACT EXTRACTION Bilateral 2013   COLONOSCOPY  2002   rectosigmoid bulge thought 2/2 fibroids   COLONOSCOPY   2005   polyps, diverticulosis, rpt 7-10 yrs ([facility])   COLONOSCOPY  08/2015   HP, mild diverticulosis, rpt 5 yrs  ([facility])   DENTAL SURGERY     dental implants   ESOPHAGOGASTRODUODENOSCOPY  2005   antral gastropathy, neg H pylori screen ([facility])   TONSILLECTOMY  1976   TOTAL ABDOMINAL HYSTERECTOMY W/ BILATERAL SALPINGOOPHORECTOMY  2008   h/o abnormal pap smears    Family History  Problem Relation Age of Onset   Diabetes Mother        double amputee   Congestive Heart Failure Mother    Diabetes Father    Stroke Sister    Lupus Sister        X 2   Diabetes Brother    Cancer Brother        duodenal, prostate    Stroke Maternal Grandmother    Stroke Maternal Grandfather    Breast cancer Sister 76   Colon cancer Neg Hx    Colon polyps Neg Hx    Thyroid disease Neg Hx     Social History:  reports that she has never smoked. She has never used smokeless tobacco. She reports that she does not drink alcohol and does not use drugs.  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

## 2021-01-28 ENCOUNTER — Other Ambulatory Visit: Payer: Self-pay | Admitting: Family Medicine

## 2021-01-28 DIAGNOSIS — Z1231 Encounter for screening mammogram for malignant neoplasm of breast: Secondary | ICD-10-CM

## 2021-02-04 ENCOUNTER — Ambulatory Visit
Admission: RE | Admit: 2021-02-04 | Discharge: 2021-02-04 | Disposition: A | Discharge status: Home / Self Care | Payer: Federal, State, Local not specified - PPO | Source: Ambulatory Visit

## 2021-02-04 ENCOUNTER — Encounter: Payer: Self-pay | Admitting: Internal Medicine

## 2021-02-04 ENCOUNTER — Other Ambulatory Visit: Payer: Self-pay

## 2021-02-04 DIAGNOSIS — Z1231 Encounter for screening mammogram for malignant neoplasm of breast: Secondary | ICD-10-CM

## 2021-06-21 ENCOUNTER — Encounter: Payer: Self-pay | Admitting: Internal Medicine

## 2021-06-27 ENCOUNTER — Telehealth: Payer: Self-pay | Admitting: *Deleted

## 2021-06-27 NOTE — Telephone Encounter (Signed)
Please place future orders for lab appt.  

## 2021-06-29 ENCOUNTER — Other Ambulatory Visit: Payer: Self-pay | Admitting: Family Medicine

## 2021-06-29 DIAGNOSIS — E785 Hyperlipidemia, unspecified: Secondary | ICD-10-CM

## 2021-06-29 DIAGNOSIS — E559 Vitamin D deficiency, unspecified: Secondary | ICD-10-CM

## 2021-06-29 DIAGNOSIS — E890 Postprocedural hypothyroidism: Secondary | ICD-10-CM

## 2021-06-29 NOTE — Telephone Encounter (Signed)
Labs ordered.

## 2021-07-04 ENCOUNTER — Other Ambulatory Visit: Payer: Self-pay

## 2021-07-04 ENCOUNTER — Other Ambulatory Visit (INDEPENDENT_AMBULATORY_CARE_PROVIDER_SITE_OTHER): Payer: Federal, State, Local not specified - PPO

## 2021-07-04 ENCOUNTER — Other Ambulatory Visit: Payer: Federal, State, Local not specified - PPO

## 2021-07-04 DIAGNOSIS — E785 Hyperlipidemia, unspecified: Secondary | ICD-10-CM | POA: Diagnosis not present

## 2021-07-04 DIAGNOSIS — E890 Postprocedural hypothyroidism: Secondary | ICD-10-CM

## 2021-07-04 DIAGNOSIS — E559 Vitamin D deficiency, unspecified: Secondary | ICD-10-CM

## 2021-07-05 LAB — COMPREHENSIVE METABOLIC PANEL
ALT: 15 U/L (ref 0–35)
AST: 21 U/L (ref 0–37)
Albumin: 3.9 g/dL (ref 3.5–5.2)
Alkaline Phosphatase: 96 U/L (ref 39–117)
BUN: 17 mg/dL (ref 6–23)
CO2: 29 mEq/L (ref 19–32)
Calcium: 9 mg/dL (ref 8.4–10.5)
Chloride: 105 mEq/L (ref 96–112)
Creatinine, Ser: 0.97 mg/dL (ref 0.40–1.20)
GFR: 58.07 mL/min — ABNORMAL LOW (ref >60.00)
Glucose, Bld: 89 mg/dL (ref 70–99)
Potassium: 3.9 mEq/L (ref 3.5–5.1)
Sodium: 140 mEq/L (ref 135–145)
Total Bilirubin: 0.4 mg/dL (ref 0.2–1.2)
Total Protein: 7.2 g/dL (ref 6.0–8.3)

## 2021-07-05 LAB — LIPID PANEL
Cholesterol: 230 mg/dL — ABNORMAL HIGH (ref 0–200)
HDL: 50.3 mg/dL (ref >39.00)
LDL Cholesterol: 152 mg/dL — ABNORMAL HIGH (ref 0–99)
NonHDL: 179.32
Total CHOL/HDL Ratio: 5
Triglycerides: 136 mg/dL (ref 0.0–149.0)
VLDL: 27.2 mg/dL (ref 0.0–40.0)

## 2021-07-05 LAB — TSH: TSH: 4.45 u[IU]/mL (ref 0.35–5.50)

## 2021-07-05 LAB — VITAMIN D 25 HYDROXY (VIT D DEFICIENCY, FRACTURES): VITD: 44.64 ng/mL (ref 30.00–100.00)

## 2021-07-05 LAB — T4, FREE: Free T4: 0.95 ng/dL (ref 0.60–1.60)

## 2021-07-09 ENCOUNTER — Encounter: Payer: Self-pay | Admitting: Radiology

## 2021-07-11 NOTE — Progress Notes (Signed)
Patient ID: Kelsey Yoder, female    DOB: 03-25-1946, 75 y.o.   MRN: [id number]  This visit was conducted in person.  BP 140/68    Pulse 96    Temp 97.9 F (36.6 C) (Temporal)    Ht 4' 11.5" (1.511 m)    Wt 152 lb 2 oz (69 kg)    SpO2 96%    BMI 30.21 kg/m    CC: CPE Subjective:   HPI: Kelsey Yoder is a 75 y.o. female presenting on 07/12/2021 for Annual Exam   Did not see health advisor - does not have medicare.   No results found.  [place] [facility] Visit from 07/12/2021 in [place] at [facility] ([place]) Total Score 0       No flowsheet data found.   S/p L carpal tunnel release Kelsey Yoder) 09/2020, recovered well.  Saw Kelsey Yoder for cervical radiculopathy s/p PT course with benefit.  Intermittent R meralgia paresthetica worse at night - manages with gabapentin 600mg  at night PRN - hasn't recently needed.   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 No thyroid symptoms.   Ongoing sinus and head congestion for weeks. Chronic post nasal drainage and sinus pressure pain. Worse with weather changes. Manages with tylenol sinus. No fevers, ear pain.  Preventative: COLONOSCOPY 08/2015; HP, mild diverticulosis, rpt 5 yrs  (Kelsey Yoder) - upcoming scheduled 08/2021.  Well woman - OBGYN 08/2020 (Kelsey Yoder). S/p total hysterectomy 2008 for fibroids and abnormal pap smears, ovaries removed as well.  Mammogram 01/2021, [facility] @ [facility] center DEXA - 11/2016 - osteopenia, T -1.8 at hip. Regular with calcium and vit D and weight bearing exercise.  Lung cancer screening - not eligible  Flu shot today Jenera 08/2019, 09/2019, booster 12/2020  Td 2000, 2010. declines repeat Pneumovax 2014. Prevnar-13 05/2014.  Zostavax 2012  Shingrix - discussed, declines  Advanced directive: living will in chart 06/2014 - no prolonged life support if terminal condition. Would want HCPOA to be sister  Kelsey Yoder. HCPOA form provided previously.  Seat belt use discussed Sunscreen use discussed. 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  Diet: some water, fruits and vegetables daily      Relevant past medical, surgical, family and social history reviewed and updated as indicated. Interim medical history since our last visit reviewed. Allergies and medications reviewed and updated. Outpatient Medications Prior to Visit  Medication Sig Dispense Refill   Calcium Carb-Cholecalciferol 600-800 MG-UNIT TABS Take 1 tablet by mouth daily.     Cholecalciferol (VITAMIN D3) 50 MCG (2000 UT) capsule Take 1 capsule by mouth daily.     ELDERBERRY PO Take by mouth daily at 12 noon.     levothyroxine (SYNTHROID) 75 MCG tablet Take 1 tablet (75 mcg total) by mouth daily before breakfast. Skip dose once a week 90 tablet 1   Multiple Vitamin (MULTIVITAMIN) tablet Take 1 tablet by mouth daily.     Turmeric 500 MG CAPS Take by mouth.     No facility-administered medications prior to visit.  Per HPI unless specifically indicated in ROS section below Review of Systems  Constitutional:  Negative for activity change, appetite change, chills, fatigue, fever and unexpected weight change.  HENT:  Negative for hearing loss.   Eyes:  Negative for visual disturbance.  Respiratory:  Negative for cough, chest tightness, shortness of breath and wheezing.   Cardiovascular:  Negative for chest pain, palpitations and leg swelling.  Gastrointestinal:  Negative for abdominal distention, abdominal pain, blood in stool, constipation, diarrhea, nausea and vomiting.   Genitourinary:  Negative for difficulty urinating and hematuria.  Musculoskeletal:  Negative for arthralgias, myalgias and neck pain.  Skin:  Negative for rash.  Neurological:  Negative for dizziness, seizures, syncope and headaches.  Hematological:  Negative for adenopathy. Does not bruise/bleed easily.  Psychiatric/Behavioral:  Negative for dysphoric mood. The patient is not nervous/anxious.    Objective:  BP 140/68    Pulse 96    Temp 97.9 F (36.6 C) (Temporal)    Ht 4' 11.5" (1.511 m)    Wt 152 lb 2 oz (69 kg)    SpO2 96%    BMI 30.21 kg/m   Wt Readings from Last 3 Encounters:  07/12/21 152 lb 2 oz (69 kg)  01/01/21 149 lb 12.8 oz (67.9 kg)  09/24/20 147 lb 0.8 oz (66.7 kg)      Physical Exam Vitals and nursing note reviewed.  Constitutional:      Appearance: Normal appearance. She is not ill-appearing.  HENT:     Head: Normocephalic and atraumatic.     Right Ear: Tympanic membrane, ear canal and external ear normal. There is no impacted cerumen.     Left Ear: Tympanic membrane, ear canal and external ear normal. There is no impacted cerumen.  Eyes:     General:        Right eye: No discharge.        Left eye: No discharge.     Extraocular Movements: Extraocular movements intact.     Conjunctiva/sclera: Conjunctivae normal.     Pupils: Pupils are equal, round, and reactive to light.  Neck:     Thyroid: No thyroid mass or thyromegaly.     Vascular: No carotid bruit.  Cardiovascular:     Rate and Rhythm: Normal rate and regular rhythm.     Pulses: Normal pulses.     Heart sounds: Normal heart sounds. No murmur heard. Pulmonary:     Effort: Pulmonary effort is normal. No respiratory distress.     Breath sounds: Normal breath sounds. No wheezing, rhonchi or rales.  Abdominal:     General: Bowel sounds are normal. There is no distension.     Palpations: Abdomen is soft. There is no mass.     Tenderness: There is no abdominal tenderness. There is no guarding or rebound.      Hernia: No hernia is present.  Musculoskeletal:     Cervical back: Normal range of motion and neck supple. No rigidity.     Right lower leg: No edema.     Left lower leg: No edema.  Lymphadenopathy:     Cervical: No cervical adenopathy.  Skin:    General: Skin is warm and dry.     Findings: No rash.  Neurological:     General: No focal deficit present.     Mental Status: She is alert. Mental status is at baseline.  Psychiatric:        Mood and Affect: Mood normal.        Behavior:  Behavior normal.      Results for orders placed or performed in visit on 07/04/21  T4, free  Result Value Ref Range   Free T4 0.95 0.60 - 1.60 ng/dL  TSH  Result Value Ref Range   TSH 4.45 0.35 - 5.50 uIU/mL  Lipid panel  Result Value Ref Range   Cholesterol 230 (H) 0 - 200 mg/dL   Triglycerides 136.0 0.0 - 149.0 mg/dL   HDL 50.30 >39.00 mg/dL   VLDL 27.2 0.0 - 40.0 mg/dL   LDL Cholesterol 152 (H) 0 - 99 mg/dL   Total CHOL/HDL Ratio 5    NonHDL 179.32   Comprehensive metabolic panel  Result Value Ref Range   Sodium 140 135 - 145 mEq/L   Potassium 3.9 3.5 - 5.1 mEq/L   Chloride 105 96 - 112 mEq/L   CO2 29 19 - 32 mEq/L   Glucose, Bld 89 70 - 99 mg/dL   BUN 17 6 - 23 mg/dL   Creatinine, Ser 0.97 0.40 - 1.20 mg/dL   Total Bilirubin 0.4 0.2 - 1.2 mg/dL   Alkaline Phosphatase 96 39 - 117 U/L   AST 21 0 - 37 U/L   ALT 15 0 - 35 U/L   Total Protein 7.2 6.0 - 8.3 g/dL   Albumin 3.9 3.5 - 5.2 g/dL   GFR 58.07 (L) >60.00 mL/min   Calcium 9.0 8.4 - 10.5 mg/dL  VITAMIN D 25 Hydroxy (Vit-D Deficiency, Fractures)  Result Value Ref Range   VITD 44.64 30.00 - 100.00 ng/mL    Assessment & Plan:  This visit occurred during the SARS-CoV-2 public health emergency.  Safety protocols were in place, including screening questions prior to the visit, additional usage of staff PPE, and extensive cleaning of exam room while observing appropriate contact time as indicated for disinfecting solutions.    Problem List Items Addressed This Visit     Health maintenance examination - Primary (Chronic)    Preventative protocols reviewed and updated unless pt declined. Discussed healthy diet and lifestyle.       Hyperlipidemia    Chronic off meds. Encouraged healthy diet choices to improve cholesterol control. fmhx CVA (brother and sister).  Declines statin even weekly dosing.  Agrees to retest in 6 months.  The 10-year ASCVD risk score (Arnett DK, et al., 2019) is: 18.4%   Values used to calculate the score:     Age: 73 years     Sex: Female     Is Non-Hispanic African American: Yes     Diabetic: No     Tobacco smoker: No     Systolic Blood Pressure: 160 mmHg     Is BP treated: No     HDL Cholesterol: 50.3 mg/dL     Total Cholesterol: 230 mg/dL       Relevant Orders   Lipid panel   Osteopenia of neck of left femur    Will order DEXA scan to complete when she schedules mammogram in 2023.       Relevant Orders   DG Bone Density   Vitamin D deficiency    Levels repleted well.       Postablative hypothyroidism    Sees endo, TSH and fT4 stable on current regimen.       Post-nasal drainage    Anticipate allergic. rec trial flonase, watching for epistaxis.       Other Visit Diagnoses     Need for influenza vaccination       Relevant Orders  Flu Vaccine QUAD High Dose(Fluad) (Completed)        No orders of the defined types were placed in this encounter.  Orders Placed This Encounter  Procedures   DG Bone Density    Standing Status:   Future    Standing Expiration Date:   07/12/2022    Order Specific Question:   Reason for Exam (SYMPTOM  OR DIAGNOSIS REQUIRED)    Answer:   osteopenia    Order Specific Question:   Preferred imaging location?    [facility]   Flu Vaccine QUAD High Dose(Fluad)   Lipid panel    Standing Status:   Future    Standing Expiration Date:   07/12/2022     Patient instructions: Flu shot today.  Consider shingrix  vaccine series.  I have ordered bone density scan. When you call to schedule your mammogram, ask to also have bone density scan done.  Cholesterol levels were elevated - work on low cholesterol diet and return in 6 months for lab visit only to recheck cholesterol levels.  Good tosee you today  Return as needed or in 1 year for next physical.   Follow up plan: Return in about 1 year (around 07/12/2022), or if symptoms worsen or fail to improve, for annual exam, prior fasting for blood work.  Ria Bush, MD

## 2021-07-12 ENCOUNTER — Ambulatory Visit (INDEPENDENT_AMBULATORY_CARE_PROVIDER_SITE_OTHER): Payer: Federal, State, Local not specified - PPO | Admitting: Family Medicine

## 2021-07-12 ENCOUNTER — Encounter: Payer: Self-pay | Admitting: Family Medicine

## 2021-07-12 ENCOUNTER — Other Ambulatory Visit: Payer: Self-pay

## 2021-07-12 VITALS — BP 140/68 | HR 96 | Temp 97.9°F | Ht 59.5 in | Wt 152.1 lb

## 2021-07-12 DIAGNOSIS — E7800 Pure hypercholesterolemia, unspecified: Secondary | ICD-10-CM

## 2021-07-12 DIAGNOSIS — M85852 Other specified disorders of bone density and structure, left thigh: Secondary | ICD-10-CM | POA: Diagnosis not present

## 2021-07-12 DIAGNOSIS — E890 Postprocedural hypothyroidism: Secondary | ICD-10-CM

## 2021-07-12 DIAGNOSIS — Z0000 Encounter for general adult medical examination without abnormal findings: Secondary | ICD-10-CM | POA: Diagnosis not present

## 2021-07-12 DIAGNOSIS — R0982 Postnasal drip: Secondary | ICD-10-CM

## 2021-07-12 DIAGNOSIS — Z23 Encounter for immunization: Secondary | ICD-10-CM | POA: Diagnosis not present

## 2021-07-12 DIAGNOSIS — E559 Vitamin D deficiency, unspecified: Secondary | ICD-10-CM

## 2021-07-12 NOTE — Assessment & Plan Note (Addendum)
Chronic off meds. Encouraged healthy diet choices to improve cholesterol control. fmhx CVA (brother and sister).  Declines statin even weekly dosing.  Agrees to retest in 6 months.  The 10-year ASCVD risk score (Arnett DK, et al., 2019) is: 18.4%   Values used to calculate the score:     Age: 75 years     Sex: Female     Is Non-Hispanic African American: Yes     Diabetic: No     Tobacco smoker: No     Systolic Blood Pressure: 144 mmHg     Is BP treated: No     HDL Cholesterol: 50.3 mg/dL     Total Cholesterol: 230 mg/dL

## 2021-07-12 NOTE — Assessment & Plan Note (Signed)
Sees endo, TSH and fT4 stable on current regimen.

## 2021-07-12 NOTE — Patient Instructions (Addendum)
Flu shot today.  Consider shingrix vaccine series.  I have ordered bone density scan. When you call to schedule your mammogram, ask to also have bone density scan done.  Cholesterol levels were elevated - work on low cholesterol diet and return in 6 months for lab visit only to recheck cholesterol levels.  Good tosee you today  Return as needed or in 1 year for next physical.   Health Maintenance After Age 73 After age 50, you are at a higher risk for certain long-term diseases and infections as well as injuries from falls. Falls are a major cause of broken bones and head injuries in people who are older than age 84. Getting regular preventive care can help to keep you healthy and well. Preventive care includes getting regular testing and making lifestyle changes as recommended by your health care provider. Talk with your health care provider about: Which screenings and tests you should have. A screening is a test that checks for a disease when you have no symptoms. A diet and exercise plan that is right for you. What should I know about screenings and tests to prevent falls? Screening and testing are the best ways to find a health problem early. Early diagnosis and treatment give you the best chance of managing medical conditions that are common after age 81. Certain conditions and lifestyle choices may make you more likely to have a fall. Your health care provider may recommend: Regular vision checks. Poor vision and conditions such as cataracts can make you more likely to have a fall. If you wear glasses, make sure to get your prescription updated if your vision changes. Medicine review. Work with your health care provider to regularly review all of the medicines you are taking, including over-the-counter medicines. Ask your health care provider about any side effects that may make you more likely to have a fall. Tell your health care provider if any medicines that you take make you feel dizzy or  sleepy. Strength and balance checks. Your health care provider may recommend certain tests to check your strength and balance while standing, walking, or changing positions. Foot health exam. Foot pain and numbness, as well as not wearing proper footwear, can make you more likely to have a fall. Screenings, including: Osteoporosis screening. Osteoporosis is a condition that causes the bones to get weaker and break more easily. Blood pressure screening. Blood pressure changes and medicines to control blood pressure can make you feel dizzy. Depression screening. You may be more likely to have a fall if you have a fear of falling, feel depressed, or feel unable to do activities that you used to do. Alcohol use screening. Using too much alcohol can affect your balance and may make you more likely to have a fall. Follow these instructions at home: Lifestyle Do not drink alcohol if: Your health care provider tells you not to drink. If you drink alcohol: Limit how much you have to: 0-1 drink a day for women. 0-2 drinks a day for men. Know how much alcohol is in your drink. In the [place] one drink equals one 12 oz bottle of beer (355 mL), one 5 oz glass of wine (148 mL), or one 1 oz glass of hard liquor (44 mL). Do not use any products that contain nicotine or tobacco. These products include cigarettes, chewing tobacco, and vaping devices, such as e-cigarettes. If you need help quitting, ask your health care provider. Activity  Follow a regular exercise program to stay fit. This  will help you maintain your balance. Ask your health care provider what types of exercise are appropriate for you. If you need a cane or walker, use it as recommended by your health care provider. Wear supportive shoes that have nonskid soles. Safety  Remove any tripping hazards, such as rugs, cords, and clutter. Install safety equipment such as grab bars in bathrooms and safety rails on stairs. Keep rooms and walkways  well-lit. General instructions Talk with your health care provider about your risks for falling. Tell your health care provider if: You fall. Be sure to tell your health care provider about all falls, even ones that seem minor. You feel dizzy, tiredness (fatigue), or off-balance. Take over-the-counter and prescription medicines only as told by your health care provider. These include supplements. Eat a healthy diet and maintain a healthy weight. A healthy diet includes low-fat dairy products, low-fat (lean) meats, and fiber from whole grains, beans, and lots of fruits and vegetables. Stay current with your vaccines. Schedule regular health, dental, and eye exams. Summary Having a healthy lifestyle and getting preventive care can help to protect your health and wellness after age 56. Screening and testing are the best way to find a health problem early and help you avoid having a fall. Early diagnosis and treatment give you the best chance for managing medical conditions that are more common for people who are older than age 75. Falls are a major cause of broken bones and head injuries in people who are older than age 74. Take precautions to prevent a fall at home. Work with your health care provider to learn what changes you can make to improve your health and wellness and to prevent falls. This information is not intended to replace advice given to you by your health care provider. Make sure you discuss any questions you have with your health care provider. Document Revised: 11/19/2020 Document Reviewed: 11/19/2020 Elsevier Patient Education  [place].

## 2021-07-12 NOTE — Assessment & Plan Note (Signed)
Levels repleted well.

## 2021-07-12 NOTE — Assessment & Plan Note (Signed)
Anticipate allergic. rec trial flonase, watching for epistaxis.

## 2021-07-12 NOTE — Assessment & Plan Note (Signed)
Preventative protocols reviewed and updated unless pt declined. Discussed healthy diet and lifestyle.  

## 2021-07-12 NOTE — Assessment & Plan Note (Signed)
Will order DEXA scan to complete when she schedules mammogram in 2023.

## 2021-07-22 ENCOUNTER — Other Ambulatory Visit: Payer: Self-pay | Admitting: Endocrinology

## 2021-08-06 ENCOUNTER — Other Ambulatory Visit: Payer: Self-pay

## 2021-08-06 ENCOUNTER — Ambulatory Visit (AMBULATORY_SURGERY_CENTER): Payer: Federal, State, Local not specified - PPO | Admitting: *Deleted

## 2021-08-06 VITALS — Ht 60.0 in | Wt 151.0 lb

## 2021-08-06 DIAGNOSIS — Z86010 Personal history of colonic polyps: Secondary | ICD-10-CM

## 2021-08-06 MED ORDER — PLENVU 140 G PO SOLR: 1.0000 | ORAL | 0 refills | Status: DC

## 2021-08-06 NOTE — Progress Notes (Signed)
No egg or soy allergy known to patient  No issues known to pt with past sedation with any surgeries or procedures Patient denies ever being told they had issues or difficulty with intubation  No FH of Malignant Hyperthermia Pt is not on diet pills Pt is not on  home 02  Pt is not on blood thinners  Pt denies issues with constipation  No A fib or A flutter  Pt is fully vaccinated  for Covid   Plenvu Coupon to pt in PV today , Code to Pharmacy and  NO PA's for preps discussed with pt In PV today  Discussed with pt there will be an out-of-pocket cost for prep and that varies from $0 to 70 +  dollars - pt verbalized understanding   Due to the COVID-19 pandemic we are asking patients to follow certain guidelines in PV and the [facility]   Pt aware of COVID protocols and [facility] guidelines   PV completed over the phone. Pt verified name, DOB, address and insurance during PV today.  Pt mailed instruction packet with copy of consent form to read and not return, and instructions.  Pt encouraged to call with questions or issues.  If pt has My chart, procedure instructions sent via My Chart

## 2021-08-07 ENCOUNTER — Other Ambulatory Visit: Payer: Self-pay | Admitting: Family Medicine

## 2021-08-07 DIAGNOSIS — Z1231 Encounter for screening mammogram for malignant neoplasm of breast: Secondary | ICD-10-CM

## 2021-08-09 ENCOUNTER — Telehealth: Payer: Self-pay | Admitting: Internal Medicine

## 2021-08-09 DIAGNOSIS — Z86010 Personal history of colonic polyps: Secondary | ICD-10-CM

## 2021-08-09 MED ORDER — NA SULFATE-K SULFATE-MG SULF 17.5-3.13-1.6 GM/177ML PO SOLN: 1.0000 | ORAL | 0 refills | Status: DC

## 2021-08-09 NOTE — Telephone Encounter (Signed)
Patient called states the Plenvu medication is too expensive seeking an alternative.

## 2021-08-09 NOTE — Telephone Encounter (Signed)
Spoke with the pharmacy-plenvu coupon would not work. Per pharmacist suprep $0, switched to suprep- New prep instructions sent to mychart. Pt aware.

## 2021-08-14 HISTORY — PX: COLONOSCOPY: SHX174

## 2021-08-18 ENCOUNTER — Encounter: Payer: Self-pay | Admitting: Family Medicine

## 2021-08-20 ENCOUNTER — Encounter: Payer: Federal, State, Local not specified - PPO | Admitting: Internal Medicine

## 2021-08-23 ENCOUNTER — Encounter: Payer: Federal, State, Local not specified - PPO | Admitting: Internal Medicine

## 2021-08-23 NOTE — Telephone Encounter (Signed)
Chart updated

## 2021-08-26 ENCOUNTER — Encounter: Payer: Federal, State, Local not specified - PPO | Admitting: Internal Medicine

## 2021-08-30 ENCOUNTER — Ambulatory Visit (AMBULATORY_SURGERY_CENTER): Payer: Federal, State, Local not specified - PPO | Admitting: Internal Medicine

## 2021-08-30 ENCOUNTER — Encounter: Payer: Self-pay | Admitting: Internal Medicine

## 2021-08-30 ENCOUNTER — Other Ambulatory Visit: Payer: Self-pay

## 2021-08-30 VITALS — BP 117/65 | HR 64 | Temp 98.9°F | Resp 18 | Ht 59.5 in | Wt 151.0 lb

## 2021-08-30 DIAGNOSIS — Z86010 Personal history of colonic polyps: Secondary | ICD-10-CM | POA: Diagnosis not present

## 2021-08-30 MED ORDER — SODIUM CHLORIDE 0.9 % IV SOLN: 500.0000 mL | INTRAVENOUS | Status: DC

## 2021-08-30 NOTE — Op Note (Signed)
[facility] Patient Name: Kelsey Yoder Procedure Date: 08/30/2021 8:53 AM MRN: [id number] Endoscopist: Jerene Bears , MD Age: 76 Referring MD:  Date of Birth: 1945/11/05 Gender: Female Account #: [id number] Procedure:                Colonoscopy Indications:              High risk colon cancer surveillance: Personal                            history of colonic polyp ("hyperplastic) polyp                            removed from right-colon suspicious for SSP, Last                            colonoscopy: February 2017 Medicines:                Monitored Anesthesia Care Procedure:                Pre-Anesthesia Assessment:                           - Prior to the procedure, a History and Physical                            was performed, and patient medications and                            allergies were reviewed. The patient's tolerance of                            previous anesthesia was also reviewed. The risks                            and benefits of the procedure and the sedation                            options and risks were discussed with the patient.                            All questions were answered, and informed consent                            was obtained. Prior Anticoagulants: The patient has                            taken no previous anticoagulant or antiplatelet                            agents. ASA Grade Assessment: II - A patient with                            mild systemic disease. After reviewing the risks  and benefits, the patient was deemed in                            satisfactory condition to undergo the procedure.                           After obtaining informed consent, the colonoscope                            was passed under direct vision. Throughout the                            procedure, the patient's blood pressure, pulse, and                            oxygen saturations were monitored  continuously. The                            CF [id number] #[id number] was introduced through the anus                            and advanced to the cecum, identified by                            appendiceal orifice and ileocecal valve. The                            colonoscopy was performed without difficulty. The                            patient tolerated the procedure well. The quality                            of the bowel preparation was good. The ileocecal                            valve, appendiceal orifice, and rectum were                            photographed. Scope In: 9:14:48 AM Scope Out: 9:25:05 AM Scope Withdrawal Time: 0 hours 8 minutes 20 seconds  Total Procedure Duration: 0 hours 10 minutes 17 seconds  Findings:                 The digital rectal exam was normal.                           Multiple small and large-mouthed diverticula were                            found in the sigmoid colon, descending colon and                            ascending colon.  Internal hemorrhoids were found during                            retroflexion. The hemorrhoids were small.                           The exam was otherwise without abnormality. Complications:            No immediate complications. Estimated Blood Loss:     Estimated blood loss: none. Impression:               - Diverticulosis in the sigmoid colon, in the                            descending colon and in the ascending colon.                           - Small internal hemorrhoids.                           - The examination was otherwise normal.                           - No specimens collected. Recommendation:           - Patient has a contact number available for                            emergencies. The signs and symptoms of potential                            delayed complications were discussed with the                            patient. Return to normal activities tomorrow.                             Written discharge instructions were provided to the                            patient.                           - Resume previous diet.                           - Continue present medications.                           - No repeat colonoscopy due to age at next                            surveillance interval and the absence of colonic                            polyps on today's exam. Jerene Bears, MD 08/30/2021 9:30:05 AM This report has been signed electronically.

## 2021-08-30 NOTE — Patient Instructions (Signed)
Handout on diverticulosis and hemorrhoids given.    YOU HAD AN ENDOSCOPIC PROCEDURE TODAY AT [facility]:   Refer to the procedure report that was given to you for any specific questions about what was found during the examination.  If the procedure report does not answer your questions, please call your gastroenterologist to clarify.  If you requested that your care partner not be given the details of your procedure findings, then the procedure report has been included in a sealed envelope for you to review at your convenience later.  YOU SHOULD EXPECT: Some feelings of bloating in the abdomen. Passage of more gas than usual.  Walking can help get rid of the air that was put into your GI tract during the procedure and reduce the bloating. If you had a lower endoscopy (such as a colonoscopy or flexible sigmoidoscopy) you may notice spotting of blood in your stool or on the toilet paper. If you underwent a bowel prep for your procedure, you may not have a normal bowel movement for a few days.  Please Note:  You might notice some irritation and congestion in your nose or some drainage.  This is from the oxygen used during your procedure.  There is no need for concern and it should clear up in a day or so.  SYMPTOMS TO REPORT IMMEDIATELY:  Following lower endoscopy (colonoscopy or flexible sigmoidoscopy):  Excessive amounts of blood in the stool  Significant tenderness or worsening of abdominal pains  Swelling of the abdomen that is new, acute  Fever of 100F or higher  For urgent or emergent issues, a gastroenterologist can be reached at any hour by calling [phone]. Do not use MyChart messaging for urgent concerns.    DIET:  We do recommend a small meal at first, but then you may proceed to your regular diet.  Drink plenty of fluids but you should avoid alcoholic beverages for 24 hours.  ACTIVITY:  You should plan to take it easy for the rest of today and you should NOT  DRIVE or use heavy machinery until tomorrow (because of the sedation medicines used during the test).    FOLLOW UP: Our staff will call the number listed on your records 48-72 hours following your procedure to check on you and address any questions or concerns that you may have regarding the information given to you following your procedure. If we do not reach you, we will leave a message.  We will attempt to reach you two times.  During this call, we will ask if you have developed any symptoms of COVID 19. If you develop any symptoms (ie: fever, flu-like symptoms, shortness of breath, cough etc.) before then, please call [phone].  If you test positive for Covid 19 in the 2 weeks post procedure, please call and report this information to [place].    If any biopsies were taken you will be contacted by phone or by letter within the next 1-3 weeks.  Please call us at [phone] if you have not heard about the biopsies in 3 weeks.    SIGNATURES/CONFIDENTIALITY: You and/or your care partner have signed paperwork which will be entered into your electronic medical record.  These signatures attest to the fact that that the information above on your After Visit Summary has been reviewed and is understood.  Full responsibility of the confidentiality of this discharge information lies with you and/or your care-partner.

## 2021-08-30 NOTE — Progress Notes (Signed)
VS by CNW

## 2021-08-30 NOTE — Progress Notes (Signed)
To Pacu, VSS. Report to [email or web address] 

## 2021-08-30 NOTE — Progress Notes (Signed)
GASTROENTEROLOGY PROCEDURE H&P NOTE   Primary Care Physician: Ria Bush, MD    Reason for Procedure:  History of colon polyp  Plan:    Colonoscopy  Patient is appropriate for endoscopic procedure(s) in the ambulatory ([place]) setting.  The nature of the procedure, as well as the risks, benefits, and alternatives were carefully and thoroughly reviewed with the patient. Ample time for discussion and questions allowed. The patient understood, was satisfied, and agreed to proceed.     HPI: Kelsey Yoder is a 76 y.o. female who presents for colonoscopy.  Medical history as below.  Tolerated the prep.  No recent chest pain or shortness of breath.  No abdominal pain today.  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   Cataract    Hyperlipidemia    Hyperthyroidism 01/07/2016   Dr Dwyane Dee, positive TSHR Ab   Osteopenia    PONV (postoperative nausea and vomiting)    post-op nausea   Sinus drainage     Past Surgical History:  Procedure Laterality Date   BREAST BIOPSY Right 2010   benign    BREAST EXCISIONAL BIOPSY Right 2010   benign   CARPAL TUNNEL RELEASE Right 04/25/2013   CARPAL TUNNEL RELEASE;  Surgeon: Tennis Must, MD;  Location: [place] RELEASE Left 09/24/2020   Procedure: LEFT CARPAL TUNNEL RELEASE;  Surgeon: Leanora Cover, MD;  Location: [place];  Service: Orthopedics;  Laterality: Left;  30 MINUTES   CATARACT EXTRACTION Bilateral 2013   COLONOSCOPY  2002   rectosigmoid bulge thought 2/2 fibroids   COLONOSCOPY  2005   polyps, diverticulosis, rpt 7-10 yrs ([facility])   COLONOSCOPY  08/2015   HP, mild diverticulosis, rpt 5 yrs  ([facility])   DENTAL SURGERY     dental implants   ESOPHAGOGASTRODUODENOSCOPY  2005   antral gastropathy, neg H pylori screen ([facility])   TONSILLECTOMY  1976   TOTAL ABDOMINAL HYSTERECTOMY  W/ BILATERAL SALPINGOOPHORECTOMY  2008   h/o abnormal pap smears    Prior to Admission medications   Medication Sig Start Date End Date Taking? Authorizing Provider  Calcium Carb-Cholecalciferol 600-800 MG-UNIT TABS Take 1 tablet by mouth daily.   Yes [provider]  Cholecalciferol (VITAMIN D3) 50 MCG (2000 UT) capsule Take 1 capsule by mouth daily. 05/14/17  Yes [provider]  ELDERBERRY PO Take by mouth daily at 12 noon.   Yes [provider]  levothyroxine (SYNTHROID) 75 MCG tablet TAKE 1 TABLET BY MOUTH ONCE DAILY BEFORE BREAKFAST (SKIP  DOSE  ONCE  A  WEEK) 07/23/21  Yes [name], MD  Multiple Vitamin (MULTIVITAMIN) tablet Take 1 tablet by mouth daily.   Yes [provider]  Turmeric 500 MG CAPS Take by mouth.   Yes [provider]    Current Outpatient Medications  Medication Sig Dispense Refill   Calcium Carb-Cholecalciferol 600-800 MG-UNIT TABS Take 1 tablet by mouth daily.     Cholecalciferol (VITAMIN D3) 50 MCG (2000 UT) capsule Take 1 capsule by mouth daily.     ELDERBERRY PO Take by mouth daily at 12 noon.     levothyroxine (SYNTHROID) 75 MCG tablet TAKE 1 TABLET BY MOUTH ONCE DAILY BEFORE BREAKFAST (SKIP  DOSE  ONCE  A  WEEK) 90 tablet 1   Multiple Vitamin (MULTIVITAMIN) tablet Take 1 tablet by mouth daily.  Turmeric 500 MG CAPS Take by mouth.     Current Facility-Administered Medications  Medication Dose Route Frequency Provider Last Rate Last Admin   0.9 %  sodium chloride infusion  500 mL Intravenous Continuous Janyla Biscoe, Lajuan Lines, MD        Allergies as of 08/30/2021   (No Known Allergies)    Family History  Problem Relation Age of Onset   Diabetes Mother        double amputee   Congestive Heart Failure Mother    Diabetes Father    Stroke Sister    Lupus Sister        X 2   Breast cancer Sister 43   Colon polyps Brother    Diabetes Brother    Cancer Brother        duodenal, prostate    Stroke Maternal  Grandmother    Stroke Maternal Grandfather    Colon cancer Neg Hx    Thyroid disease Neg Hx    Rectal cancer Neg Hx    Stomach cancer Neg Hx    Esophageal cancer Neg Hx     Social History   Socioeconomic History   Marital status: Married    Spouse name: Not on file   Number of children: Not on file   Years of education: Not on file   Highest education level: Not on file  Occupational History   Not on file  Tobacco Use   Smoking status: Never   Smokeless tobacco: Never  Vaping Use   Vaping Use: Never used  Substance and Sexual Activity   Alcohol use: No    Alcohol/week: 0.0 standard drinks   Drug use: No   Sexual activity: Yes    Partners: Male    Birth control/protection: Surgical  Other Topics Concern   Not on file  Social History Narrative   Caffeine: non   Lives alone, no pets   Occupation: retired, was in Armed forces operational officer   Edu: Brewing technologist   Activity: goes to gym 3-4x/wk   Diet: some water, fruits and vegetables daily   Right handed    Social Determinants of Radio broadcast assistant Strain: Not on file  Food Insecurity: Not on file  Transportation Needs: Not on file  Physical Activity: Not on file  Stress: Not on file  Social Connections: Not on file  Intimate Partner Violence: Not on file    Physical Exam: Vital signs in last 24 hours: @BP  135/61    Pulse 66    Temp 98.9 F (37.2 C)    Ht 4' 11.5" (1.511 m)    Wt 151 lb (68.5 kg)    SpO2 98%    BMI 29.99 kg/m  GEN: NAD EYE: Sclerae anicteric ENT: MMM CV: Non-tachycardic Pulm: CTA b/l GI: Soft, NT/ND NEURO:  Alert & Oriented x 3   Kelsey Jarred, MD [facility]  08/30/2021 9:04 AM

## 2021-09-04 ENCOUNTER — Telehealth: Payer: Self-pay

## 2021-09-04 NOTE — Telephone Encounter (Signed)
°  Follow up Call-  Call back number 08/30/2021  Post procedure Call Back phone  # [phone]  Permission to leave phone message Yes  Some recent data might be hidden     Patient questions:  Do you have a fever, pain , or abdominal swelling? No. Pain Score  0 *  Have you tolerated food without any problems? Yes.    Have you been able to return to your normal activities? Yes.    Do you have any questions about your discharge instructions: Diet   No. Medications  No. Follow up visit  No.  Do you have questions or concerns about your Care? No.  Actions: * If pain score is 4 or above: No action needed, pain <4.  Have you developed a fever since your procedure? no  2.   Have you had an respiratory symptoms (SOB or cough) since your procedure? no  3.   Have you tested positive for COVID 19 since your procedure no  4.   Have you had any family members/close contacts diagnosed with the COVID 19 since your procedure?  no   If yes to any of these questions please route to Joylene John, RN and Joella Prince, RN

## 2021-11-22 ENCOUNTER — Telehealth: Payer: Self-pay

## 2021-11-22 MED ORDER — GABAPENTIN 300 MG PO CAPS: 300.0000 mg | ORAL_CAPSULE | Freq: Three times a day (TID) | ORAL | 3 refills | Status: DC | PRN

## 2021-11-22 NOTE — Telephone Encounter (Signed)
Pt's neurologist had written a gabapentin rx awhile back. She says she only takes it as needed. She no longer sees the neurologist for her neuropathy in her feet. Asking if Dr Danise Mina would write a rx for Gabapentin '300mg'$  3 times daily as needed. She uses [facility] ?

## 2021-11-22 NOTE — Telephone Encounter (Signed)
ERx 

## 2021-11-25 NOTE — Telephone Encounter (Signed)
Patient notified by telephone that script has been sent in. Patient stated that she has already picked it up. ?

## 2021-12-19 IMAGING — MG DIGITAL SCREENING BILAT W/ TOMO W/ CAD
8 series · 8 of 24 positions shown · non-contrast
Comparison: Previous exam(s).

CLINICAL DATA: Screening. History of benign RIGHT excisional
biopsy.

EXAM:
DIGITAL SCREENING BILATERAL MAMMOGRAM WITH TOMO AND CAD

[L CC synth-2D]
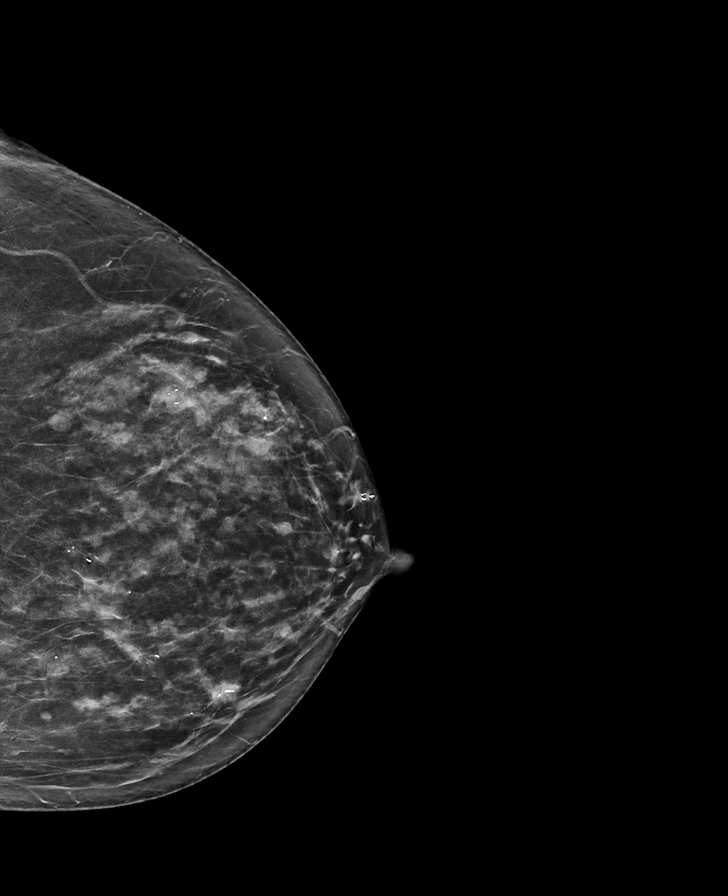

[R MLO synth-2D]
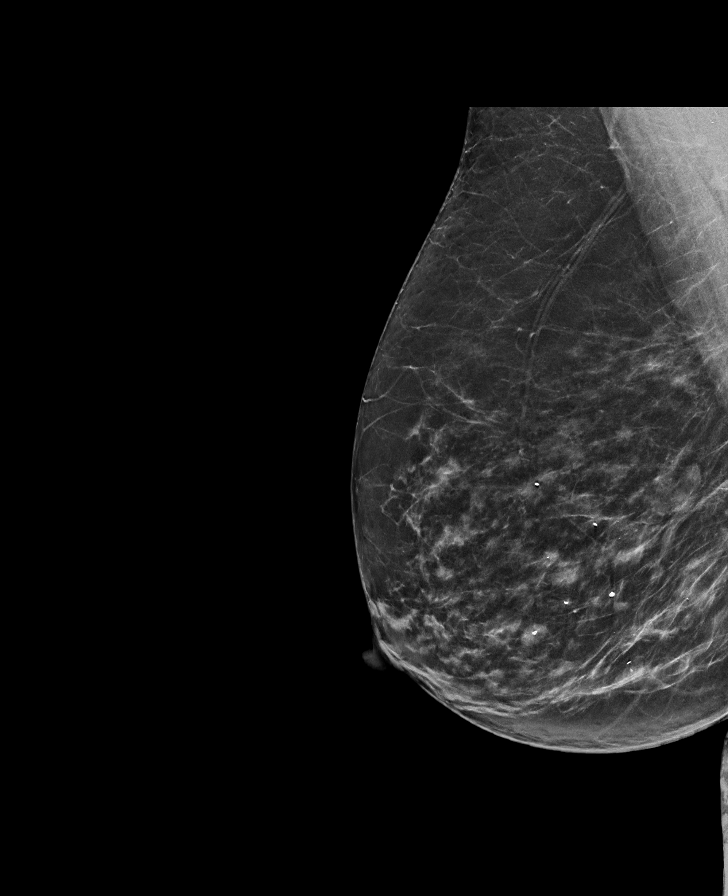

[R CC synth-2D]
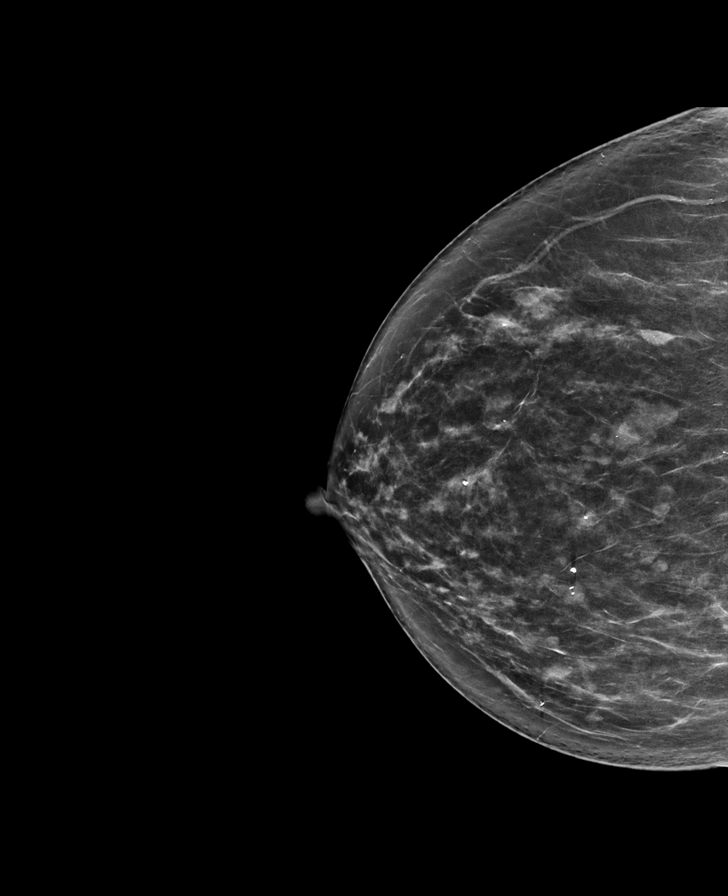

[L MLO synth-2D]
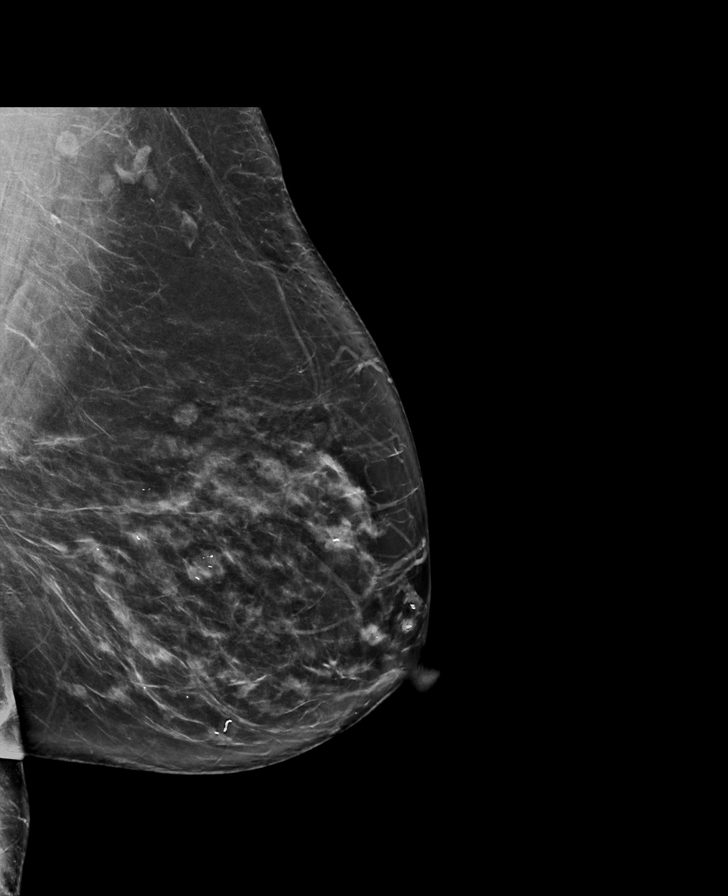

[R CC tomo · tomo slice 37/73.0]
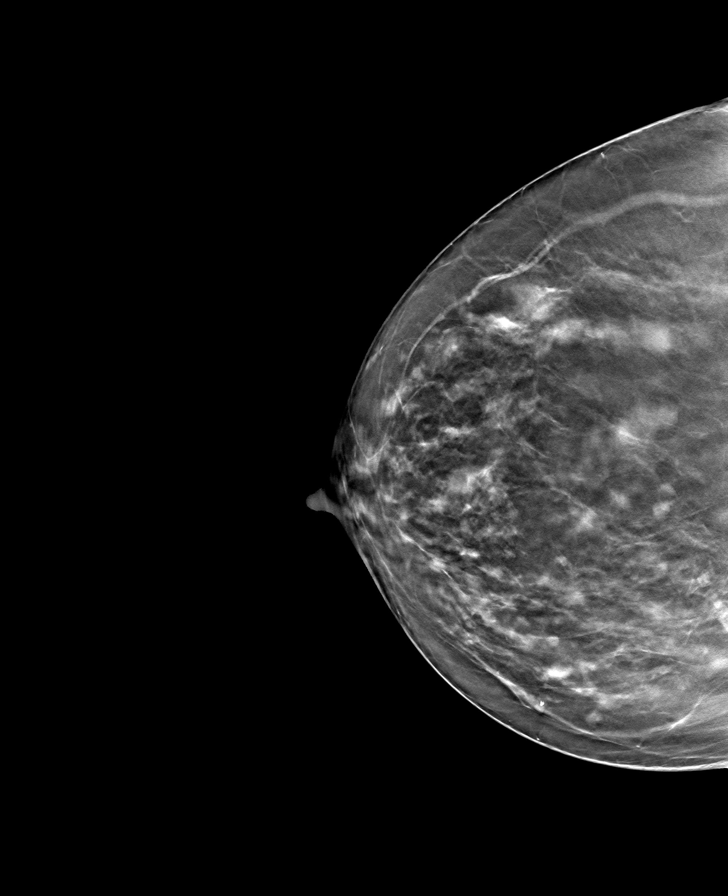

[R MLO tomo · tomo slice 38/75.0]
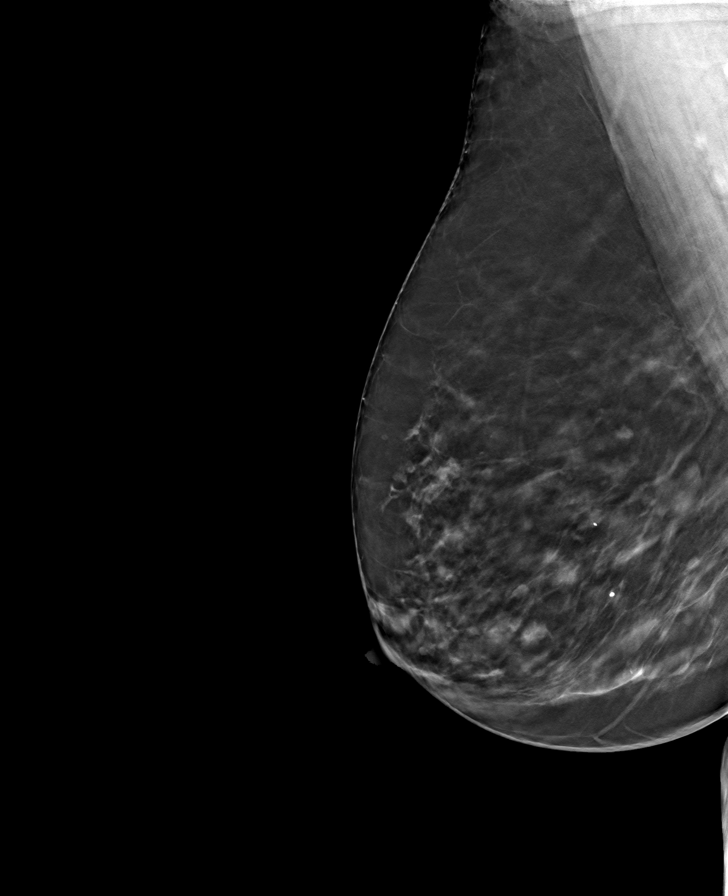

[L MLO tomo · tomo slice 39/77.0]
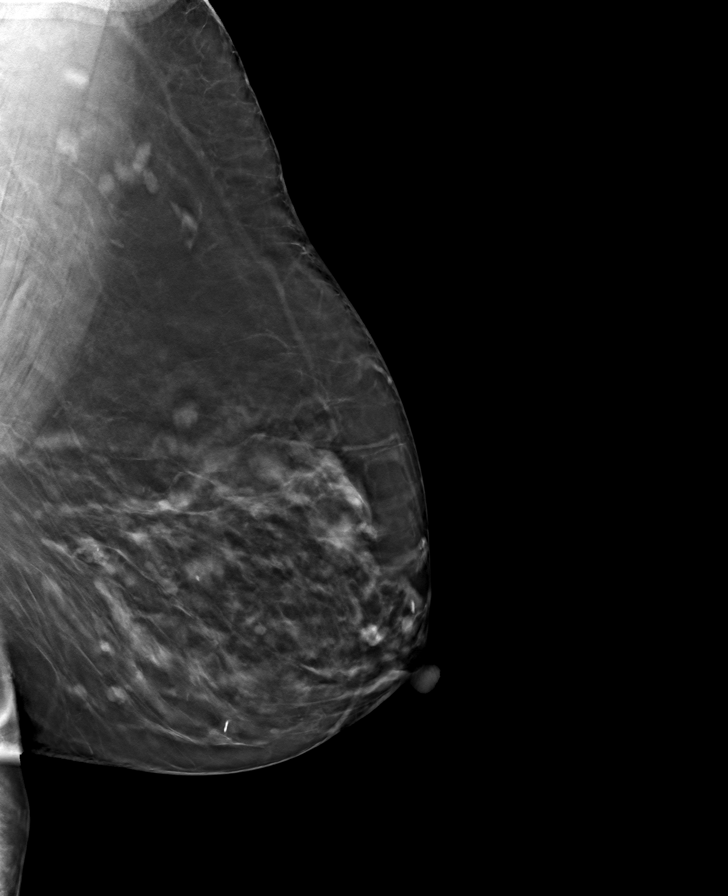

[L CC tomo · tomo slice 34/67.0]
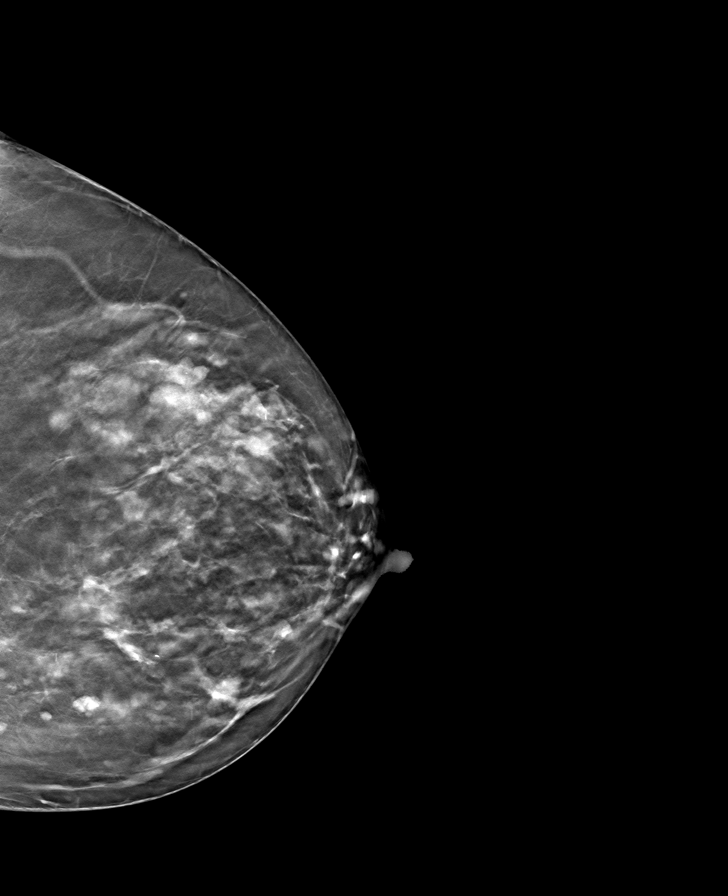

[8 of 24 positions shown; findings below may reference images not displayed]

ACR Breast Density Category c: The breast tissue is heterogeneously
dense, which may obscure small masses.
FINDINGS: There are no findings suspicious for malignancy. There are multiple
bilateral round and oval circumscribed masses, most consistent with
benign fibroadenomas or cysts. Images were processed with CAD.
IMPRESSION: No mammographic evidence of malignancy. A result letter of this
screening mammogram will be mailed directly to the patient.

RECOMMENDATION:
Screening mammogram in one year. (Code:CG-T-55B)

BI-RADS CATEGORY  2: Benign.

## 2021-12-30 ENCOUNTER — Other Ambulatory Visit (INDEPENDENT_AMBULATORY_CARE_PROVIDER_SITE_OTHER): Payer: Federal, State, Local not specified - PPO

## 2021-12-30 DIAGNOSIS — E890 Postprocedural hypothyroidism: Secondary | ICD-10-CM

## 2021-12-30 DIAGNOSIS — E7800 Pure hypercholesterolemia, unspecified: Secondary | ICD-10-CM

## 2021-12-30 LAB — LIPID PANEL
Cholesterol: 215 mg/dL — ABNORMAL HIGH (ref 0–200)
HDL: 43.8 mg/dL (ref >39.00)
LDL Cholesterol: 148 mg/dL — ABNORMAL HIGH (ref 0–99)
NonHDL: 170.99
Total CHOL/HDL Ratio: 5
Triglycerides: 116 mg/dL (ref 0.0–149.0)
VLDL: 23.2 mg/dL (ref 0.0–40.0)

## 2021-12-30 LAB — TSH: TSH: 11.56 u[IU]/mL — ABNORMAL HIGH (ref 0.35–5.50)

## 2021-12-30 LAB — T4, FREE: Free T4: 0.81 ng/dL (ref 0.60–1.60)

## 2021-12-31 NOTE — Progress Notes (Unsigned)
Patient ID: Kelsey Yoder, female   DOB: 17-May-1946, 76 y.o.   MRN: [id number]                                                                                                               Reason for Appointment:  Post radioactive iodine hypothyroidism, follow-up    Referring physician: Danise Yoder    History of Present Illness:   Prior to the consultation she had a history for the prior 2-3 months  of palpitations with her heart racing at times especially at night She was also having ankle swelling but no other typical symptoms of hyperthyroidism including weight loss.  Her labs showed that she had a positive result for thyrotropin receptor antibody measuring 13.5  Since she had continued need for antithyroid drugs she was given I-131 treatment on 10/24/16 with 12 mCi, her uptake was 82% Her methimazole was stopped on her visit on 11/14/16 when her free T4 was 0.71  In 04/2017 she was found to be hypothyroid with free T4 0.53 and was started on levothyroxine 25 g daily  RECENT history:  She has been on levothyroxine supplementation since 04/2017  She usually does not complain of symptoms of unusual fatigue when her TSH is high  Previously had needed periodic adjustment of her levothyroxine doses  Her LEVOTHYROXINE in 2020 was changed, taking 75 mcg with skipping the tablet on Saturdays since then She was last seen about a year ago  She says she feels good with no fatigue and no palpitations  She is consistent with taking the levothyroxine consistently before breakfast although she felt a little tired with some palpitations when she could not get it from the pharmacy in December She takes her multivitamins and calcium about 6 hours later  TSH previously was 6.1 when she ran out of her medication for 3 days but now it is back to 3.1 She has not missed any doses lately   Wt Readings from Last 3 Encounters:  08/30/21 151 lb (68.5 kg)  08/06/21 151 lb (68.5 kg)  07/12/21  152 lb 2 oz (69 kg)    Thyroid levels from recent labs are:   Lab Results  Component Value Date   TSH 11.56 (H) 12/30/2021   TSH 4.45 07/04/2021   TSH 3.14 12/28/2020   FREET4 0.81 12/30/2021   FREET4 0.95 07/04/2021   FREET4 1.11 12/28/2020          Allergies as of 01/01/2022   No Known Allergies      Medication List        Accurate as of December 31, 2021  9:04 PM. If you have any questions, ask your nurse or doctor.          Calcium Carb-Cholecalciferol 600-800 MG-UNIT Tabs Take 1 tablet by mouth daily.   ELDERBERRY PO Take by mouth daily at 12 noon.   gabapentin 300 MG capsule Commonly known as: NEURONTIN Take 1 capsule (300 mg total) by mouth 3 (three) times daily as needed (neuropathy pain).   levothyroxine  75 MCG tablet Commonly known as: SYNTHROID TAKE 1 TABLET BY MOUTH ONCE DAILY BEFORE BREAKFAST (SKIP  DOSE  ONCE  A  WEEK)   multivitamin tablet Take 1 tablet by mouth daily.   Turmeric 500 MG Caps Take by mouth.   Vitamin D3 50 MCG (2000 UT) capsule Take 1 capsule by mouth daily.            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   Cataract    Hyperlipidemia    Hyperthyroidism 01/07/2016   Dr Kelsey Yoder, positive TSHR Ab   Osteopenia    PONV (postoperative nausea and vomiting)    post-op nausea   Sinus drainage     Past Surgical History:  Procedure Laterality Date   BREAST BIOPSY Right 2010   benign    BREAST EXCISIONAL BIOPSY Right 2010   benign   CARPAL TUNNEL RELEASE Right 04/25/2013   CARPAL TUNNEL RELEASE;  Surgeon: Kelsey Must, MD;  Location: [facility] Left 09/24/2020   Procedure: LEFT CARPAL TUNNEL RELEASE;  Surgeon: Kelsey Cover, MD;  Location: [facility];  Service: Orthopedics;  Laterality: Left;  42 MINUTES   CATARACT EXTRACTION Bilateral 2013   COLONOSCOPY  2002    rectosigmoid bulge thought 2/2 fibroids   COLONOSCOPY  2005   polyps, diverticulosis, rpt 7-10 yrs (Kelsey Yoder)   COLONOSCOPY  08/2015   HP, mild diverticulosis, rpt 5 yrs  (Kelsey Yoder)   DENTAL SURGERY     dental implants   ESOPHAGOGASTRODUODENOSCOPY  2005   antral gastropathy, neg H pylori screen (Kelsey Yoder)   TONSILLECTOMY  1976   TOTAL ABDOMINAL HYSTERECTOMY W/ BILATERAL SALPINGOOPHORECTOMY  2008   h/o abnormal pap smears    Family History  Problem Relation Age of Onset   Diabetes Mother        double amputee   Congestive Heart Failure Mother    Diabetes Father    Stroke Sister    Lupus Sister        X 2   Breast cancer Sister 79   Colon polyps Brother    Diabetes Brother    Cancer Brother        duodenal, prostate    Stroke Maternal Grandmother    Stroke Maternal Grandfather    Colon cancer Neg Hx    Thyroid disease Neg Hx    Rectal cancer Neg Hx    Stomach cancer Neg Hx    Esophageal cancer Neg Hx     Social History:  reports that she has never smoked. She has never used smokeless tobacco. She reports that she does not drink alcohol and does not use drugs.  Allergies: No Known Allergies    Review of Systems     Examination:   There were no vitals taken for this visit.  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

## 2022-01-01 ENCOUNTER — Ambulatory Visit: Payer: Federal, State, Local not specified - PPO | Admitting: Endocrinology

## 2022-01-01 ENCOUNTER — Encounter: Payer: Self-pay | Admitting: Endocrinology

## 2022-01-01 VITALS — BP 124/70 | HR 66 | Ht 59.5 in | Wt 152.4 lb

## 2022-01-01 DIAGNOSIS — E890 Postprocedural hypothyroidism: Secondary | ICD-10-CM

## 2022-01-01 MED ORDER — LEVOTHYROXINE SODIUM 88 MCG PO TABS: 88.0000 ug | ORAL_TABLET | Freq: Every day | ORAL | 3 refills | Status: DC

## 2022-01-05 ENCOUNTER — Other Ambulatory Visit: Payer: Self-pay | Admitting: Family Medicine

## 2022-01-05 DIAGNOSIS — E7800 Pure hypercholesterolemia, unspecified: Secondary | ICD-10-CM

## 2022-01-05 DIAGNOSIS — E559 Vitamin D deficiency, unspecified: Secondary | ICD-10-CM

## 2022-01-05 DIAGNOSIS — E890 Postprocedural hypothyroidism: Secondary | ICD-10-CM

## 2022-01-05 DIAGNOSIS — E0590 Thyrotoxicosis, unspecified without thyrotoxic crisis or storm: Secondary | ICD-10-CM

## 2022-01-08 ENCOUNTER — Other Ambulatory Visit: Payer: Federal, State, Local not specified - PPO

## 2022-02-12 ENCOUNTER — Ambulatory Visit
Admission: RE | Admit: 2022-02-12 | Discharge: 2022-02-12 | Disposition: A | Discharge status: Home / Self Care | Payer: Federal, State, Local not specified - PPO | Source: Ambulatory Visit | Attending: Family Medicine | Admitting: Family Medicine

## 2022-02-12 DIAGNOSIS — M85852 Other specified disorders of bone density and structure, left thigh: Secondary | ICD-10-CM

## 2022-02-12 DIAGNOSIS — Z1231 Encounter for screening mammogram for malignant neoplasm of breast: Secondary | ICD-10-CM

## 2022-03-31 ENCOUNTER — Other Ambulatory Visit (INDEPENDENT_AMBULATORY_CARE_PROVIDER_SITE_OTHER): Payer: Federal, State, Local not specified - PPO

## 2022-03-31 DIAGNOSIS — E890 Postprocedural hypothyroidism: Secondary | ICD-10-CM | POA: Diagnosis not present

## 2022-03-31 LAB — T4, FREE: Free T4: 1.34 ng/dL (ref 0.60–1.60)

## 2022-03-31 LAB — TSH: TSH: 1.3 u[IU]/mL (ref 0.35–5.50)

## 2022-04-03 ENCOUNTER — Ambulatory Visit: Payer: Federal, State, Local not specified - PPO | Admitting: Endocrinology

## 2022-04-07 ENCOUNTER — Ambulatory Visit: Payer: Federal, State, Local not specified - PPO | Admitting: Endocrinology

## 2022-04-07 ENCOUNTER — Encounter: Payer: Self-pay | Admitting: Endocrinology

## 2022-04-07 VITALS — BP 124/70 | HR 74 | Ht 60.0 in | Wt 155.4 lb

## 2022-04-07 DIAGNOSIS — E890 Postprocedural hypothyroidism: Secondary | ICD-10-CM | POA: Diagnosis not present

## 2022-04-07 NOTE — Progress Notes (Signed)
Patient ID: Kelsey Yoder, female   DOB: Apr 01, 1946, 76 y.o.   MRN: [id number]                                                                                                               Reason for Appointment:  Post radioactive iodine hypothyroidism, follow-up    Referring physician: Danise Mina    History of Present Illness:   Prior to the consultation she had a history for the prior 2-3 months  of palpitations with her heart racing at times especially at night She was also having ankle swelling but no other typical symptoms of hyperthyroidism including weight loss.  Her labs showed that she had a positive result for thyrotropin receptor antibody measuring 13.5  Since she had continued need for antithyroid drugs she was given I-131 treatment on 10/24/16 with 12 mCi, her uptake was 82% Her methimazole was stopped on her visit on 11/14/16 when her free T4 was 0.71  In 04/2017 she was found to be hypothyroid with free T4 0.53 and was started on levothyroxine 25 g daily  RECENT history:  She has been on levothyroxine supplementation since 04/2017  She usually does not complain of symptoms of unusual fatigue when her TSH is high  She has had some adjustment of her levothyroxine doses although until 6/23 was on a stable dose Despite taking her levothyroxine consistently her TSH was about 11.6 at that time and she was having symptoms of fatigue With increasing her dose to 88 mcg levothyroxine she feels less tired No recent heat or cold intolerance or skin or hair changes Weight is up slightly  She is consistent with taking the levothyroxine daily before breakfast  Has not missed any doses She takes her multivitamins and calcium about 6 hours later  TSH is back to normal  Wt Readings from Last 3 Encounters:  04/07/22 155 lb 6.4 oz (70.5 kg)  01/01/22 152 lb 6.4 oz (69.1 kg)  08/30/21 151 lb (68.5 kg)    Thyroid levels from recent labs are:   Lab Results  Component  Value Date   TSH 1.30 03/31/2022   TSH 11.56 (H) 12/30/2021   TSH 4.45 07/04/2021   FREET4 1.34 03/31/2022   FREET4 0.81 12/30/2021   FREET4 0.95 07/04/2021     Allergies as of 04/07/2022   No Known Allergies      Medication List        Accurate as of April 07, 2022 10:39 AM. If you have any questions, ask your nurse or doctor.          Calcium Carb-Cholecalciferol 600-800 MG-UNIT Tabs Take 1 tablet by mouth daily.   ELDERBERRY PO Take by mouth daily at 12 noon.   gabapentin 300 MG capsule Commonly known as: NEURONTIN Take 1 capsule (300 mg total) by mouth 3 (three) times daily as needed (neuropathy pain).   levothyroxine 88 MCG tablet Commonly known as: SYNTHROID Take 1 tablet (88 mcg total) by mouth daily.   multivitamin tablet Take 1 tablet by mouth  daily.   Turmeric 500 MG Caps Take by mouth.   Vitamin D3 50 MCG (2000 UT) capsule Take 1 capsule by mouth daily.            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   Cataract    Hyperlipidemia    Hyperthyroidism 01/07/2016   Dr Dwyane Dee, positive TSHR Ab   Osteopenia    PONV (postoperative nausea and vomiting)    post-op nausea   Sinus drainage     Past Surgical History:  Procedure Laterality Date   BREAST BIOPSY Right 2010   benign    BREAST EXCISIONAL BIOPSY Right 2010   benign   CARPAL TUNNEL RELEASE Right 04/25/2013   CARPAL TUNNEL RELEASE;  Surgeon: Tennis Must, MD;  Location: [place] RELEASE Left 09/24/2020   Procedure: LEFT CARPAL TUNNEL RELEASE;  Surgeon: Leanora Cover, MD;  Location: [place];  Service: Orthopedics;  Laterality: Left;  79 MINUTES   CATARACT EXTRACTION Bilateral 2013   COLONOSCOPY  2002   rectosigmoid bulge thought 2/2 fibroids   COLONOSCOPY  2005   polyps, diverticulosis, rpt 7-10 yrs ([facility])   COLONOSCOPY  08/2015    HP, mild diverticulosis, rpt 5 yrs  ([facility])   DENTAL SURGERY     dental implants   ESOPHAGOGASTRODUODENOSCOPY  2005   antral gastropathy, neg H pylori screen ([facility])   TONSILLECTOMY  1976   TOTAL ABDOMINAL HYSTERECTOMY W/ BILATERAL SALPINGOOPHORECTOMY  2008   h/o abnormal pap smears    Family History  Problem Relation Age of Onset   Diabetes Mother        double amputee   Congestive Heart Failure Mother    Diabetes Father    Stroke Sister    Lupus Sister        X 2   Breast cancer Sister 47   Colon polyps Brother    Diabetes Brother    Cancer Brother        duodenal, prostate    Stroke Maternal Grandmother    Stroke Maternal Grandfather    Colon cancer Neg Hx    Thyroid disease Neg Hx    Rectal cancer Neg Hx    Stomach cancer Neg Hx    Esophageal cancer Neg Hx     Social History:  reports that she has never smoked. She has never used smokeless tobacco. She reports that she does not drink alcohol and does not use drugs.  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

## 2022-06-01 LAB — GLUCOSE, POCT (MANUAL RESULT ENTRY): POC Glucose: 93 mg/dl (ref 70–99)

## 2022-06-23 ENCOUNTER — Encounter: Payer: Self-pay | Admitting: *Deleted

## 2022-06-23 NOTE — Progress Notes (Signed)
Pt last seen by PCP, Dr. Ria Bush, at [facility],  for Health maintenance OV on 07/04/21. Her b/p and Pulse and event POCT blood sugar were sent in an in-basket message to her PCP. Normal result letter sent to pt, who also has ongoing endocrinology care. No further health equity team support indicated at this time.

## 2022-06-27 ENCOUNTER — Encounter: Payer: Self-pay | Admitting: *Deleted

## 2022-06-27 NOTE — Progress Notes (Signed)
Ria Bush, MD  Aftyn Nott, Cooper Render, RN Thank you for the message! Hope you're well, -Garlon Hatchet       Previous Messages    ----- Message ----- From: Hulan Fray, RN Sent: 06/23/2022   8:38 AM EST To: Ria Bush, MD Subject: community event results                        Hello, Dr. Danise Mina, Your patient, Kelsey Yoder, participated on 05/31/22 in a [facility] sponsored community wellness event. Her [facility] records indicate you are her PCP, so we just wanted to share that her blood pressure was 134/80; her pulse was 93; and her POCT blood sugar was 93 at that event.  Her chart indicates that her last OV HM visit with you was 07/04/21. This info is also documented in her chart and we will send her a letter with her results, and to follow up with you for future healthcare needs. Many thanks, Yvonna Alanis, RN, Health Equity support team

## 2022-08-30 ENCOUNTER — Other Ambulatory Visit: Payer: Self-pay | Admitting: Family Medicine

## 2022-08-30 DIAGNOSIS — E890 Postprocedural hypothyroidism: Secondary | ICD-10-CM

## 2022-08-30 DIAGNOSIS — E559 Vitamin D deficiency, unspecified: Secondary | ICD-10-CM

## 2022-08-30 DIAGNOSIS — E7800 Pure hypercholesterolemia, unspecified: Secondary | ICD-10-CM

## 2022-09-02 ENCOUNTER — Other Ambulatory Visit (INDEPENDENT_AMBULATORY_CARE_PROVIDER_SITE_OTHER): Payer: Federal, State, Local not specified - PPO

## 2022-09-02 DIAGNOSIS — E7800 Pure hypercholesterolemia, unspecified: Secondary | ICD-10-CM

## 2022-09-02 DIAGNOSIS — E559 Vitamin D deficiency, unspecified: Secondary | ICD-10-CM | POA: Diagnosis not present

## 2022-09-02 LAB — COMPREHENSIVE METABOLIC PANEL
ALT: 13 U/L (ref 0–35)
AST: 18 U/L (ref 0–37)
Albumin: 3.7 g/dL (ref 3.5–5.2)
Alkaline Phosphatase: 102 U/L (ref 39–117)
BUN: 15 mg/dL (ref 6–23)
CO2: 27 mEq/L (ref 19–32)
Calcium: 8.7 mg/dL (ref 8.4–10.5)
Chloride: 107 mEq/L (ref 96–112)
Creatinine, Ser: 0.84 mg/dL (ref 0.40–1.20)
GFR: 68.45 mL/min (ref >60.00)
Glucose, Bld: 90 mg/dL (ref 70–99)
Potassium: 4.1 mEq/L (ref 3.5–5.1)
Sodium: 140 mEq/L (ref 135–145)
Total Bilirubin: 0.4 mg/dL (ref 0.2–1.2)
Total Protein: 6.9 g/dL (ref 6.0–8.3)

## 2022-09-02 LAB — LIPID PANEL
Cholesterol: 191 mg/dL (ref 0–200)
HDL: 44.8 mg/dL (ref >39.00)
LDL Cholesterol: 129 mg/dL — ABNORMAL HIGH (ref 0–99)
NonHDL: 145.81
Total CHOL/HDL Ratio: 4
Triglycerides: 86 mg/dL (ref 0.0–149.0)
VLDL: 17.2 mg/dL (ref 0.0–40.0)

## 2022-09-02 LAB — VITAMIN D 25 HYDROXY (VIT D DEFICIENCY, FRACTURES): VITD: 35.89 ng/mL (ref 30.00–100.00)

## 2022-09-09 ENCOUNTER — Ambulatory Visit (INDEPENDENT_AMBULATORY_CARE_PROVIDER_SITE_OTHER): Payer: Federal, State, Local not specified - PPO | Admitting: Family Medicine

## 2022-09-09 ENCOUNTER — Encounter: Payer: Self-pay | Admitting: Family Medicine

## 2022-09-09 VITALS — BP 124/68 | HR 70 | Temp 97.9°F | Ht 59.25 in | Wt 152.4 lb

## 2022-09-09 DIAGNOSIS — Z23 Encounter for immunization: Secondary | ICD-10-CM

## 2022-09-09 DIAGNOSIS — Z7189 Other specified counseling: Secondary | ICD-10-CM

## 2022-09-09 DIAGNOSIS — M7660 Achilles tendinitis, unspecified leg: Secondary | ICD-10-CM | POA: Insufficient documentation

## 2022-09-09 DIAGNOSIS — E890 Postprocedural hypothyroidism: Secondary | ICD-10-CM

## 2022-09-09 DIAGNOSIS — G5711 Meralgia paresthetica, right lower limb: Secondary | ICD-10-CM

## 2022-09-09 DIAGNOSIS — Z0000 Encounter for general adult medical examination without abnormal findings: Secondary | ICD-10-CM

## 2022-09-09 DIAGNOSIS — E7800 Pure hypercholesterolemia, unspecified: Secondary | ICD-10-CM | POA: Diagnosis not present

## 2022-09-09 DIAGNOSIS — E559 Vitamin D deficiency, unspecified: Secondary | ICD-10-CM

## 2022-09-09 DIAGNOSIS — M85851 Other specified disorders of bone density and structure, right thigh: Secondary | ICD-10-CM

## 2022-09-09 MED ORDER — GABAPENTIN 300 MG PO CAPS: 300.0000 mg | ORAL_CAPSULE | Freq: Two times a day (BID) | ORAL | 3 refills | Status: AC | PRN

## 2022-09-09 MED ORDER — VITAMIN B-12 2500 MCG SL SUBL: 0.5000 | SUBLINGUAL_TABLET | Freq: Every day | SUBLINGUAL | Status: DC

## 2022-09-09 NOTE — Assessment & Plan Note (Deleted)
Appreciate endo care.

## 2022-09-09 NOTE — Assessment & Plan Note (Signed)
Levels dropping - she's not been as regularly with vit D replacement but will restart.

## 2022-09-09 NOTE — Assessment & Plan Note (Signed)
Appreciate endo care.

## 2022-09-09 NOTE — Assessment & Plan Note (Signed)
Preventative protocols reviewed and updated unless pt declined. Discussed healthy diet and lifestyle.  

## 2022-09-09 NOTE — Assessment & Plan Note (Addendum)
Chronic, ongoing. Avoid tight clothes. Continue gabapentin PRN, predominantly uses nightly. She's recently restarted oral b12 replacement. Update labs next labwork.

## 2022-09-09 NOTE — Progress Notes (Signed)
Patient ID: Kelsey Yoder, female    DOB: 1946/01/30, 77 y.o.   MRN: [id number]  This visit was conducted in person.  BP 124/68   Pulse 70   Temp 97.9 F (36.6 C) (Temporal)   Ht 4' 11.25" (1.505 m)   Wt 152 lb 6 oz (69.1 kg)   SpO2 99%   BMI 30.52 kg/m    CC: CPE Subjective:   HPI: Kelsey Yoder is a 77 y.o. female presenting on 09/09/2022 for Annual Exam   Does not have medicare.  No results found.  [place] Office Visit from 09/09/2022 in [place] at [facility]  PHQ-2 Total Score 0          09/09/2022    9:57 AM  Fall Risk   Falls in the past year? 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 No thyroid symptoms.   Notes numbness to R lateral thigh worse at night time - takes gabapentin '300mg'$  BID PRN for this with good effect. Predominantly takes 1 at night.   Notes tender hard swelling to left posterior ankle above achilles tendon for years. This may have started when she tore ligament in that area remotely (20+ yrs ago).   Preventative: COLONOSCOPY 08/2015; HP, mild diverticulosis, rpt 5 yrs  ([facility])  Colonoscopy 08/2021 - diverticulosis, small int hem, no f/u recommended ([facility])  Well woman - OBGYN 08/2020 (Dr. Harolyn Rutherford). S/p total hysterectomy 2008 for fibroids and abnormal pap smears, ovaries removed as well. Did take HRT for about 10 yrs.  [facility]/2022 [facility] DEXA - 11/2016 - osteopenia, T -1.8 at hip.  DEXA 02/2022 - T -1.7 R fem neck, not at increased fracture risk  Regular with calcium and vit D and weight bearing exercise.  Lung cancer screening - not eligible  Flu shot today [place] 08/2019, 09/2019, booster 12/2020  Td 2000, 2010 Pneumovax 2014. Prevnar-13 05/2014  Zostavax 2012  Shingrix - discussed, declines  Advanced directive: living will in chart 06/2014 - no prolonged life support if terminal  condition. Would want HCPOA to be sister [name]. Since then married ,may want husband to be HCPOA - will look at forms to update.  Seat belt use discussed Sunscreen use discussed. 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 Interim medical history since our last visit reviewed. Allergies and medications reviewed and updated. Outpatient Medications Prior to Visit  Medication Sig Dispense Refill   Calcium Carb-Cholecalciferol 600-800 MG-UNIT TABS Take 1 tablet by mouth daily.     Cholecalciferol (VITAMIN D3) 50 MCG (2000 UT) capsule Take 1 capsule by mouth daily.     ELDERBERRY PO Take by mouth daily at 12 noon.     levothyroxine (SYNTHROID) 88 MCG tablet Take 1 tablet (88 mcg total) by mouth daily. 90 tablet 3   Multiple Vitamin (MULTIVITAMIN) tablet Take 1 tablet by mouth  daily.     Turmeric 500 MG CAPS Take by mouth.     Cyanocobalamin (VITAMIN B-12) 2500 MCG SUBL Place 1 tablet under the tongue daily. Takes 1/2 tablet daily     gabapentin (NEURONTIN) 300 MG capsule Take 1 capsule (300 mg total) by mouth 3 (three) times daily as needed (neuropathy pain). 90 capsule 3   No facility-administered medications prior to visit.     Per HPI unless specifically indicated in ROS section below Review of Systems  Constitutional:  Negative for activity change, appetite change, chills, fatigue, fever and unexpected weight change.  HENT:  Negative for hearing loss.   Eyes:  Negative for visual disturbance.   Respiratory:  Negative for cough, chest tightness, shortness of breath and wheezing.   Cardiovascular:  Negative for chest pain, palpitations and leg swelling.  Gastrointestinal:  Negative for abdominal distention, abdominal pain, blood in stool, constipation, diarrhea, nausea and vomiting.  Genitourinary:  Negative for difficulty urinating and hematuria.  Musculoskeletal:  Negative for arthralgias, myalgias and neck pain.  Skin:  Negative for rash.  Neurological:  Negative for dizziness, seizures, syncope and headaches.  Hematological:  Negative for adenopathy. Does not bruise/bleed easily.  Psychiatric/Behavioral:  Negative for dysphoric mood. The patient is not nervous/anxious.     Objective:  BP 124/68   Pulse 70   Temp 97.9 F (36.6 C) (Temporal)   Ht 4' 11.25" (1.505 m)   Wt 152 lb 6 oz (69.1 kg)   SpO2 99%   BMI 30.52 kg/m   Wt Readings from Last 3 Encounters:  09/09/22 152 lb 6 oz (69.1 kg)  04/07/22 155 lb 6.4 oz (70.5 kg)  01/01/22 152 lb 6.4 oz (69.1 kg)      Physical Exam Vitals and nursing note reviewed.  Constitutional:      Appearance: Normal appearance. She is not ill-appearing.  HENT:     Head: Normocephalic and atraumatic.     Right Ear: Tympanic membrane, ear canal and external ear normal. There is no impacted cerumen.     Left Ear: Tympanic membrane, ear canal and external ear normal. There is no impacted cerumen.     Nose: Nose normal.     Mouth/Throat:     Mouth: Mucous membranes are moist.     Pharynx: Oropharynx is clear. No oropharyngeal exudate or posterior oropharyngeal erythema.  Eyes:     General:        Right eye: No discharge.        Left eye: No discharge.     Extraocular Movements: Extraocular movements intact.     Conjunctiva/sclera: Conjunctivae normal.     Pupils: Pupils are equal, round, and reactive to light.  Neck:     Thyroid: No thyroid mass or thyromegaly.     Vascular: No carotid bruit.  Cardiovascular:     Rate and  Rhythm: Normal rate and regular rhythm.     Pulses: Normal pulses.     Heart sounds: Normal heart sounds. No murmur heard. Pulmonary:     Effort: Pulmonary effort is normal. No respiratory distress.     Breath sounds: Normal breath sounds. No wheezing, rhonchi or rales.  Abdominal:     General: Bowel sounds are normal. There is no distension.     Palpations: Abdomen is soft. There is no mass.     Tenderness: There is no abdominal tenderness. There is no guarding or rebound.     Hernia: No hernia is present.  Musculoskeletal:  Cervical back: Normal range of motion and neck supple. No rigidity.     Right lower leg: No edema.     Left lower leg: No edema.  Lymphadenopathy:     Cervical: No cervical adenopathy.  Skin:    General: Skin is warm and dry.     Findings: No rash.  Neurological:     General: No focal deficit present.     Mental Status: She is alert. Mental status is at baseline.  Psychiatric:        Mood and Affect: Mood normal.        Behavior: Behavior normal.       Results for orders placed or performed in visit on 09/02/22  VITAMIN D 25 Hydroxy (Vit-D Deficiency, Fractures)  Result Value Ref Range   VITD 35.89 30.00 - 100.00 ng/mL  Comprehensive metabolic panel  Result Value Ref Range   Sodium 140 135 - 145 mEq/L   Potassium 4.1 3.5 - 5.1 mEq/L   Chloride 107 96 - 112 mEq/L   CO2 27 19 - 32 mEq/L   Glucose, Bld 90 70 - 99 mg/dL   BUN 15 6 - 23 mg/dL   Creatinine, Ser 0.84 0.40 - 1.20 mg/dL   Total Bilirubin 0.4 0.2 - 1.2 mg/dL   Alkaline Phosphatase 102 39 - 117 U/L   AST 18 0 - 37 U/L   ALT 13 0 - 35 U/L   Total Protein 6.9 6.0 - 8.3 g/dL   Albumin 3.7 3.5 - 5.2 g/dL   GFR 68.45 >60.00 mL/min   Calcium 8.7 8.4 - 10.5 mg/dL  Lipid panel  Result Value Ref Range   Cholesterol 191 0 - 200 mg/dL   Triglycerides 86.0 0.0 - 149.0 mg/dL   HDL 44.80 >39.00 mg/dL   VLDL 17.2 0.0 - 40.0 mg/dL   LDL Cholesterol 129 (H) 0 - 99 mg/dL   Total CHOL/HDL Ratio 4     NonHDL 145.81     Assessment & Plan:   Problem List Items Addressed This Visit     Advanced care planning/counseling discussion (Chronic)    Advanced directive: living will in chart 06/2014 - no prolonged life support if terminal condition. Would want HCPOA to be sister [name]. Since then married ,may want husband to be HCPOA - will look at forms to update.       Health maintenance examination - Primary (Chronic)    Preventative protocols reviewed and updated unless pt declined. Discussed healthy diet and lifestyle.       Hyperlipidemia    Chronic off meds, significant improvement noted. Fmhx CVA, has previously declined any statin.  The 10-year ASCVD risk score (Arnett DK, et al., 2019) is: 13.3%   Values used to calculate the score:     Age: 29 years     Sex: Female     Is Non-Hispanic African American: Yes     Diabetic: No     Tobacco smoker: No     Systolic Blood Pressure: A999333 mmHg     Is BP treated: No     HDL Cholesterol: 44.8 mg/dL     Total Cholesterol: 191 mg/dL       Osteopenia    Stable period on latest DEXA - encouraged continued calcium, vit D, regular weight bearing exercise.      Vitamin D deficiency    Levels dropping - she's not been as regularly with vit D replacement but will restart.       Postablative hypothyroidism  Appreciate endo care.       Meralgia paresthetica of right side    Chronic, ongoing. Avoid tight clothes. Continue gabapentin PRN, predominantly uses nightly. She's recently restarted oral b12 replacement. Update labs next labwork.       Relevant Medications   gabapentin (NEURONTIN) 300 MG capsule   Pain in Achilles tendon    Chronic issue with left achilles tendon thickening/swelling, now with new discomfort suspect retrocalcaneal bursitis vs chronic achilles tendinopathy.  Rec small amount NSAID, heel lift inserts, regular voltaren gel and heating pad.  If not improved, will recommend f/u with sports medicine Dr  Lorelei Pont.       Other Visit Diagnoses     Need for influenza vaccination       Relevant Orders   Flu Vaccine QUAD High Dose(Fluad) (Completed)        Meds ordered this encounter  Medications   gabapentin (NEURONTIN) 300 MG capsule    Sig: Take 1 capsule (300 mg total) by mouth 2 (two) times daily as needed (neuropathy pain).    Dispense:  90 capsule    Refill:  3   Cyanocobalamin (VITAMIN B-12) 2500 MCG SUBL    Sig: Place 0.5 tablets (1,250 mcg total) under the tongue daily.    Orders Placed This Encounter  Procedures   Flu Vaccine QUAD High Dose(Fluad)    Patient Instructions  Flu shot today  Consider shingrix shot series.  Sounds like component of meralgia paresthetica to right lateral thigh - avoid tight fitting clothes. Let us know if new or worsening symptoms.  For left heel/achilles tendon - possible bursitis. May try ibuprofen '200mg'$  1-2 tablets twice daily with meals for 5 days, use topical voltaren gel to tender area up to 3 times a day as well. Use heating pad to area. Let us know if worsening despite above.  You are doing well today Return as needed or in 1 year for next physical.   Follow up plan: Return in about 1 year (around 09/10/2023) for annual exam, prior fasting for blood work.  Ria Bush, MD

## 2022-09-09 NOTE — Assessment & Plan Note (Addendum)
Chronic off meds, significant improvement noted. Fmhx CVA, has previously declined any statin.  The 10-year ASCVD risk score (Arnett DK, et al., 2019) is: 13.3%   Values used to calculate the score:     Age: 77 years     Sex: Female     Is Non-Hispanic African American: Yes     Diabetic: No     Tobacco smoker: No     Systolic Blood Pressure: A999333 mmHg     Is BP treated: No     HDL Cholesterol: 44.8 mg/dL     Total Cholesterol: 191 mg/dL

## 2022-09-09 NOTE — Patient Instructions (Addendum)
Flu shot today  Consider shingrix shot series.  Sounds like component of meralgia paresthetica to right lateral thigh - avoid tight fitting clothes. Let us know if new or worsening symptoms.  For left heel/achilles tendon - possible bursitis. May try ibuprofen '200mg'$  1-2 tablets twice daily with meals for 5 days, use topical voltaren gel to tender area up to 3 times a day as well. Use heating pad to area. Let us know if worsening despite above.  You are doing well today Return as needed or in 1 year for next physical.

## 2022-09-09 NOTE — Assessment & Plan Note (Signed)
Chronic issue with left achilles tendon thickening/swelling, now with new discomfort suspect retrocalcaneal bursitis vs chronic achilles tendinopathy.  Rec small amount NSAID, heel lift inserts, regular voltaren gel and heating pad.  If not improved, will recommend f/u with sports medicine Dr Lorelei Pont.

## 2022-09-09 NOTE — Assessment & Plan Note (Addendum)
Stable period on latest DEXA - encouraged continued calcium, vit D, regular weight bearing exercise.

## 2022-09-09 NOTE — Assessment & Plan Note (Signed)
Advanced directive: living will in chart 06/2014 - no prolonged life support if terminal condition. Would want HCPOA to be sister [name]. Since then married ,may want husband to be HCPOA - will look at forms to update.

## 2022-09-17 NOTE — Progress Notes (Unsigned)
    Kelsey Yoder. Kelsey Jaquith, MD, [place] at [facility] [place], [address]  Phone: [phone]  FAX: [phone]  Kelsey Yoder - 77 y.o. female  MRN [id number]  Date of Birth: 09-Jan-1946  Date: 09/18/2022  PCP: Ria Bush, MD  Referral: Ria Bush, MD  No chief complaint on file.  Subjective:   Kelsey Yoder is a 77 y.o. very pleasant female patient with There is no height or weight on file to calculate BMI. who presents with the following:  Pleasant patient who I remember from years in the past presents with some thigh pain.    Review of Systems is noted in the HPI, as appropriate  Objective:   There were no vitals taken for this visit.  GEN: No acute distress; alert,appropriate. PULM: Breathing comfortably in no respiratory distress PSYCH: Normally interactive.   Laboratory and Imaging Data:  Assessment and Plan:   ***

## 2022-09-18 ENCOUNTER — Ambulatory Visit: Payer: Federal, State, Local not specified - PPO | Admitting: Family Medicine

## 2022-09-18 ENCOUNTER — Encounter: Payer: Self-pay | Admitting: Family Medicine

## 2022-09-18 VITALS — BP 120/62 | HR 77 | Temp 98.2°F | Ht 59.25 in | Wt 152.4 lb

## 2022-09-18 DIAGNOSIS — S76312A Strain of muscle, fascia and tendon of the posterior muscle group at thigh level, left thigh, initial encounter: Secondary | ICD-10-CM | POA: Diagnosis not present

## 2022-09-18 MED ORDER — PREDNISONE 20 MG PO TABS: ORAL_TABLET | ORAL | 0 refills | Status: DC

## 2022-10-07 ENCOUNTER — Other Ambulatory Visit (INDEPENDENT_AMBULATORY_CARE_PROVIDER_SITE_OTHER): Payer: Federal, State, Local not specified - PPO

## 2022-10-07 DIAGNOSIS — E890 Postprocedural hypothyroidism: Secondary | ICD-10-CM

## 2022-10-07 LAB — T4, FREE: Free T4: 1.2 ng/dL (ref 0.60–1.60)

## 2022-10-07 LAB — TSH: TSH: 0.45 u[IU]/mL (ref 0.35–5.50)

## 2022-10-09 ENCOUNTER — Encounter: Payer: Self-pay | Admitting: Endocrinology

## 2022-10-09 ENCOUNTER — Ambulatory Visit: Payer: Federal, State, Local not specified - PPO | Admitting: Endocrinology

## 2022-10-09 VITALS — BP 128/74 | HR 97 | Ht 59.0 in | Wt 156.0 lb

## 2022-10-09 DIAGNOSIS — E890 Postprocedural hypothyroidism: Secondary | ICD-10-CM | POA: Diagnosis not present

## 2022-10-09 NOTE — Progress Notes (Signed)
Patient ID: Kelsey Yoder, female   DOB: Nov 13, 1945, 77 y.o.   MRN: [id number]    Reason for Appointment:  Post radioactive iodine hypothyroidism, follow-up    History of Present Illness:   Prior to the consultation she had a history for the prior 2-3 months  of palpitations with her heart racing at times especially at night She was also having ankle swelling but no other typical symptoms of hyperthyroidism including weight loss.  Her labs showed that she had a positive result for thyrotropin receptor antibody measuring 13.5  Since she had continued need for antithyroid drugs she was given I-131 treatment on 10/24/16 with 12 mCi, her uptake was 82% Her methimazole was stopped on her visit on 11/14/16 when her free T4 was 0.71  In 04/2017 she was found to be hypothyroid with free T4 0.53 and was started on levothyroxine 25 g daily  RECENT history:  She has been on levothyroxine supplementation since 04/2017  She usually does not complain of symptoms of unusual fatigue when her TSH is high  She has had some adjustment of her levothyroxine doses with the last increase in 6/23, prior to that was on a stable dose  TSH was about 11.6 at that time and she was having symptoms of fatigue With increasing her dose to 88 mcg levothyroxine she felt less tired  No complaints of unusual fatigue lately and she feels fairly good, no hair loss Also no complaints of anxiety or palpitations Weight is about the same  She is regular with taking the levothyroxine daily before breakfast  Has not missed any doses She takes her multivitamins and calcium about 6 hours later  TSH is again normal  Wt Readings from Last 3 Encounters:  10/09/22 156 lb (70.8 kg)  09/18/22 152 lb 6 oz (69.1 kg)  09/09/22 152 lb 6 oz (69.1 kg)    Thyroid levels from recent labs are:   Lab Results  Component Value Date   TSH  0.45 10/07/2022   TSH 1.30 03/31/2022   TSH 11.56 (H) 12/30/2021   FREET4 1.20 10/07/2022   FREET4 1.34 03/31/2022   FREET4 0.81 12/30/2021     Allergies as of 10/09/2022   No Known Allergies      Medication List        Accurate as of October 09, 2022 10:18 AM. If you have any questions, ask your nurse or doctor.          Calcium Carb-Cholecalciferol 600-800 MG-UNIT Tabs Take 1 tablet by mouth daily.   ELDERBERRY PO Take by mouth daily at 12 noon.   gabapentin 300 MG capsule Commonly known as: NEURONTIN Take 1 capsule (300 mg total) by mouth 2 (two) times daily as needed (neuropathy pain).   levothyroxine 88 MCG tablet Commonly known as: SYNTHROID Take 1 tablet (88 mcg total) by mouth daily.   multivitamin tablet Take 1 tablet by mouth daily.   predniSONE 20 MG tablet Commonly known as: DELTASONE 2 tabs po daily for 5 days, then  1 tab po daily for 5 days   Turmeric 500 MG Caps Take by mouth.   Vitamin B-12 2500 MCG Subl Place 0.5 tablets (1,250 mcg total) under the tongue daily.   Vitamin D3 50 MCG (2000 UT) Caps Generic drug: Cholecalciferol Take 1 capsule by mouth daily.            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   Cataract    Hyperlipidemia    Hyperthyroidism 01/07/2016   Dr Dwyane Dee, positive TSHR Ab   Osteopenia    PONV (postoperative nausea and vomiting)    post-op nausea   Sinus drainage     Past Surgical History:  Procedure Laterality Date   BREAST BIOPSY Right 2010   benign    BREAST EXCISIONAL BIOPSY Right 2010   benign   CARPAL TUNNEL RELEASE Right 04/25/2013   CARPAL TUNNEL RELEASE;  Surgeon: Tennis Must, MD;  Location: [place] RELEASE Left 09/24/2020   Procedure: LEFT CARPAL TUNNEL RELEASE;  Surgeon: Leanora Cover, MD;  Location: [place];  Service: Orthopedics;  Laterality:  Left;  30 MINUTES   CATARACT EXTRACTION Bilateral 2013   COLONOSCOPY  2002   rectosigmoid bulge thought 2/2 fibroids   COLONOSCOPY  2005   polyps, diverticulosis, rpt 7-10 yrs ([facility])   COLONOSCOPY  08/2015   HP, mild diverticulosis, rpt 5 yrs  ([facility])   COLONOSCOPY  08/2021   diverticulosis, small int hem, no f/u recommended ([facility])   DENTAL SURGERY     dental implants   ESOPHAGOGASTRODUODENOSCOPY  2005   antral gastropathy, neg H pylori screen ([facility])   TONSILLECTOMY  1976   TOTAL ABDOMINAL HYSTERECTOMY W/ BILATERAL SALPINGOOPHORECTOMY  2008   h/o abnormal pap smears    Family History  Problem Relation Age of Onset   Diabetes Mother        double amputee   Congestive Heart Failure Mother    Diabetes Father    Stroke Sister    Lupus Sister        X 2   Breast cancer Sister 60   Colon polyps Brother    Diabetes Brother    Cancer Brother        duodenal, prostate    Stroke Maternal Grandmother    Stroke Maternal Grandfather    Colon cancer Neg Hx    Thyroid disease Neg Hx    Rectal cancer Neg Hx    Stomach cancer Neg Hx    Esophageal cancer Neg Hx     Social History:  reports that she has never smoked. She has never used smokeless tobacco. She reports that she does not drink alcohol and does not use drugs.  Allergies: No Known Allergies    Review of Systems  She takes gabapentin at bedtime for meralgia paresthetica   Examination:   BP 128/74   Pulse 97   Ht 4\' 11"  (1.499 m)   Wt 156 lb (70.8 kg)   SpO2 98%   BMI 31.51 kg/m      Assessment/Plan:  HYPOTHYROIDISM:  Post ablative hypothyroidism after I-131 treatment done in April 2018  She has been taking 88 mcg levothyroxine, the dose being increased in 6/23  Subjectively doing well with this dose and has been regular with her levothyroxine  TSH is still in the normal range  She will continue the  same dose and follow-up in 6 months   Eleasha Cataldo 10/09/2022, 10:18 AM

## 2022-10-14 NOTE — Progress Notes (Signed)
Corrected 5in5 documentation

## 2022-12-11 ENCOUNTER — Other Ambulatory Visit: Payer: Self-pay | Admitting: Endocrinology

## 2023-01-08 ENCOUNTER — Other Ambulatory Visit: Payer: Self-pay | Admitting: Family Medicine

## 2023-01-08 DIAGNOSIS — Z1231 Encounter for screening mammogram for malignant neoplasm of breast: Secondary | ICD-10-CM

## 2023-02-16 ENCOUNTER — Ambulatory Visit
Admission: RE | Admit: 2023-02-16 | Discharge: 2023-02-16 | Disposition: A | Discharge status: Home / Self Care | Payer: Federal, State, Local not specified - PPO | Source: Ambulatory Visit | Attending: Family Medicine | Admitting: Family Medicine

## 2023-02-16 DIAGNOSIS — Z1231 Encounter for screening mammogram for malignant neoplasm of breast: Secondary | ICD-10-CM

## 2023-03-10 ENCOUNTER — Other Ambulatory Visit: Payer: Self-pay | Admitting: Family Medicine

## 2023-03-10 DIAGNOSIS — E559 Vitamin D deficiency, unspecified: Secondary | ICD-10-CM

## 2023-03-10 DIAGNOSIS — G5711 Meralgia paresthetica, right lower limb: Secondary | ICD-10-CM

## 2023-03-10 DIAGNOSIS — E7800 Pure hypercholesterolemia, unspecified: Secondary | ICD-10-CM

## 2023-03-11 ENCOUNTER — Other Ambulatory Visit (INDEPENDENT_AMBULATORY_CARE_PROVIDER_SITE_OTHER): Payer: Federal, State, Local not specified - PPO

## 2023-03-11 ENCOUNTER — Telehealth: Payer: Self-pay | Admitting: Family Medicine

## 2023-03-11 DIAGNOSIS — E7800 Pure hypercholesterolemia, unspecified: Secondary | ICD-10-CM

## 2023-03-11 DIAGNOSIS — E559 Vitamin D deficiency, unspecified: Secondary | ICD-10-CM

## 2023-03-11 DIAGNOSIS — G5711 Meralgia paresthetica, right lower limb: Secondary | ICD-10-CM

## 2023-03-11 LAB — VITAMIN D 25 HYDROXY (VIT D DEFICIENCY, FRACTURES): VITD: 49.21 ng/mL (ref 30.00–100.00)

## 2023-03-11 LAB — BASIC METABOLIC PANEL
BUN: 14 mg/dL (ref 6–23)
CO2: 27 mEq/L (ref 19–32)
Calcium: 9.5 mg/dL (ref 8.4–10.5)
Chloride: 106 mEq/L (ref 96–112)
Creatinine, Ser: 0.97 mg/dL (ref 0.40–1.20)
GFR: 57.39 mL/min — ABNORMAL LOW (ref >60.00)
Glucose, Bld: 97 mg/dL (ref 70–99)
Potassium: 4.2 mEq/L (ref 3.5–5.1)
Sodium: 140 mEq/L (ref 135–145)

## 2023-03-11 LAB — VITAMIN B12: Vitamin B-12: >1501 pg/mL — ABNORMAL HIGH (ref 211–911)

## 2023-03-11 NOTE — Telephone Encounter (Signed)
Ok to draw endo labs per pt request.  Plz notify pt. Thanks.

## 2023-03-11 NOTE — Telephone Encounter (Signed)
Patient called regarding lab visit from today, wanted to know if TSH was drawn? States she was coming here to have those labs drawn for Endocrinology when she sees the provider there next week. Patient wanted to know if these were done? Please advise patient if needed at [phone]

## 2023-03-12 ENCOUNTER — Ambulatory Visit (INDEPENDENT_AMBULATORY_CARE_PROVIDER_SITE_OTHER): Payer: Federal, State, Local not specified - PPO

## 2023-03-12 DIAGNOSIS — E890 Postprocedural hypothyroidism: Secondary | ICD-10-CM

## 2023-03-12 LAB — TSH: TSH: 0.58 u[IU]/mL (ref 0.35–5.50)

## 2023-03-12 LAB — T4, FREE: Free T4: 1.29 ng/dL (ref 0.60–1.60)

## 2023-03-12 NOTE — Telephone Encounter (Signed)
Called and left message advising patient that labs can be added on per our lab staff. Orders to add labs on have been faxed per lab.

## 2023-03-12 NOTE — Telephone Encounter (Addendum)
Noted. Thanks.

## 2023-03-17 ENCOUNTER — Other Ambulatory Visit: Payer: Self-pay | Admitting: Family Medicine

## 2023-03-18 ENCOUNTER — Encounter: Payer: Self-pay | Admitting: Endocrinology

## 2023-03-18 ENCOUNTER — Ambulatory Visit: Payer: Federal, State, Local not specified - PPO | Admitting: Endocrinology

## 2023-03-18 VITALS — BP 115/60 | HR 79 | Ht 59.0 in | Wt 152.4 lb

## 2023-03-18 DIAGNOSIS — Z8639 Personal history of other endocrine, nutritional and metabolic disease: Secondary | ICD-10-CM

## 2023-03-18 DIAGNOSIS — E890 Postprocedural hypothyroidism: Secondary | ICD-10-CM

## 2023-03-18 MED ORDER — LEVOTHYROXINE SODIUM 88 MCG PO TABS: 88.0000 ug | ORAL_TABLET | Freq: Every day | ORAL | 4 refills | Status: DC

## 2023-03-18 NOTE — Progress Notes (Signed)
Outpatient Endocrinology Note Kelsey Royalty Fakhouri, MD  03/18/23  Patient's Name: Kelsey Yoder    DOB: [birth year]    MRN: [id number]  REASON OF VISIT: Follow-up for hypothyroidism  PCP: Eustaquio Boyden, MD  HISTORY OF PRESENT ILLNESS:   Kelsey Yoder is a 77 y.o. old female with past medical history as listed below is presented for a follow up of postablative hypothyroidism.   Pertinent Thyroid History: Patient has history of Graves' disease had positive thyrotropin receptor antibody, treated with RAI I-131 on October 24, 2016, for uptake was 82%.  She subsequently developed hypothyroid and is started on thyroid hormone replacement in October 2018.  Levothyroxine dose was adjusted over time.   Interval history 03/18/23 Patient is currently taking levothyroxine 88 mcg daily.  She used to have fatigue however now she feels like normal and usual energy level.  No change in bowel habit.  No palpitation.  No redness, watering or irritation of the eyes.  No complaints today.  Recent thyroid lab normal as follows.  Latest Reference Range & Units 03/12/23 13:11  TSH 0.35 - 5.50 uIU/mL 0.58  T4,Free(Direct) 0.60 - 1.60 ng/dL 4.09    REVIEW OF SYSTEMS:  As per history of present illness.   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   Cataract    Hyperlipidemia    Hyperthyroidism 01/07/2016   Dr Lucianne Muss, positive TSHR Ab   Osteopenia    PONV (postoperative nausea and vomiting)    post-op nausea   Sinus drainage     PAST SURGICAL HISTORY: Past Surgical History:  Procedure Laterality Date   BREAST BIOPSY Right 2010   benign    BREAST EXCISIONAL BIOPSY Right 2010   benign   CARPAL TUNNEL RELEASE Right 04/25/2013   CARPAL TUNNEL RELEASE;  Surgeon: Tami Ribas, MD;  Location: [facility]   CARPAL TUNNEL RELEASE Left 09/24/2020   Procedure: LEFT  CARPAL TUNNEL RELEASE;  Surgeon: Betha Loa, MD;  Location:  SURGERY CENTER;  Service: Orthopedics;  Laterality: Left;  30 MINUTES   CATARACT EXTRACTION Bilateral 2013   COLONOSCOPY  2002   rectosigmoid bulge thought 2/2 fibroids   COLONOSCOPY  2005   polyps, diverticulosis, rpt 7-10 yrs ([facility])   COLONOSCOPY  08/2015   HP, mild diverticulosis, rpt 5 yrs  ([facility])   COLONOSCOPY  08/2021   diverticulosis, small int hem, no f/u recommended ([facility])   DENTAL SURGERY     dental implants   ESOPHAGOGASTRODUODENOSCOPY  2005   antral gastropathy, neg H pylori screen ([facility])   TONSILLECTOMY  1976   TOTAL ABDOMINAL HYSTERECTOMY W/ BILATERAL SALPINGOOPHORECTOMY  2008   h/o abnormal pap smears    ALLERGIES: No Known Allergies  FAMILY HISTORY:  Family History  Problem Relation Age of Onset   Diabetes Mother        double amputee   Congestive Heart Failure Mother    Diabetes Father    Stroke Sister    Lupus Sister        X 2   Breast cancer Sister 83   Colon polyps Brother    Diabetes Brother    Cancer Brother        duodenal, prostate    Stroke Maternal Grandmother    Stroke Maternal Grandfather    Colon cancer Neg Hx    Thyroid disease Neg Hx  Rectal cancer Neg Hx    Stomach cancer Neg Hx    Esophageal cancer Neg Hx     SOCIAL HISTORY: Social History   Socioeconomic History   Marital status: Married    Spouse name: Not on file   Number of children: Not on file   Years of education: Not on file   Highest education level: Not on file  Occupational History   Not on file  Tobacco Use   Smoking status: Never   Smokeless tobacco: Never  Vaping Use   Vaping status: Never Used  Substance and Sexual Activity   Alcohol use: No    Alcohol/week: 0.0 standard drinks of alcohol   Drug use: No   Sexual activity: Yes    Partners: Male    Birth control/protection: Surgical  Other Topics Concern   Not on file  Social History Narrative   Caffeine: non    Lives alone, no pets   Occupation: retired, 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   Activity: goes to gym 3-4x/wk   Diet: some water, fruits and vegetables daily   Right handed    Social Determinants of Health   Financial Resource Strain: Not on file  Food Insecurity: Not on file  Transportation Needs: Not on file  Physical Activity: Not on file  Stress: Not on file  Social Connections: Not on file    MEDICATIONS:  Current Outpatient Medications  Medication Sig Dispense Refill   Calcium Carb-Cholecalciferol 600-800 MG-UNIT TABS Take 1 tablet by mouth daily.     Cholecalciferol (VITAMIN D3) 50 MCG (2000 UT) capsule Take 1 capsule by mouth daily.     ELDERBERRY PO Take by mouth daily at 12 noon.     gabapentin (NEURONTIN) 300 MG capsule Take 1 capsule (300 mg total) by mouth 2 (two) times daily as needed (neuropathy pain). 90 capsule 3   Multiple Vitamin (MULTIVITAMIN) tablet Take 1 tablet by mouth daily.     Turmeric 500 MG CAPS Take by mouth.     levothyroxine (SYNTHROID) 88 MCG tablet Take 1 tablet (88 mcg total) by mouth daily. 90 tablet 4   predniSONE (DELTASONE) 20 MG tablet 2 tabs po daily for 5 days, then 1 tab po daily for 5 days 15 tablet 0   No current facility-administered medications for this visit.    PHYSICAL EXAM: Vitals:   03/18/23 1009  BP: 115/60  Pulse: 79  SpO2: 98%  Weight: 152 lb 6.4 oz (69.1 kg)  Height: 4\' 11"  (1.499 m)   Body mass index is 30.78 kg/m.  Wt Readings from Last 3 Encounters:  03/18/23 152 lb 6.4 oz (69.1 kg)  10/09/22 156 lb (70.8 kg)  09/18/22 152 lb 6 oz (69.1 kg)     General: Well developed, well nourished female in no apparent distress.  HEENT: AT/[place], no external lesions. Hearing intact to the spoken word Eyes: No stare, proptosis or lid lag. Conjunctiva clear and no icterus. Neck: Trachea midline, neck supple without appreciable thyromegaly or lymphadenopathy and no palpable thyroid nodules Lungs: Clear to auscultation,  no wheeze. Respirations not labored Heart: S1S2, Regular in rate and rhythm. Abdomen: Soft, non tender Neurologic: Alert, oriented, normal speech, deep tendon biceps reflexes normal,  no gross focal neurological deficit Extremities: No pedal pitting edema, no tremors of outstretched hands Skin: Warm, color good.  Psychiatric: Does not appear depressed or anxious  PERTINENT HISTORIC LABORATORY AND IMAGING STUDIES:  All pertinent laboratory results were reviewed. Please see HPI also  for further details.   TSH  Date Value Ref Range Status  03/12/2023 0.58 0.35 - 5.50 uIU/mL Final  10/07/2022 0.45 0.35 - 5.50 uIU/mL Final  03/31/2022 1.30 0.35 - 5.50 uIU/mL Final   T3, Total  Date Value Ref Range Status  01/03/2016 271.0 (H) 76 - 181 ng/dL Final     ASSESSMENT / PLAN  1. Postablative hypothyroidism   2. H/O Graves' disease    -Patient was treated with radioactive iodine I-131 ablation in April 2018 and subsequently developed postablative hypothyroidism from October 2018. -Currently taking levothyroxine 88 mcg daily.  Plan: -Recent thyroid function test normal.  She is clinically euthyroid today. -Continue current dose of levothyroxine 88 mcg daily.   Diagnoses and all orders for this visit:  Postablative hypothyroidism -     T4, free; Future -     TSH; Future  H/O Graves' disease  Other orders -     levothyroxine (SYNTHROID) 88 MCG tablet; Take 1 tablet (88 mcg total) by mouth daily.    DISPOSITION Follow up in clinic in 6 months suggested.  Labs 1 week prior to visit.  All questions answered and patient verbalized understanding of the plan.  Kelsey Quierra Silverio, MD [facility] Endocrinology [facility] [address] [place], [address] [place], [place] [address] Phone # [phone]  At least part of this note was generated using voice recognition software. Inadvertent word errors may have occurred, which were not recognized during the proofreading process.

## 2023-09-03 ENCOUNTER — Other Ambulatory Visit: Payer: Self-pay | Admitting: Family Medicine

## 2023-09-03 DIAGNOSIS — E7800 Pure hypercholesterolemia, unspecified: Secondary | ICD-10-CM

## 2023-09-03 DIAGNOSIS — E559 Vitamin D deficiency, unspecified: Secondary | ICD-10-CM

## 2023-09-03 DIAGNOSIS — E890 Postprocedural hypothyroidism: Secondary | ICD-10-CM

## 2023-09-04 ENCOUNTER — Other Ambulatory Visit (INDEPENDENT_AMBULATORY_CARE_PROVIDER_SITE_OTHER): Payer: Federal, State, Local not specified - PPO

## 2023-09-04 DIAGNOSIS — E559 Vitamin D deficiency, unspecified: Secondary | ICD-10-CM

## 2023-09-04 DIAGNOSIS — E890 Postprocedural hypothyroidism: Secondary | ICD-10-CM

## 2023-09-04 DIAGNOSIS — E7800 Pure hypercholesterolemia, unspecified: Secondary | ICD-10-CM | POA: Diagnosis not present

## 2023-09-04 LAB — COMPREHENSIVE METABOLIC PANEL
ALT: 13 U/L (ref 0–35)
AST: 16 U/L (ref 0–37)
Albumin: 4.1 g/dL (ref 3.5–5.2)
Alkaline Phosphatase: 98 U/L (ref 39–117)
BUN: 15 mg/dL (ref 6–23)
CO2: 30 meq/L (ref 19–32)
Calcium: 9.4 mg/dL (ref 8.4–10.5)
Chloride: 103 meq/L (ref 96–112)
Creatinine, Ser: 0.95 mg/dL (ref 0.40–1.20)
GFR: 58.64 mL/min — ABNORMAL LOW (ref >60.00)
Glucose, Bld: 93 mg/dL (ref 70–99)
Potassium: 4.4 meq/L (ref 3.5–5.1)
Sodium: 140 meq/L (ref 135–145)
Total Bilirubin: 0.5 mg/dL (ref 0.2–1.2)
Total Protein: 7 g/dL (ref 6.0–8.3)

## 2023-09-04 LAB — LIPID PANEL
Cholesterol: 242 mg/dL — ABNORMAL HIGH (ref 0–200)
HDL: 50.4 mg/dL (ref >39.00)
LDL Cholesterol: 161 mg/dL — ABNORMAL HIGH (ref 0–99)
NonHDL: 191.26
Total CHOL/HDL Ratio: 5
Triglycerides: 149 mg/dL (ref 0.0–149.0)
VLDL: 29.8 mg/dL (ref 0.0–40.0)

## 2023-09-04 LAB — VITAMIN D 25 HYDROXY (VIT D DEFICIENCY, FRACTURES): VITD: 44.72 ng/mL (ref 30.00–100.00)

## 2023-09-04 LAB — TSH: TSH: 2.44 u[IU]/mL (ref 0.35–5.50)

## 2023-09-04 LAB — T4, FREE: Free T4: 1.03 ng/dL (ref 0.60–1.60)

## 2023-09-08 ENCOUNTER — Other Ambulatory Visit: Payer: Federal, State, Local not specified - PPO

## 2023-09-10 ENCOUNTER — Ambulatory Visit (INDEPENDENT_AMBULATORY_CARE_PROVIDER_SITE_OTHER): Payer: Federal, State, Local not specified - PPO | Admitting: Family Medicine

## 2023-09-10 ENCOUNTER — Encounter: Payer: Self-pay | Admitting: Family Medicine

## 2023-09-10 VITALS — BP 122/64 | HR 77 | Temp 97.9°F | Ht 59.0 in | Wt 154.1 lb

## 2023-09-10 DIAGNOSIS — E7800 Pure hypercholesterolemia, unspecified: Secondary | ICD-10-CM | POA: Diagnosis not present

## 2023-09-10 DIAGNOSIS — Z0000 Encounter for general adult medical examination without abnormal findings: Secondary | ICD-10-CM

## 2023-09-10 DIAGNOSIS — E559 Vitamin D deficiency, unspecified: Secondary | ICD-10-CM

## 2023-09-10 DIAGNOSIS — M85851 Other specified disorders of bone density and structure, right thigh: Secondary | ICD-10-CM | POA: Diagnosis not present

## 2023-09-10 DIAGNOSIS — E890 Postprocedural hypothyroidism: Secondary | ICD-10-CM

## 2023-09-10 NOTE — Assessment & Plan Note (Signed)
 Preventative protocols reviewed and updated unless pt declined. Discussed healthy diet and lifestyle.

## 2023-09-10 NOTE — Progress Notes (Signed)
 Ph: [phone] Fax: [phone]   Patient ID: Kelsey Yoder, female    DOB: [birth year], 78 y.o.   MRN: [id number]  This visit was conducted in person.  BP 122/64   Pulse 77   Temp 97.9 F (36.6 C) (Oral)   Ht 4\' 11"  (1.499 m)   Wt 154 lb 2 oz (69.9 kg)   SpO2 94%   BMI 31.13 kg/m    CC: CPE Subjective:   HPI: Kelsey Yoder is a 78 y.o. female presenting on 09/10/2023 for Annual Exam   Has medicare part A.   Post-ablative hypothyroidism after I-131 treatment 10/2016 - followed by endo, takes thyroid replacement 6 days a week, now seeing Dr Erroll Luna.   Numbness to R lateral thigh worse at night time - this is actually better, has been able to back off gabapentin 300mg  and only take PRN.   Preventative: COLONOSCOPY 08/2015; HP, mild diverticulosis, rpt 5 yrs  ([facility])  Colonoscopy 08/2021 - diverticulosis, small int hem, no f/u recommended ([facility])  Well woman - OBGYN 08/2020 (Dr. Macon Large) - released from care. S/p total hysterectomy 2008 for fibroids and abnormal pap smears, ovaries removed as well. Did take HRT for about 10 yrs.  Mammogram [facility]/2023 - [facility] DEXA - 11/2016 - osteopenia, T -1.8 at hip.  DEXA 02/2022 - T -1.7 R fem neck, not at increased fracture risk  Regular with calcium and vit D and weight bearing exercise.  Lung cancer screening - not eligible  Flu shot today COVID vaccine Pfizer 08/2019, 09/2019, booster 12/2020  Td 2000, 2010 Pneumovax 2014. Prevnar-13 05/2014  Zostavax 2012  Shingrix - discussed, will consider Advanced directive: living will in chart 06/2014 - no prolonged life support if terminal condition. Would want HCPOA to be sister Sheldon Silvan. Since then married ,may want husband to be HCPOA - will look at forms to update.  Seat belt use discussed Sunscreen use discussed. 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 Interim medical history since our last visit reviewed. Allergies and medications reviewed and updated. Outpatient Medications Prior to Visit  Medication Sig Dispense Refill   Calcium Carb-Cholecalciferol 600-800 MG-UNIT TABS Take 1 tablet by mouth daily.     Cholecalciferol (VITAMIN D3) 50 MCG (2000 UT) capsule Take 1 capsule by mouth daily.     ELDERBERRY PO Take by mouth daily at 12 noon.     gabapentin (NEURONTIN) 300 MG capsule Take 1 capsule (300 mg total) by mouth 2 (two) times daily as needed (neuropathy pain). 90 capsule 3   levothyroxine (SYNTHROID) 88 MCG tablet Take 1 tablet (88 mcg total) by mouth daily. 90 tablet 4   Multiple Vitamin (MULTIVITAMIN) tablet Take 1 tablet by mouth daily.     Turmeric 500 MG CAPS Take by mouth.     predniSONE (DELTASONE) 20 MG tablet 2 tabs po daily for 5 days, then 1 tab po daily for 5 days 15 tablet 0   No facility-administered medications  prior to visit.     Per HPI unless specifically indicated in ROS section below Review of Systems  Constitutional:  Negative for activity change, appetite change, chills, fatigue, fever and unexpected weight change.  HENT:  Negative for hearing loss.   Eyes:  Negative for visual disturbance.  Respiratory:  Negative for cough, chest tightness, shortness of breath and wheezing.   Cardiovascular:  Negative for chest pain, palpitations and leg swelling.  Gastrointestinal:  Negative for abdominal distention, abdominal pain, blood in stool, constipation, diarrhea, nausea and vomiting.  Genitourinary:  Negative for  difficulty urinating and hematuria.  Musculoskeletal:  Negative for arthralgias, myalgias and neck pain.  Skin:  Negative for rash.  Neurological:  Negative for dizziness, seizures, syncope and headaches.  Hematological:  Negative for adenopathy. Does not bruise/bleed easily.  Psychiatric/Behavioral:  Negative for dysphoric mood. The patient is not nervous/anxious.     Objective:  BP 122/64   Pulse 77   Temp 97.9 F (36.6 C) (Oral)   Ht 4\' 11"  (1.499 m)   Wt 154 lb 2 oz (69.9 kg)   SpO2 94%   BMI 31.13 kg/m   Wt Readings from Last 3 Encounters:  09/10/23 154 lb 2 oz (69.9 kg)  03/18/23 152 lb 6.4 oz (69.1 kg)  10/09/22 156 lb (70.8 kg)      Physical Exam Vitals and nursing note reviewed.  Constitutional:      Appearance: Normal appearance. She is not ill-appearing.  HENT:     Head: Normocephalic and atraumatic.     Right Ear: Tympanic membrane, ear canal and external ear normal. There is no impacted cerumen.     Left Ear: Tympanic membrane, ear canal and external ear normal. There is no impacted cerumen.     Mouth/Throat:     Mouth: Mucous membranes are moist.     Pharynx: Oropharynx is clear. No oropharyngeal exudate or posterior oropharyngeal erythema.  Eyes:     General:        Right eye: No discharge.        Left eye: No discharge.     Extraocular Movements: Extraocular movements intact.     Conjunctiva/sclera: Conjunctivae normal.     Pupils: Pupils are equal, round, and reactive to light.  Neck:     Thyroid: No thyroid mass or thyromegaly.     Vascular: No carotid bruit.  Cardiovascular:     Rate and Rhythm: Normal rate and regular rhythm.     Pulses: Normal pulses.     Heart sounds: Normal heart sounds. No murmur heard. Pulmonary:     Effort: Pulmonary effort is normal. No respiratory distress.     Breath sounds: Normal breath sounds. No wheezing, rhonchi or rales.  Abdominal:     General: Bowel sounds are normal. There is no distension.     Palpations:  Abdomen is soft. There is no mass.     Tenderness: There is no abdominal tenderness. There is no guarding or rebound.     Hernia: No hernia is present.  Musculoskeletal:     Cervical back: Normal range of motion and neck supple. No rigidity.     Right lower leg: No edema.     Left lower leg: No edema.  Lymphadenopathy:     Cervical: No cervical adenopathy.  Skin:    General: Skin is warm and dry.     Findings: No rash.  Neurological:     General: No focal deficit present.     Mental Status: She  is alert. Mental status is at baseline.  Psychiatric:        Mood and Affect: Mood normal.        Behavior: Behavior normal.       Results for orders placed or performed in visit on 09/04/23  VITAMIN D 25 Hydroxy (Vit-D Deficiency, Fractures)   Collection Time: 09/04/23  8:17 AM  Result Value Ref Range   VITD 44.72 30.00 - 100.00 ng/mL  Comprehensive metabolic panel   Collection Time: 09/04/23  8:17 AM  Result Value Ref Range   Sodium 140 135 - 145 mEq/L   Potassium 4.4 3.5 - 5.1 mEq/L   Chloride 103 96 - 112 mEq/L   CO2 30 19 - 32 mEq/L   Glucose, Bld 93 70 - 99 mg/dL   BUN 15 6 - 23 mg/dL   Creatinine, Ser 1.61 0.40 - 1.20 mg/dL   Total Bilirubin 0.5 0.2 - 1.2 mg/dL   Alkaline Phosphatase 98 39 - 117 U/L   AST 16 0 - 37 U/L   ALT 13 0 - 35 U/L   Total Protein 7.0 6.0 - 8.3 g/dL   Albumin 4.1 3.5 - 5.2 g/dL   GFR 09.60 (L) >45.40 mL/min   Calcium 9.4 8.4 - 10.5 mg/dL  Lipid panel   Collection Time: 09/04/23  8:17 AM  Result Value Ref Range   Cholesterol 242 (H) 0 - 200 mg/dL   Triglycerides 981.1 0.0 - 149.0 mg/dL   HDL 91.47 >82.95 mg/dL   VLDL 62.1 0.0 - 30.8 mg/dL   LDL Cholesterol 657 (H) 0 - 99 mg/dL   Total CHOL/HDL Ratio 5    NonHDL 191.26   TSH   Collection Time: 09/04/23  8:17 AM  Result Value Ref Range   TSH 2.44 0.35 - 5.50 uIU/mL  T4, free   Collection Time: 09/04/23  8:17 AM  Result Value Ref Range   Free T4 1.03 0.60 - 1.60 ng/dL    Assessment &  Plan:   Problem List Items Addressed This Visit     Health maintenance examination - Primary (Chronic)   Preventative protocols reviewed and updated unless pt declined. Discussed healthy diet and lifestyle.       Hyperlipidemia   Chronic, not on statin. Deteriorated control noted, reviewed diet choices to improve cholesterol levels. Reviewed ASCVD risk. Discussed option for statin to decrease cardiovascular risk (in fmhx). She will instead return in 4-6 months for rpt FLP.  The 10-year ASCVD risk score (Arnett DK, et al., 2019) is: 17.7%   Values used to calculate the score:     Age: 75 years     Sex: Female     Is Non-Hispanic African American: Yes     Diabetic: No     Tobacco smoker: No     Systolic Blood Pressure: 122 mmHg     Is BP treated: No     HDL Cholesterol: 50.4 mg/dL     Total Cholesterol: 242 mg/dL       Osteopenia   Chronic, stable osteopenia not at increased fracture risk based on latest DEXA - continue calcium, vit D, regular weight bearing exercise.       Vitamin D deficiency   Continue vit D 2000 units daily.       Postablative hypothyroidism   Appreciate endo care - continue levothyroxine        No orders of the defined types were placed in this encounter.   No orders of the defined types were placed  in this encounter.   Patient Instructions  Return in 6 months for fasting lab visit only to recheck cholesterol Consider shingrix series.  Good to see you today Return as needed or in 1 year for next physical   Follow up plan: Return in about 1 year (around 09/09/2024) for annual exam, prior fasting for blood work.  Eustaquio Boyden, MD

## 2023-09-10 NOTE — Assessment & Plan Note (Signed)
 Continue vit D 2000 units daily

## 2023-09-10 NOTE — Assessment & Plan Note (Addendum)
 Chronic, stable osteopenia not at increased fracture risk based on latest DEXA - continue calcium, vit D, regular weight bearing exercise.

## 2023-09-10 NOTE — Patient Instructions (Addendum)
 Return in 6 months for fasting lab visit only to recheck cholesterol Consider shingrix series.  Good to see you today Return as needed or in 1 year for next physical

## 2023-09-10 NOTE — Assessment & Plan Note (Addendum)
 Chronic, not on statin. Deteriorated control noted, reviewed diet choices to improve cholesterol levels. Reviewed ASCVD risk. Discussed option for statin to decrease cardiovascular risk (in fmhx). She will instead return in 4-6 months for rpt FLP.  The 10-year ASCVD risk score (Arnett DK, et al., 2019) is: 17.7%   Values used to calculate the score:     Age: 78 years     Sex: Female     Is Non-Hispanic African American: Yes     Diabetic: No     Tobacco smoker: No     Systolic Blood Pressure: 122 mmHg     Is BP treated: No     HDL Cholesterol: 50.4 mg/dL     Total Cholesterol: 242 mg/dL

## 2023-09-10 NOTE — Assessment & Plan Note (Signed)
 Appreciate endo care - continue levothyroxine

## 2023-09-11 ENCOUNTER — Encounter: Payer: Federal, State, Local not specified - PPO | Admitting: Family Medicine

## 2023-09-15 ENCOUNTER — Ambulatory Visit: Payer: Federal, State, Local not specified - PPO | Admitting: Endocrinology

## 2023-09-15 ENCOUNTER — Encounter: Payer: Self-pay | Admitting: Endocrinology

## 2023-09-15 VITALS — BP 122/64 | HR 80 | Ht 59.0 in | Wt 156.0 lb

## 2023-09-15 DIAGNOSIS — E890 Postprocedural hypothyroidism: Secondary | ICD-10-CM | POA: Diagnosis not present

## 2023-09-15 DIAGNOSIS — Z8639 Personal history of other endocrine, nutritional and metabolic disease: Secondary | ICD-10-CM | POA: Diagnosis not present

## 2023-09-15 NOTE — Progress Notes (Signed)
 Outpatient Endocrinology Note Kelsey Jesusmanuel Erbes, MD  09/15/23  Patient's Name: Kelsey Yoder    DOB: Jun 19, 1946    MRN: [id number]  REASON OF VISIT: Follow-up for hypothyroidism  PCP: Eustaquio Boyden, MD  HISTORY OF PRESENT ILLNESS:   Kelsey Yoder is a 78 y.o. old female with past medical history as listed below is presented for a follow up of postablative hypothyroidism.   Pertinent Thyroid History: Patient has history of Graves' disease had positive thyrotropin receptor antibody, treated with RAI I-131 on October 24, 2016, for uptake was 82%.  She subsequently developed hypothyroid and was started on thyroid hormone replacement in October 2018.  Levothyroxine dose was adjusted over time.   Interval history  Patient has been taking levothyroxine 88 mcg daily.  She has been taking in the morning before breakfast and reports compliance.  No palpitation or heat intolerance.  Overall feeling normal.  No redness or watering of the eyes.  Overall feeling good.  She had normal thyroid function test recently as follows.   Latest Reference Range & Units 09/04/23 08:17  TSH 0.35 - 5.50 uIU/mL 2.44  T4,Free(Direct) 0.60 - 1.60 ng/dL 4.09    REVIEW OF SYSTEMS:  As per history of present illness.   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   Cataract    Hyperlipidemia    Hyperthyroidism 01/07/2016   Dr Lucianne Muss, positive TSHR Ab   Osteopenia    PONV (postoperative nausea and vomiting)    post-op nausea   Sinus drainage     PAST SURGICAL HISTORY: Past Surgical History:  Procedure Laterality Date   ABDOMINAL HYSTERECTOMY  2004   BREAST BIOPSY Right 2010   benign    BREAST EXCISIONAL BIOPSY Right 2010   benign   CARPAL TUNNEL RELEASE Right 04/25/2013   CARPAL TUNNEL RELEASE;  Surgeon: Tami Ribas, MD;  Location: [facility]    CARPAL TUNNEL RELEASE Left 09/24/2020   Procedure: LEFT CARPAL TUNNEL RELEASE;  Surgeon: Betha Loa, MD;  Location: [facility];  Service: Orthopedics;  Laterality: Left;  30 MINUTES   CATARACT EXTRACTION Bilateral 2013   COLONOSCOPY  2002   rectosigmoid bulge thought 2/2 fibroids   COLONOSCOPY  2005   polyps, diverticulosis, rpt 7-10 yrs ([facility])   COLONOSCOPY  08/2015   HP, mild diverticulosis, rpt 5 yrs  ([facility])   COLONOSCOPY  08/2021   diverticulosis, small int hem, no f/u recommended ([facility])   DENTAL SURGERY     dental implants   ESOPHAGOGASTRODUODENOSCOPY  2005   antral gastropathy, neg H pylori screen ([facility])   EYE SURGERY  2012   TONSILLECTOMY  1976   TOTAL ABDOMINAL HYSTERECTOMY W/ BILATERAL SALPINGOOPHORECTOMY  2008   h/o abnormal pap smears    ALLERGIES: No Known Allergies  FAMILY HISTORY:  Family History  Problem Relation Age of Onset   Diabetes Mother        double amputee   Congestive Heart Failure Mother    Diabetes Father    Breast cancer Sister    Stroke Sister 74   Arthritis Sister    Lung cancer Sister    Lupus Sister    Kidney disease Sister    Colon polyps Brother    Diabetes Brother    Cancer Brother        duodenal, prostate  Stroke Brother 7   CAD Brother 34       bypass   Stroke Maternal Grandmother    Stroke Maternal Grandfather    Colon cancer Neg Hx    Thyroid disease Neg Hx    Rectal cancer Neg Hx    Stomach cancer Neg Hx    Esophageal cancer Neg Hx     SOCIAL HISTORY: Social History   Socioeconomic History   Marital status: Married    Spouse name: Not on file   Number of children: Not on file   Years of education: Not on file   Highest education level: Master's degree (e.g., MA, MS, MEng, MEd, MSW, MBA)  Occupational History   Not on file  Tobacco Use   Smoking status: Never   Smokeless tobacco: Never  Vaping Use   Vaping status: Never Used  Substance and Sexual Activity   Alcohol use:  Never   Drug use: Never   Sexual activity: Yes    Partners: Male    Birth control/protection: Surgical, None  Other Topics Concern   Not on file  Social History Narrative   Caffeine: non   Lives alone, no pets   Occupation: retired, 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   Activity: goes to gym 3-4x/wk   Diet: some water, fruits and vegetables daily   Right handed    Social Drivers of Corporate investment banker Strain: Low Risk  (09/06/2023)   Overall Financial Resource Strain (CARDIA)    Difficulty of Paying Living Expenses: Not hard at all  Food Insecurity: No Food Insecurity (09/06/2023)   Hunger Vital Sign    Worried About Running Out of Food in the Last Year: Never true    Ran Out of Food in the Last Year: Never true  Transportation Needs: No Transportation Needs (09/06/2023)   PRAPARE - Transportation    Lack of Transportation (Medical): No    Lack of Transportation (Non-Medical): No  Physical Activity: Insufficiently Active (09/06/2023)   Exercise Vital Sign    Days of Exercise per Week: 3 days    Minutes of Exercise per Session: 40 min  Stress: Stress Concern Present (09/06/2023)   [employer] of Occupational Health - Occupational Stress Questionnaire    Feeling of Stress : To some extent  Social Connections: Socially Integrated (09/06/2023)   Social Connection and Isolation Panel [NHANES]    Frequency of Communication with Friends and Family: More than three times a week    Frequency of Social Gatherings with Friends and Family: Three times a week    Attends Religious Services: More than 4 times per year    Active Member of Clubs or Organizations: Yes    Attends Banker Meetings: More than 4 times per year    Marital Status: Married    MEDICATIONS:  Current Outpatient Medications  Medication Sig Dispense Refill   Calcium Carb-Cholecalciferol 600-800 MG-UNIT TABS Take 1 tablet by mouth daily.     Cholecalciferol (VITAMIN D3) 50 MCG (2000 UT)  capsule Take 1 capsule by mouth daily.     ELDERBERRY PO Take by mouth daily at 12 noon.     gabapentin (NEURONTIN) 300 MG capsule Take 1 capsule (300 mg total) by mouth 2 (two) times daily as needed (neuropathy pain). 90 capsule 3   levothyroxine (SYNTHROID) 88 MCG tablet Take 1 tablet (88 mcg total) by mouth daily. 90 tablet 4   Multiple Vitamin (MULTIVITAMIN) tablet Take 1 tablet by mouth daily.  Turmeric 500 MG CAPS Take by mouth.     No current facility-administered medications for this visit.    PHYSICAL EXAM: Vitals:   09/15/23 1111  BP: 122/64  Pulse: 80  SpO2: 96%  Weight: 156 lb (70.8 kg)  Height: 4\' 11"  (1.499 m)   Body mass index is 31.51 kg/m.  Wt Readings from Last 3 Encounters:  09/15/23 156 lb (70.8 kg)  09/10/23 154 lb 2 oz (69.9 kg)  03/18/23 152 lb 6.4 oz (69.1 kg)     General: Well developed, well nourished female in no apparent distress.  HEENT: AT/[place], no external lesions. Hearing intact to the spoken word Eyes: No stare, proptosis or lid lag. Conjunctiva clear and no icterus. Neck: Trachea midline, neck supple without appreciable thyromegaly or lymphadenopathy and no palpable thyroid nodules Abdomen: Soft, non tender Neurologic: Alert, oriented, normal speech, deep tendon biceps reflexes normal,  no gross focal neurological deficit Extremities: No pedal pitting edema, no tremors of outstretched hands Skin: Warm, color good.  Psychiatric: Does not appear depressed or anxious  PERTINENT HISTORIC LABORATORY AND IMAGING STUDIES:  All pertinent laboratory results were reviewed. Please see HPI also for further details.   TSH  Date Value Ref Range Status  09/04/2023 2.44 0.35 - 5.50 uIU/mL Final  03/12/2023 0.58 0.35 - 5.50 uIU/mL Final  10/07/2022 0.45 0.35 - 5.50 uIU/mL Final   T3, Total  Date Value Ref Range Status  01/03/2016 271.0 (H) 76 - 181 ng/dL Final     ASSESSMENT / PLAN  1. Postablative hypothyroidism   2. H/O Graves' disease      -Patient was treated with radioactive iodine I-131 ablation in April 2018 and subsequently developed postablative hypothyroidism from October 2018. -Currently taking levothyroxine 88 mcg daily.  Plan: -Recent thyroid function test normal.  She is clinically euthyroid today. -Continue current dose of levothyroxine 88 mcg daily. -Patient prefers to complete lab prior to follow-up visit at [facility] lab, order placed.  Kitiara "Cindi Carbon" was seen today for follow-up.  Diagnoses and all orders for this visit:  Postablative hypothyroidism -     T4, free; Future -     TSH; Future  H/O Graves' disease   DISPOSITION Follow up in clinic in 6 months suggested.  All questions answered and patient verbalized understanding of the plan.  Kelsey Mariangela Heldt, MD [facility] Endocrinology [facility] [address] [place], [address] [place], [place] [address] Phone # [phone]  At least part of this note was generated using voice recognition software. Inadvertent word errors may have occurred, which were not recognized during the proofreading process.

## 2023-09-15 NOTE — Patient Instructions (Signed)
 Thyroid lab 1 week before visit in 6 months.

## 2024-01-06 ENCOUNTER — Other Ambulatory Visit: Payer: Self-pay | Admitting: Family Medicine

## 2024-01-06 DIAGNOSIS — Z1231 Encounter for screening mammogram for malignant neoplasm of breast: Secondary | ICD-10-CM

## 2024-02-12 ENCOUNTER — Telehealth: Payer: Self-pay

## 2024-02-12 DIAGNOSIS — R7989 Other specified abnormal findings of blood chemistry: Secondary | ICD-10-CM

## 2024-02-12 DIAGNOSIS — E7800 Pure hypercholesterolemia, unspecified: Secondary | ICD-10-CM

## 2024-02-12 NOTE — Telephone Encounter (Signed)
 Copied from CRM #[id number]. Topic: Clinical - Request for Lab/Test Order >> Feb 11, 2024  1:52 PM Kelsey Yoder wrote: Reason for CRM: Patient would like an order for her cholesterol to be checked along with her TSH & T4.

## 2024-02-14 NOTE — Telephone Encounter (Addendum)
 Noted. FLP ordered. She already has thyroid  labs ordered by endo - she will need to remember to request these drawn for her as well when she comes in for our labs.

## 2024-02-14 NOTE — Addendum Note (Signed)
 Addended by: RILLA BALLER on: 02/14/2024 10:35 AM   Modules accepted: Orders

## 2024-02-15 NOTE — Telephone Encounter (Signed)
Spoke with pt relaying Dr. G's message.  Pt verbalizes understanding and expresses her thanks.  

## 2024-02-17 ENCOUNTER — Ambulatory Visit
Admission: RE | Admit: 2024-02-17 | Discharge: 2024-02-17 | Disposition: A | Discharge status: Home / Self Care | Source: Ambulatory Visit | Attending: Family Medicine | Admitting: Family Medicine

## 2024-02-17 DIAGNOSIS — Z1231 Encounter for screening mammogram for malignant neoplasm of breast: Secondary | ICD-10-CM

## 2024-02-20 ENCOUNTER — Ambulatory Visit: Payer: Self-pay | Admitting: Family Medicine

## 2024-03-09 ENCOUNTER — Other Ambulatory Visit (INDEPENDENT_AMBULATORY_CARE_PROVIDER_SITE_OTHER): Payer: Federal, State, Local not specified - PPO

## 2024-03-09 ENCOUNTER — Ambulatory Visit: Payer: Self-pay | Admitting: Endocrinology

## 2024-03-09 DIAGNOSIS — E7800 Pure hypercholesterolemia, unspecified: Secondary | ICD-10-CM | POA: Diagnosis not present

## 2024-03-09 DIAGNOSIS — E890 Postprocedural hypothyroidism: Secondary | ICD-10-CM

## 2024-03-09 DIAGNOSIS — R7989 Other specified abnormal findings of blood chemistry: Secondary | ICD-10-CM | POA: Diagnosis not present

## 2024-03-09 LAB — BASIC METABOLIC PANEL WITH GFR
BUN: 17 mg/dL (ref 6–23)
CO2: 27 meq/L (ref 19–32)
Calcium: 9.2 mg/dL (ref 8.4–10.5)
Chloride: 105 meq/L (ref 96–112)
Creatinine, Ser: 0.97 mg/dL (ref 0.40–1.20)
GFR: 56.99 mL/min — ABNORMAL LOW (ref >60.00)
Glucose, Bld: 95 mg/dL (ref 70–99)
Potassium: 4.2 meq/L (ref 3.5–5.1)
Sodium: 140 meq/L (ref 135–145)

## 2024-03-09 LAB — LIPID PANEL
Cholesterol: 236 mg/dL — ABNORMAL HIGH (ref 0–200)
HDL: 47.4 mg/dL (ref >39.00)
LDL Cholesterol: 158 mg/dL — ABNORMAL HIGH (ref 0–99)
NonHDL: 188.19
Total CHOL/HDL Ratio: 5
Triglycerides: 151 mg/dL — ABNORMAL HIGH (ref 0.0–149.0)
VLDL: 30.2 mg/dL (ref 0.0–40.0)

## 2024-03-09 LAB — T4, FREE: Free T4: 0.94 ng/dL (ref 0.60–1.60)

## 2024-03-09 LAB — TSH: TSH: 0.39 u[IU]/mL (ref 0.35–5.50)

## 2024-03-09 LAB — VITAMIN B12: Vitamin B-12: 1218 pg/mL — ABNORMAL HIGH (ref 211–911)

## 2024-03-09 NOTE — Addendum Note (Signed)
 Addended by: ISADORA RAISIN on: 03/09/2024 08:06 AM   Modules accepted: Orders

## 2024-03-12 ENCOUNTER — Encounter: Payer: Self-pay | Admitting: Family Medicine

## 2024-03-12 ENCOUNTER — Ambulatory Visit: Payer: Self-pay | Admitting: Family Medicine

## 2024-03-12 DIAGNOSIS — E7800 Pure hypercholesterolemia, unspecified: Secondary | ICD-10-CM

## 2024-03-17 ENCOUNTER — Ambulatory Visit: Admitting: Endocrinology

## 2024-03-17 ENCOUNTER — Encounter: Payer: Self-pay | Admitting: Endocrinology

## 2024-03-17 VITALS — BP 128/60 | HR 85 | Resp 20 | Ht 59.0 in | Wt 154.2 lb

## 2024-03-17 DIAGNOSIS — E890 Postprocedural hypothyroidism: Secondary | ICD-10-CM

## 2024-03-17 DIAGNOSIS — Z8639 Personal history of other endocrine, nutritional and metabolic disease: Secondary | ICD-10-CM | POA: Diagnosis not present

## 2024-03-17 MED ORDER — LEVOTHYROXINE SODIUM 88 MCG PO TABS
88.0000 ug | ORAL_TABLET | Freq: Every day | ORAL | 4 refills | Status: DC
Start: 2024-03-17 — End: 2024-06-02

## 2024-03-17 NOTE — Progress Notes (Signed)
 Outpatient Endocrinology Note Kelsey Marlisha Vanwyk, MD  03/17/24  Patient's Name: Kelsey Yoder    DOB: 02-06-1946    MRN: [id number]  REASON OF VISIT: Follow-up for hypothyroidism  PCP: Rilla Baller, MD  HISTORY OF PRESENT ILLNESS:   Kelsey Yoder is a 78 y.o. old female with past medical history as listed below is presented for a follow up of postablative hypothyroidism.   Pertinent Thyroid  History: Patient has history of Graves' disease had positive thyrotropin receptor antibody, treated with RAI I-131 on October 24, 2016, for uptake was 82%.  She subsequently developed hypothyroid and was started on thyroid  hormone replacement in October 2018.  Levothyroxine  dose was adjusted over time.  She has been on levothyroxine  88 mcg daily since June 2023.   Interval history  Patient has been taking levothyroxine  88 mcg daily, takes in the morning and reports compliance.  She complains of not able to sleep, feeling heat intolerance and tired.  She also has been gaining weight slowly.  Discussed that these symptoms can be multifactorial.  No palpitation.  Recent thyroid  function test normal as follows.  No other complaints today.   Latest Reference Range & Units 03/09/24 08:04  TSH 0.35 - 5.50 uIU/mL 0.39  T4,Free(Direct) 0.60 - 1.60 ng/dL 9.05     REVIEW OF SYSTEMS:  As per history of present illness.   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   Cataract    Hyperlipidemia    Hyperthyroidism 01/07/2016   Dr Von, positive TSHR Ab   Osteopenia    PONV (postoperative nausea and vomiting)    post-op nausea   Sinus drainage     PAST SURGICAL HISTORY: Past Surgical History:  Procedure Laterality Date   ABDOMINAL HYSTERECTOMY  2004   BREAST BIOPSY Right 2010   benign    BREAST EXCISIONAL BIOPSY Right 2010   benign   CARPAL TUNNEL  RELEASE Right 04/25/2013   CARPAL TUNNEL RELEASE;  Surgeon: Franky JONELLE Curia, MD;  Location: [facility]   CARPAL TUNNEL RELEASE Left 09/24/2020   Procedure: LEFT CARPAL TUNNEL RELEASE;  Surgeon: Curia Franky, MD;  Location: [facility];  Service: Orthopedics;  Laterality: Left;  30 MINUTES   CATARACT EXTRACTION Bilateral 2013   COLONOSCOPY  2002   rectosigmoid bulge thought 2/2 fibroids   COLONOSCOPY  2005   polyps, diverticulosis, rpt 7-10 yrs ([facility])   COLONOSCOPY  08/2015   HP, mild diverticulosis, rpt 5 yrs  ([facility])   COLONOSCOPY  08/2021   diverticulosis, small int hem, no f/u recommended ([facility])   DENTAL SURGERY     dental implants   ESOPHAGOGASTRODUODENOSCOPY  2005   antral gastropathy, neg H pylori screen ([facility])   EYE SURGERY  2012   TONSILLECTOMY  1976   TOTAL ABDOMINAL HYSTERECTOMY W/ BILATERAL SALPINGOOPHORECTOMY  2008   h/o abnormal pap smears    ALLERGIES: No Known Allergies  FAMILY HISTORY:  Family History  Problem Relation Age of Onset   Diabetes Mother        double amputee   Congestive Heart Failure Mother    Diabetes Father    Breast cancer Sister    Cancer Sister    Stroke Sister    Stroke Sister 35   Arthritis Sister    Lung cancer Sister    Lupus Sister  Cancer Sister    Kidney disease Sister    Colon polyps Brother    Diabetes Brother    Cancer Brother        duodenal, prostate    Stroke Brother 9   CAD Brother 42       bypass   Stroke Maternal Grandmother    Stroke Maternal Grandfather    Colon cancer Neg Hx    Thyroid  disease Neg Hx    Rectal cancer Neg Hx    Stomach cancer Neg Hx    Esophageal cancer Neg Hx     SOCIAL HISTORY: Social History   Socioeconomic History   Marital status: Married    Spouse name: Not on file   Number of children: Not on file   Years of education: Not on file   Highest education level: Master's degree (e.g., MA, MS, MEng, MEd, MSW, MBA)  Occupational History    Not on file  Tobacco Use   Smoking status: Never   Smokeless tobacco: Never  Vaping Use   Vaping status: Never Used  Substance and Sexual Activity   Alcohol use: Never   Drug use: Never   Sexual activity: Yes    Partners: Male    Birth control/protection: Surgical, None  Other Topics Concern   Not on file  Social History Narrative   Caffeine: non   Lives alone, no pets   Occupation: retired, 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   Activity: goes to gym 3-4x/wk   Diet: some water, fruits and vegetables daily   Right handed    Social Drivers of Corporate investment banker Strain: Low Risk  (09/06/2023)   Overall Financial Resource Strain (CARDIA)    Difficulty of Paying Living Expenses: Not hard at all  Food Insecurity: No Food Insecurity (09/06/2023)   Hunger Vital Sign    Worried About Running Out of Food in the Last Year: Never true    Ran Out of Food in the Last Year: Never true  Transportation Needs: No Transportation Needs (09/06/2023)   PRAPARE - Transportation    Lack of Transportation (Medical): No    Lack of Transportation (Non-Medical): No  Physical Activity: Insufficiently Active (09/06/2023)   Exercise Vital Sign    Days of Exercise per Week: 3 days    Minutes of Exercise per Session: 40 min  Stress: Stress Concern Present (09/06/2023)   [employer] of Occupational Health - Occupational Stress Questionnaire    Feeling of Stress : To some extent  Social Connections: Socially Integrated (09/06/2023)   Social Connection and Isolation Panel    Frequency of Communication with Friends and Family: More than three times a week    Frequency of Social Gatherings with Friends and Family: Three times a week    Attends Religious Services: More than 4 times per year    Active Member of Clubs or Organizations: Yes    Attends Banker Meetings: More than 4 times per year    Marital Status: Married    MEDICATIONS:  Current Outpatient Medications   Medication Sig Dispense Refill   Calcium Carb-Cholecalciferol 600-800 MG-UNIT TABS Take 1 tablet by mouth daily.     Cholecalciferol (VITAMIN D3) 50 MCG (2000 UT) capsule Take 1 capsule by mouth daily.     ELDERBERRY PO Take by mouth daily at 12 noon.     gabapentin  (NEURONTIN ) 300 MG capsule Take 1 capsule (300 mg total) by mouth 2 (two) times daily as needed (neuropathy pain).  90 capsule 3   Multiple Vitamin (MULTIVITAMIN) tablet Take 1 tablet by mouth daily.     Turmeric 500 MG CAPS Take by mouth.     levothyroxine  (SYNTHROID ) 88 MCG tablet Take 1 tablet (88 mcg total) by mouth daily. 90 tablet 4   No current facility-administered medications for this visit.    PHYSICAL EXAM: Vitals:   03/17/24 1115  BP: 128/60  Pulse: 85  Resp: 20  SpO2: 98%  Weight: 154 lb 3.2 oz (69.9 kg)  Height: 4' 11 (1.499 m)   Body mass index is 31.14 kg/m.  Wt Readings from Last 3 Encounters:  03/17/24 154 lb 3.2 oz (69.9 kg)  09/15/23 156 lb (70.8 kg)  09/10/23 154 lb 2 oz (69.9 kg)     General: Well developed, well nourished female in no apparent distress.  HEENT: AT/[place], no external lesions. Hearing intact to the spoken word Eyes: No stare, proptosis or lid lag. Conjunctiva clear and no icterus. Neck: Trachea midline, neck supple without appreciable thyromegaly or lymphadenopathy and no palpable thyroid  nodules Abdomen: Soft, non tender Neurologic: Alert, oriented, normal speech, deep tendon biceps reflexes normal,  no gross focal neurological deficit Extremities: No pedal pitting edema, no tremors of outstretched hands Skin: Warm, color good.  Psychiatric: Does not appear depressed or anxious  PERTINENT HISTORIC LABORATORY AND IMAGING STUDIES:  All pertinent laboratory results were reviewed. Please see HPI also for further details.   TSH  Date Value Ref Range Status  03/09/2024 0.39 0.35 - 5.50 uIU/mL Final  09/04/2023 2.44 0.35 - 5.50 uIU/mL Final  03/12/2023 0.58 0.35 - 5.50 uIU/mL  Final   T3, Total  Date Value Ref Range Status  01/03/2016 271.0 (H) 76 - 181 ng/dL Final     ASSESSMENT / PLAN  1. Postablative hypothyroidism   2. H/O Graves' disease    -Patient was treated with radioactive iodine I-131 ablation in April 2018 and subsequently developed postablative hypothyroidism from October 2018. -Currently taking levothyroxine  88 mcg daily. - Recent thyroid  function test normal.  Plan: -Continue current dose of levothyroxine  88 mcg daily. -Patient prefers to complete lab prior to follow-up visit at [facility] lab, order placed. - Consider annual endocrinology follow-up after next time.  Diagnoses and all orders for this visit:  Postablative hypothyroidism -     T4, free -     TSH -     levothyroxine  (SYNTHROID ) 88 MCG tablet; Take 1 tablet (88 mcg total) by mouth daily.  H/O Graves' disease    DISPOSITION Follow up in clinic in 6 months suggested.  Labs prior to follow-up visit.  All questions answered and patient verbalized understanding of the plan.  Kelsey Gyneth Hubka, MD [facility] Endocrinology [facility] [address] [place], [address] [place], [place] [address] Phone # [phone]  At least part of this note was generated using voice recognition software. Inadvertent word errors may have occurred, which were not recognized during the proofreading process.

## 2024-03-18 ENCOUNTER — Other Ambulatory Visit: Payer: Self-pay

## 2024-03-18 DIAGNOSIS — Z8639 Personal history of other endocrine, nutritional and metabolic disease: Secondary | ICD-10-CM

## 2024-03-18 DIAGNOSIS — E890 Postprocedural hypothyroidism: Secondary | ICD-10-CM

## 2024-03-27 NOTE — Telephone Encounter (Signed)
 Ordered cardiac CT to [facility].

## 2024-04-03 ENCOUNTER — Other Ambulatory Visit: Payer: Self-pay | Admitting: Family Medicine

## 2024-04-03 DIAGNOSIS — R7989 Other specified abnormal findings of blood chemistry: Secondary | ICD-10-CM

## 2024-04-03 DIAGNOSIS — E7800 Pure hypercholesterolemia, unspecified: Secondary | ICD-10-CM

## 2024-04-08 ENCOUNTER — Other Ambulatory Visit

## 2024-06-02 ENCOUNTER — Other Ambulatory Visit: Payer: Self-pay | Admitting: Endocrinology

## 2024-06-02 DIAGNOSIS — E890 Postprocedural hypothyroidism: Secondary | ICD-10-CM

## 2024-06-02 NOTE — Telephone Encounter (Signed)
 Refill request complete

## 2024-06-23 ENCOUNTER — Ambulatory Visit (HOSPITAL_COMMUNITY)
Admission: RE | Admit: 2024-06-23 | Discharge: 2024-06-23 | Disposition: A | Discharge status: Home / Self Care | Payer: Self-pay | Source: Ambulatory Visit | Attending: Family Medicine | Admitting: Family Medicine

## 2024-06-23 DIAGNOSIS — E7800 Pure hypercholesterolemia, unspecified: Secondary | ICD-10-CM | POA: Insufficient documentation

## 2024-06-24 ENCOUNTER — Other Ambulatory Visit (HOSPITAL_COMMUNITY)

## 2024-06-27 ENCOUNTER — Ambulatory Visit: Payer: Self-pay | Admitting: Family Medicine

## 2024-06-27 DIAGNOSIS — E7800 Pure hypercholesterolemia, unspecified: Secondary | ICD-10-CM

## 2024-06-27 DIAGNOSIS — R931 Abnormal findings on diagnostic imaging of heart and coronary circulation: Secondary | ICD-10-CM | POA: Insufficient documentation

## 2024-09-06 ENCOUNTER — Other Ambulatory Visit

## 2024-09-12 ENCOUNTER — Encounter: Admitting: Family Medicine

## 2024-09-12 ENCOUNTER — Ambulatory Visit: Admitting: Endocrinology

## 2024-09-13 ENCOUNTER — Ambulatory Visit: Admitting: Endocrinology
